# Patient Record
Sex: Female | Born: 1950
Health system: Southern US, Community
[De-identification: ages and names within clinical notes are randomized; demographics above are authoritative.]

## PROBLEM LIST (undated history)

## (undated) DIAGNOSIS — K219 Gastro-esophageal reflux disease without esophagitis: Secondary | ICD-10-CM

## (undated) DIAGNOSIS — M1990 Unspecified osteoarthritis, unspecified site: Secondary | ICD-10-CM

## (undated) DIAGNOSIS — I219 Acute myocardial infarction, unspecified: Secondary | ICD-10-CM

## (undated) DIAGNOSIS — N029 Recurrent and persistent hematuria with unspecified morphologic changes: Secondary | ICD-10-CM

## (undated) DIAGNOSIS — G43909 Migraine, unspecified, not intractable, without status migrainosus: Secondary | ICD-10-CM

## (undated) HISTORY — DX: Acute myocardial infarction, unspecified: I21.9

## (undated) HISTORY — PX: ABDOMINAL HYSTERECTOMY: SHX81

## (undated) HISTORY — DX: Gastro-esophageal reflux disease without esophagitis: K21.9

## (undated) HISTORY — DX: Unspecified osteoarthritis, unspecified site: M19.90

## (undated) HISTORY — PX: APPENDECTOMY: SHX54

---

## 1997-12-11 ENCOUNTER — Ambulatory Visit (HOSPITAL_COMMUNITY): Admission: RE | Admit: 1997-12-11 | Discharge: 1997-12-11 | Payer: Self-pay | Admitting: Urology

## 1999-01-22 ENCOUNTER — Other Ambulatory Visit: Admission: RE | Admit: 1999-01-22 | Discharge: 1999-01-22 | Payer: Self-pay | Admitting: Obstetrics and Gynecology

## 2000-03-30 ENCOUNTER — Other Ambulatory Visit: Admission: RE | Admit: 2000-03-30 | Discharge: 2000-03-30 | Payer: Self-pay | Admitting: Obstetrics and Gynecology

## 2001-04-25 ENCOUNTER — Other Ambulatory Visit: Admission: RE | Admit: 2001-04-25 | Discharge: 2001-04-25 | Payer: Self-pay | Admitting: *Deleted

## 2002-04-17 ENCOUNTER — Other Ambulatory Visit: Admission: RE | Admit: 2002-04-17 | Discharge: 2002-04-17 | Payer: Self-pay | Admitting: *Deleted

## 2002-04-30 ENCOUNTER — Encounter: Payer: Self-pay | Admitting: *Deleted

## 2002-04-30 ENCOUNTER — Encounter: Admission: RE | Admit: 2002-04-30 | Discharge: 2002-04-30 | Payer: Self-pay | Admitting: *Deleted

## 2002-06-25 ENCOUNTER — Ambulatory Visit (HOSPITAL_COMMUNITY): Admission: RE | Admit: 2002-06-25 | Discharge: 2002-06-25 | Payer: Self-pay | Admitting: Gastroenterology

## 2007-03-23 ENCOUNTER — Ambulatory Visit (HOSPITAL_COMMUNITY): Admission: RE | Admit: 2007-03-23 | Discharge: 2007-03-23 | Payer: Self-pay | Admitting: Obstetrics and Gynecology

## 2007-05-18 ENCOUNTER — Encounter: Admission: RE | Admit: 2007-05-18 | Discharge: 2007-05-18 | Payer: Self-pay | Admitting: Family Medicine

## 2007-07-20 ENCOUNTER — Ambulatory Visit: Payer: Self-pay | Admitting: Vascular Surgery

## 2007-07-20 ENCOUNTER — Ambulatory Visit: Admission: RE | Admit: 2007-07-20 | Discharge: 2007-07-20 | Payer: Self-pay | Admitting: Family Medicine

## 2007-07-25 ENCOUNTER — Encounter: Payer: Self-pay | Admitting: Family Medicine

## 2008-11-29 ENCOUNTER — Ambulatory Visit (HOSPITAL_COMMUNITY): Admission: RE | Admit: 2008-11-29 | Discharge: 2008-11-29 | Payer: Self-pay | Admitting: Family Medicine

## 2010-10-04 ENCOUNTER — Encounter: Payer: Self-pay | Admitting: Interventional Radiology
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
## 2011-01-29 NOTE — Op Note (Signed)
   NAME:  Lisa, Moore                       ACCOUNT NO.:  [id number]   MEDICAL RECORD NO.:  [id number]                   PATIENT TYPE:  AMB   LOCATION:  ENDO                                 FACILITY:  [facility]   PHYSICIAN:  Danise Edge, M.D.                DATE OF BIRTH:  1951/07/30   DATE OF PROCEDURE:  06/25/2002  DATE OF DISCHARGE:                                 OPERATIVE REPORT   PROCEDURE:  Screening colonoscopy.   INDICATIONS:  The patient is a 60 year old female born [birth year].  The patient  is scheduled to undergo her first screening colonoscopy with polypectomy to  prevent colon cancer.  I discussed with her the complications associated  with colonoscopy and polypectomy, including a 15 per thousand risk of  bleeding and four per thousand risk of colon perforation requiring surgical  repair.  The patient has signed the operative permit.   ENDOSCOPIST:  Danise Edge, M.D.   PREMEDICATION:  Versed 7 mg, Demerol 50 mg.   ENDOSCOPE:  Olympus pediatric colonoscope.   DESCRIPTION OF PROCEDURE:  After obtaining informed consent, the patient was  placed in the left lateral decubitus position.  I administered intravenous  Demerol and intravenous Versed to achieve conscious sedation for the  procedure.  The patient's blood pressure, oxygen saturation, and cardiac  rhythm were monitored throughout the procedure and documented in the medical  record.   Anal inspection was normal.  Digital rectal inspection was normal.  The  Olympus pediatric video colonoscope was introduced into the rectum and  advanced to the cecum.  Colonic preparation for the exam today was  satisfactory.   Rectum normal.   Sigmoid colon and descending colon normal.   Splenic flexure normal.   Transverse colon normal.   Hepatic flexure normal.   Ascending colon normal.   Cecum and ileocecal valve normal.   ASSESSMENT:  Normal screening proctocolonoscopy to the cecum.                               Danise Edge, M.D.    MJ/MEDQ  D:  06/25/2002  T:  06/25/2002  Job:  [id number]   cc:   Armstead Peaks, M.D.  [address] [place], [place] [address]  Fax: [phone]

## 2011-09-17 ENCOUNTER — Other Ambulatory Visit: Payer: Self-pay | Admitting: Family Medicine

## 2011-09-17 DIAGNOSIS — M549 Dorsalgia, unspecified: Secondary | ICD-10-CM

## 2011-09-20 ENCOUNTER — Ambulatory Visit
Admission: RE | Admit: 2011-09-20 | Discharge: 2011-09-20 | Disposition: A | Discharge status: Home / Self Care | Payer: 59 | Source: Ambulatory Visit | Attending: Family Medicine | Admitting: Family Medicine

## 2011-09-20 DIAGNOSIS — M549 Dorsalgia, unspecified: Secondary | ICD-10-CM

## 2011-09-20 MED ORDER — METHYLPREDNISOLONE ACETATE 40 MG/ML INJ SUSP (RADIOLOG
120.0000 mg | Freq: Once | INTRAMUSCULAR | Status: AC
  Administered 2011-09-20: 120 mg via EPIDURAL

## 2011-09-20 MED ORDER — IOHEXOL 180 MG/ML  SOLN
1.0000 mL | Freq: Once | INTRAMUSCULAR | Status: AC | PRN
  Administered 2011-09-20: 1 mL via EPIDURAL

## 2014-01-31 ENCOUNTER — Emergency Department (HOSPITAL_BASED_OUTPATIENT_CLINIC_OR_DEPARTMENT_OTHER): Payer: 59

## 2014-01-31 ENCOUNTER — Emergency Department (HOSPITAL_BASED_OUTPATIENT_CLINIC_OR_DEPARTMENT_OTHER)
Admission: EM | Admit: 2014-01-31 | Discharge: 2014-01-31 | Disposition: A | Discharge status: Home / Self Care | Payer: 59 | Attending: Emergency Medicine | Admitting: Emergency Medicine

## 2014-01-31 ENCOUNTER — Emergency Department: Payer: 59

## 2014-01-31 ENCOUNTER — Encounter (HOSPITAL_BASED_OUTPATIENT_CLINIC_OR_DEPARTMENT_OTHER): Payer: Self-pay | Admitting: Emergency Medicine

## 2014-01-31 DIAGNOSIS — R079 Chest pain, unspecified: Secondary | ICD-10-CM

## 2014-01-31 DIAGNOSIS — R0789 Other chest pain: Secondary | ICD-10-CM | POA: Insufficient documentation

## 2014-01-31 DIAGNOSIS — R319 Hematuria, unspecified: Secondary | ICD-10-CM | POA: Insufficient documentation

## 2014-01-31 DIAGNOSIS — R071 Chest pain on breathing: Secondary | ICD-10-CM | POA: Insufficient documentation

## 2014-01-31 DIAGNOSIS — R1011 Right upper quadrant pain: Secondary | ICD-10-CM | POA: Insufficient documentation

## 2014-01-31 HISTORY — DX: Recurrent and persistent hematuria with unspecified morphologic changes: N02.9

## 2014-01-31 LAB — URINALYSIS, ROUTINE W REFLEX MICROSCOPIC
BILIRUBIN URINE: NEGATIVE
Glucose, UA: NEGATIVE mg/dL
Ketones, ur: NEGATIVE mg/dL
Nitrite: NEGATIVE
PROTEIN: NEGATIVE mg/dL
Specific Gravity, Urine: 1.022 (ref 1.005–1.030)
UROBILINOGEN UA: 1 mg/dL (ref 0.0–1.0)
pH: 6 (ref 5.0–8.0)

## 2014-01-31 LAB — CBC WITH DIFFERENTIAL/PLATELET
BASOS ABS: 0 10*3/uL (ref 0.0–0.1)
Basophils Relative: 0 % (ref 0–1)
Eosinophils Absolute: 0.1 10*3/uL (ref 0.0–0.7)
Eosinophils Relative: 1 % (ref 0–5)
HEMATOCRIT: 40.5 % (ref 36.0–46.0)
HEMOGLOBIN: 13.9 g/dL (ref 12.0–15.0)
LYMPHS PCT: 36 % (ref 12–46)
Lymphs Abs: 3.2 10*3/uL (ref 0.7–4.0)
MCH: 31 pg (ref 26.0–34.0)
MCHC: 34.3 g/dL (ref 30.0–36.0)
MCV: 90.4 fL (ref 78.0–100.0)
MONO ABS: 0.7 10*3/uL (ref 0.1–1.0)
MONOS PCT: 7 % (ref 3–12)
NEUTROS ABS: 5 10*3/uL (ref 1.7–7.7)
Neutrophils Relative %: 56 % (ref 43–77)
Platelets: 275 10*3/uL (ref 150–400)
RBC: 4.48 MIL/uL (ref 3.87–5.11)
RDW: 13.1 % (ref 11.5–15.5)
WBC: 8.9 10*3/uL (ref 4.0–10.5)

## 2014-01-31 LAB — COMPREHENSIVE METABOLIC PANEL
ALK PHOS: 72 U/L (ref 39–117)
ALT: 11 U/L (ref 0–35)
AST: 16 U/L (ref 0–37)
Albumin: 3.7 g/dL (ref 3.5–5.2)
BILIRUBIN TOTAL: 0.5 mg/dL (ref 0.3–1.2)
BUN: 17 mg/dL (ref 6–23)
CHLORIDE: 106 meq/L (ref 96–112)
CO2: 24 meq/L (ref 19–32)
CREATININE: 0.8 mg/dL (ref 0.50–1.10)
Calcium: 9.3 mg/dL (ref 8.4–10.5)
GFR calc Af Amer: 88 mL/min — ABNORMAL LOW (ref >90)
GFR, EST NON AFRICAN AMERICAN: 76 mL/min — AB (ref >90)
Glucose, Bld: 113 mg/dL — ABNORMAL HIGH (ref 70–99)
POTASSIUM: 3.6 meq/L — AB (ref 3.7–5.3)
Sodium: 143 mEq/L (ref 137–147)
Total Protein: 7 g/dL (ref 6.0–8.3)

## 2014-01-31 LAB — URINE MICROSCOPIC-ADD ON

## 2014-01-31 LAB — TROPONIN I: Troponin I: <0.3 ng/mL (ref <0.30)

## 2014-01-31 NOTE — Discharge Instructions (Signed)

## 2014-01-31 NOTE — ED Provider Notes (Addendum)
CSN: [id number]     Arrival date & time 01/31/14  1239 History   First MD Initiated Contact with Patient 01/31/14 1326     Chief Complaint  Patient presents with  . Emesis     (Consider location/radiation/quality/duration/timing/severity/associated sxs/prior Treatment) Patient is a 63 y.o. female presenting with vomiting. The history is provided by the patient.  Emesis Severity:  Mild Duration:  3 hours Timing:  Rare Number of daily episodes:  1 Quality:  Stomach contents Progression:  Resolved Chronicity:  New Recent urination:  Normal Relieved by:  None tried Worsened by:  Nothing tried Ineffective treatments:  None tried Associated symptoms: no chills, no cough, no diarrhea, no fever, no myalgias and no sore throat   Associated symptoms comment:  Pain in the RUQ/flank and lower chest wall that started abruptly at 10:30 and lasted about was sharp in nature caused 1 episode of vomiting and then resolved. Risk factors: no alcohol use, no diabetes, no prior abdominal surgery and no sick contacts     Past Medical History  Diagnosis Date  . Benign hematuria    Past Surgical History  Procedure Laterality Date  . Abdominal hysterectomy    . Appendectomy     No family history on file. History  Substance Use Topics  . Smoking status: Never Smoker   . Smokeless tobacco: Never Used  . Alcohol Use: No   OB History   Grav Para Term Preterm Abortions TAB SAB Ect Mult Living                 Review of Systems  Constitutional: Negative for chills.  HENT: Negative for sore throat.   Gastrointestinal: Positive for vomiting. Negative for diarrhea.  Musculoskeletal: Negative for myalgias.  All other systems reviewed and are negative.     Allergies  Review of patient's allergies indicates no known allergies.  Home Medications   Prior to Admission medications   Not on File   BP 142/84  Pulse 79  Temp(Src) 98.9 F (37.2 C) (Oral)  Resp 20  Ht 5\' 6"  (1.676 m)   Wt 245 lb (111.131 kg)  BMI 39.56 kg/m2  SpO2 98% Physical Exam  Nursing note and vitals reviewed. Constitutional: She is oriented to person, place, and time. She appears well-developed and well-nourished. No distress.  HENT:  Head: Normocephalic and atraumatic.  Mouth/Throat: Oropharynx is clear and moist.  Eyes: Conjunctivae and EOM are normal. Pupils are equal, round, and reactive to light.  Neck: Normal range of motion. Neck supple.  Cardiovascular: Normal rate, regular rhythm and intact distal pulses.   No murmur heard. Pulmonary/Chest: Effort normal and breath sounds normal. No respiratory distress. She has no wheezes. She has no rales. She exhibits no tenderness.  Abdominal: Soft. Normal appearance. She exhibits no distension. There is no tenderness. There is no rebound, no guarding and no CVA tenderness.  Musculoskeletal: Normal range of motion. She exhibits no edema and no tenderness.  Neurological: She is alert and oriented to person, place, and time.  Skin: Skin is warm and dry. No rash noted. No erythema.  Psychiatric: She has a normal mood and affect. Her behavior is normal.    ED Course  Procedures (including critical care time) Labs Review Labs Reviewed  URINALYSIS, ROUTINE W REFLEX MICROSCOPIC - Abnormal; Notable for the following:    Hgb urine dipstick LARGE (*)    Leukocytes, UA SMALL (*)    All other components within normal limits  COMPREHENSIVE METABOLIC PANEL - Abnormal;  Notable for the following:    Potassium 3.6 (*)    Glucose, Bld 113 (*)    GFR calc non Af Amer 76 (*)    GFR calc Af Amer 88 (*)    All other components within normal limits  URINE MICROSCOPIC-ADD ON  CBC WITH DIFFERENTIAL  TROPONIN I    Imaging Review Dg Chest 2 View  01/31/2014   CLINICAL DATA:  Chest pain.  EXAM: CHEST  2 VIEW  COMPARISON:  None.  FINDINGS: The heart size and mediastinal contours are within normal limits. Both lungs are clear. The visualized skeletal structures are  unremarkable.  IMPRESSION: Normal chest.   Electronically Signed   By: Geanie CooleyJim  Maxwell M.D.   On: 01/31/2014 14:54   [place] Abdomen Complete  01/31/2014   CLINICAL DATA:  Right upper quadrant pain with nausea and vomiting.  EXAM: ULTRASOUND ABDOMEN COMPLETE  COMPARISON:  None.  FINDINGS: Gallbladder:  No gallstones or wall thickening visualized. No sonographic Murphy sign noted.  Common bile duct:  Diameter: 3.5 mm, normal.  Liver:  No focal lesion identified. Within normal limits in parenchymal echogenicity.  IVC:  No abnormality visualized.  Pancreas:  Normal.  Spleen:  9.0 cm, normal.  Right Kidney:  Length: 10.1 cm. Normal echogenicity of the renal parenchyma. There are 2 cysts in the lateral aspect of the mid right kidney, 1 measuring 2.7 cm and the other being 3.6 cm. No hydronephrosis or solid mass.  Left Kidney:  Length: 11.4 cm. 2.4 cm hyperechoic mass in the upper pole of the left kidney. There was a fat density 16 mm lesion in that area on the CT scan of 11/26/2005. This probably represents an angio myolipoma. Renal parenchyma is otherwise normal. No hydronephrosis visualized.  Abdominal aorta:  Normal.  2.5 cm diameter.  Other findings:  None.  IMPRESSION: 1. No acute abnormalities. 2. Probable angiomyolipoma of the left kidney in the upper pole, increased from 1.6 cm to 2.4 cm since 2007.   Electronically Signed   By: Geanie CooleyJim  Maxwell M.D.   On: 01/31/2014 14:41     EKG Interpretation   Date/Time:  Thursday Jan 31 2014 14:29:09 EDT Ventricular Rate:  62 PR Interval:  164 QRS Duration: 74 QT Interval:  416 QTC Calculation: 422 R Axis:   44 Text Interpretation:  Normal sinus rhythm Low voltage QRS Otherwise within  normal limits No previous tracing Confirmed by Anitra LauthPLUNKETT  MD, Alphonzo LemmingsWHITNEY  [phone]([id number]) on 01/31/2014 2:41:52 PM      MDM   Final diagnoses:  Atypical chest pain    Patient presents after an abrupt onset of pain that lasted for approximately 10 minutes that was sharp in nature caused  episode of vomiting and has now resolved. The pain is located in the right upper quadrant/flank/lower chest area. It did not radiate she's never had symptoms like this before. Low Suspicion for cardiac etiology at this time as patient has no risk factors. She has no risk factors for PE and low suspicion based on her story. Possibility for gallbladder pathology versus kidney stone. Patient does have a small amount of blood in her urine and denies any vaginal bleeding.  Right upper quadrant ultrasound to evaluate the gallbladder the kidneys, chest x-ray, CBC, CMP, troponin pending. The patient at length over 3 hours after symptoms had occurred so troponin is negative for the patient adequately ruled out given her heart score of 0  Imaging and labs wnl. Pt continues to feel her baseline.  She states  she has chronic microscopic hematuria that has been evaluated by cystoscopy in the past with inconclusive resolved.  Will d/c pt home at this time.  Gwyneth SproutWhitney Balbina Depace, MD 01/31/14 1504  Gwyneth SproutWhitney Marija Calamari, MD 01/31/14 1505

## 2014-01-31 NOTE — ED Notes (Signed)
Patient states she was at work and had a sudden onset on pain in her right upper side and under her right breast.  States she suddenly became nauseated and vomited x 1.  States she immediately felt better.  Pain and nausea have subsided.  Now has a headache.  EMS was called and an ekg was done.

## 2016-08-16 ENCOUNTER — Other Ambulatory Visit: Payer: Self-pay | Admitting: Family

## 2016-08-16 DIAGNOSIS — E2839 Other primary ovarian failure: Secondary | ICD-10-CM

## 2016-09-03 ENCOUNTER — Emergency Department (HOSPITAL_COMMUNITY): Payer: Medicare Other

## 2016-09-03 ENCOUNTER — Encounter (HOSPITAL_COMMUNITY): Payer: Self-pay | Admitting: Emergency Medicine

## 2016-09-03 ENCOUNTER — Emergency Department (HOSPITAL_COMMUNITY)
Admission: EM | Admit: 2016-09-03 | Discharge: 2016-09-03 | Disposition: A | Discharge status: Home / Self Care | Payer: Medicare Other | Attending: Emergency Medicine | Admitting: Emergency Medicine

## 2016-09-03 DIAGNOSIS — Z79899 Other long term (current) drug therapy: Secondary | ICD-10-CM | POA: Insufficient documentation

## 2016-09-03 DIAGNOSIS — R1013 Epigastric pain: Secondary | ICD-10-CM

## 2016-09-03 DIAGNOSIS — R110 Nausea: Secondary | ICD-10-CM | POA: Diagnosis not present

## 2016-09-03 DIAGNOSIS — R1011 Right upper quadrant pain: Secondary | ICD-10-CM | POA: Diagnosis present

## 2016-09-03 DIAGNOSIS — R109 Unspecified abdominal pain: Secondary | ICD-10-CM

## 2016-09-03 LAB — URINALYSIS, ROUTINE W REFLEX MICROSCOPIC
GLUCOSE, UA: NEGATIVE mg/dL
Ketones, ur: 5 mg/dL — AB
NITRITE: NEGATIVE
PH: 5 (ref 5.0–8.0)
Protein, ur: 30 mg/dL — AB
SPECIFIC GRAVITY, URINE: 1.029 (ref 1.005–1.030)

## 2016-09-03 LAB — COMPREHENSIVE METABOLIC PANEL
ALBUMIN: 4.7 g/dL (ref 3.5–5.0)
ALK PHOS: 76 U/L (ref 38–126)
ALT: 14 U/L (ref 14–54)
ANION GAP: 10 (ref 5–15)
AST: 23 U/L (ref 15–41)
BILIRUBIN TOTAL: 0.7 mg/dL (ref 0.3–1.2)
BUN: 30 mg/dL — ABNORMAL HIGH (ref 6–20)
CALCIUM: 9.5 mg/dL (ref 8.9–10.3)
CO2: 21 mmol/L — AB (ref 22–32)
CREATININE: 0.89 mg/dL (ref 0.44–1.00)
Chloride: 108 mmol/L (ref 101–111)
GFR calc non Af Amer: >60 mL/min (ref >60)
GLUCOSE: 108 mg/dL — AB (ref 65–99)
Potassium: 3.9 mmol/L (ref 3.5–5.1)
Sodium: 139 mmol/L (ref 135–145)
TOTAL PROTEIN: 7.7 g/dL (ref 6.5–8.1)

## 2016-09-03 LAB — CBC
HCT: 42.3 % (ref 36.0–46.0)
HEMOGLOBIN: 14.3 g/dL (ref 12.0–15.0)
MCH: 29.5 pg (ref 26.0–34.0)
MCHC: 33.8 g/dL (ref 30.0–36.0)
MCV: 87.4 fL (ref 78.0–100.0)
PLATELETS: 302 10*3/uL (ref 150–400)
RBC: 4.84 MIL/uL (ref 3.87–5.11)
RDW: 13.8 % (ref 11.5–15.5)
WBC: 7.2 10*3/uL (ref 4.0–10.5)

## 2016-09-03 LAB — TROPONIN I: Troponin I: <0.03 ng/mL (ref <0.03)

## 2016-09-03 LAB — LIPASE, BLOOD: Lipase: 23 U/L (ref 11–51)

## 2016-09-03 MED ORDER — RANITIDINE HCL 150 MG PO TABS: 150.0000 mg | ORAL_TABLET | Freq: Two times a day (BID) | ORAL | 0 refills | Status: DC

## 2016-09-03 MED ORDER — SUCRALFATE 1 GM/10ML PO SUSP: 1.0000 g | Freq: Three times a day (TID) | ORAL | 0 refills | Status: DC

## 2016-09-03 NOTE — Discharge Instructions (Signed)
Please read the attached information on "abdominal pain in adults", "fatty liver" and "nonalcoholic fatty liver disease diet".   Your ultrasound was negative for gallbladder stones and gallbladder inflammation or infection. As we discussed there was an area of increased echogenicity in the liver adjacent to the gallbladder found on the ultrasound.  Please discuss these results with your primary care provider or with the GI provider you follow up with.   The ultrasound report is attached to your discharge paperwork.   You have been prescribed zantac and carafate suspension, these medication may decrease reflux and alleviate discomfort.  Take these as prescribed.    Return to the emergency department as needed, if you develop vomiting, bloody stools or worsening abdominal pain.

## 2016-09-03 NOTE — ED Triage Notes (Signed)
Pt reports R flank/RUQ abd pain for the past month that has gradually gotten worse. Still has gallbladder. No v/d or dysuria.

## 2016-09-03 NOTE — ED Notes (Signed)
US at bedside

## 2016-09-03 NOTE — ED Provider Notes (Signed)
[facility]-EMERGENCY DEPT Provider Note   CSN: [id number] Arrival date & time: 09/03/16  1256     History   Chief Complaint Chief Complaint  Patient presents with  . Abdominal Pain    HPI Lisa Moore is a 65 y.o. female with h/o reflux presents with R upper abdominal pain that radiates to the R shoulder blade and R chest x 1 week.  Pt reports associated nausea and constipation.  Pt states abdominal pain is worse at night and after fatty meals.  Pt states pain has woken her up in the middle of the night.  Pt reports reflux has gotten worse recently.  Pt denies shortness of breath, cough, upper respiratory symptoms.  Pt denies vomiting, diarrhea, fevers, recent unexpected weight loss.  Pt denies heart disease or recent lower extremity swelling.  Pt denies h/o PUD, pancreatitis, kidney stones.  Pt denies heavy alcohol intake or ibuprofen overuse.   HPI  Past Medical History:  Diagnosis Date  . Benign hematuria     There are no active problems to display for this patient.   Past Surgical History:  Procedure Laterality Date  . ABDOMINAL HYSTERECTOMY    . APPENDECTOMY      OB History    No data available       Home Medications    Prior to Admission medications   Medication Sig Start Date End Date Taking? Authorizing Provider  naproxen sodium (ANAPROX) 220 MG tablet Take 660 mg by mouth 2 (two) times daily as needed (for pain).   Yes Historical Provider, MD  ranitidine (ZANTAC) 150 MG tablet Take 1 tablet (150 mg total) by mouth 2 (two) times daily. 09/03/16   Liberty Handylaudia J Shandell Jallow, PA-C  sucralfate (CARAFATE) 1 GM/10ML suspension Take 10 mLs (1 g total) by mouth 4 (four) times daily -  with meals and at bedtime. 09/03/16   Liberty Handylaudia J Melisa Donofrio, PA-C    Family History History reviewed. No pertinent family history.  Social History Social History  Substance Use Topics  . Smoking status: Never Smoker  . Smokeless tobacco: Never Used  . Alcohol use No     Allergies   Patient  has no known allergies.   Review of Systems Review of Systems  Constitutional: Negative for fever.  HENT: Negative for congestion and rhinorrhea.   Eyes: Negative for visual disturbance.  Respiratory: Negative for cough, choking, chest tightness and shortness of breath.   Cardiovascular: Positive for chest pain. Negative for palpitations and leg swelling.  Gastrointestinal: Positive for abdominal pain, constipation and nausea. Negative for anal bleeding, blood in stool, diarrhea and vomiting.  Genitourinary: Negative for difficulty urinating.  Musculoskeletal: Negative for arthralgias.  Skin: Negative for rash.  Neurological: Negative for dizziness, weakness, light-headedness and headaches.  Hematological: Negative.   Psychiatric/Behavioral: Negative.      Physical Exam Updated Vital Signs BP 141/83 (BP Location: Left Arm)   Pulse 63   Temp 97.9 F (36.6 C) (Oral)   Resp 16   Ht 5\' 5"  (1.651 m)   Wt 103.9 kg   SpO2 96%   BMI 38.11 kg/m   Physical Exam  Constitutional: She is oriented to person, place, and time. Vital signs are normal. She appears well-developed and well-nourished. No distress.  HENT:  Head: Normocephalic and atraumatic.  Nose: Nose normal.  Mouth/Throat: Oropharynx is clear and moist. No oropharyngeal exudate.  Eyes: Conjunctivae and EOM are normal. Pupils are equal, round, and reactive to light.  Neck: Normal range of motion. Neck supple.  No JVD present.  Cardiovascular: Normal rate, regular rhythm, normal heart sounds and intact distal pulses.   No murmur heard. Pulmonary/Chest: Effort normal and breath sounds normal. She has no wheezes.  Abdominal:  Abdomen is soft, non tender, non distended, without guarding or rigidity.  Mild epigastric tenderness.  Negative Murphy's.  Negative McMurphy's.  Open appendectomy surgical scar.  No pulsating abdominal masses. No suprapubic tenderness. No CVAT bilaterally.   Musculoskeletal: Normal range of motion.    Lymphadenopathy:    She has no cervical adenopathy.  Neurological: She is alert and oriented to person, place, and time.  Skin: Skin is warm and dry. Capillary refill takes less than 2 seconds.  Psychiatric: She has a normal mood and affect. Her behavior is normal.  Nursing note and vitals reviewed.    ED Treatments / Results  Labs (all labs ordered are listed, but only abnormal results are displayed) Labs Reviewed  COMPREHENSIVE METABOLIC PANEL - Abnormal; Notable for the following:       Result Value   CO2 21 (*)    Glucose, Bld 108 (*)    BUN 30 (*)    All other components within normal limits  URINALYSIS, ROUTINE W REFLEX MICROSCOPIC - Abnormal; Notable for the following:    Color, Urine AMBER (*)    APPearance HAZY (*)    Hgb urine dipstick MODERATE (*)    Bilirubin Urine SMALL (*)    Ketones, ur 5 (*)    Protein, ur 30 (*)    Leukocytes, UA MODERATE (*)    Bacteria, UA RARE (*)    Squamous Epithelial / LPF 6-30 (*)    All other components within normal limits  LIPASE, BLOOD  CBC  TROPONIN I    EKG  EKG Interpretation None       Radiology Dg Chest 2 View  Result Date: 09/03/2016 CLINICAL DATA:  Pain under RIGHT breast and upper posterior shoulder pain recurring for 1 week, vague upper abdominal pain EXAM: CHEST  2 VIEW COMPARISON:  01/31/2014 FINDINGS: Normal heart size and pulmonary vascularity. Tortuous descending thoracic aorta. Lungs clear. No pleural effusion or pneumothorax. Osseous structures demineralized without acute abnormality. IMPRESSION: No acute abnormalities. Electronically Signed   By: Ulyses SouthwardMark  Boles M.D.   On: 09/03/2016 17:15   Koreas Abdomen Limited Ruq  Result Date: 09/03/2016 CLINICAL DATA:  Right upper quadrant pain for 1 week. EXAM: US ABDOMEN LIMITED - RIGHT UPPER QUADRANT COMPARISON:  01/31/2014 FINDINGS: Gallbladder: No gallstones or wall thickening visualized. No sonographic Murphy sign noted by sonographer. Common bile duct: Diameter:  2.8 mm Liver: Diffusely increased echogenicity of the liver with focal area of hypoattenuation adjacent to the gallbladder fossa, ill-defined. Benign-appearing right renal cysts. IMPRESSION: No evidence of cholelithiasis or acute cholecystitis. Diffusely increased echogenicity of the liver with focal area of hypoattenuation adjacent to gallbladder fossa. This may represent an area of fatty sparing in the background of hepatic steatosis or potentially a hepatic mass. Please correlate clinically. Electronically Signed   By: Ted Mcalpineobrinka  Dimitrova M.D.   On: 09/03/2016 16:51    Procedures Procedures (including critical care time)  Medications Ordered in ED Medications - No data to display   Initial Impression / Assessment and Plan / ED Course  I have reviewed the triage vital signs and the nursing notes.  Pertinent labs & imaging results that were available during my care of the patient were reviewed by me and considered in my medical decision making (see chart for details).  Clinical Course  as of Sep 04 1755  Fri Sep 03, 2016  1700 Repeat abdominal exam unchanged from initial abdominal exam.   [CG]    Clinical Course User Index [CG] Liberty Handy, PA-C   64 yo female with h/o reflux, obesity and open appendectomy presents with R upper quadrant abdominal pain radiating to R shoulder blade worst at night and worst after fatty meals.  No h/o PUD, alcohol intake, bloody stools.  On exam pt abdominal exam is unremarkable other than mild epigastric tenderness, negative Murphy's sign.  EKG, troponin, CXR, U/A, CMP, CBC and lipase unremarkable.  RUQ u/a was negative for cholelithiasis or acute cholecystitis, remarkable for increased echogenicity of the liver with focal area of hypoattenuation adjacent to GB fossa may suggest hepatis steatosis or hepatic mass.  I suspect this may be fatty liver.  Mild epigastric tenderness suspicious for reflux, esophagitis or gastritis.  Pt will be discharged with  zantac, carafate and close GI follow up. Discussed ultrasound findings with patient, results printed out and handed to patient prior to discharge.  Pt instructed to avoid alcohol and taking NSAIDs.  Pt verbalized understanding and agreeable to dispo plan.   Final Clinical Impressions(s) / ED Diagnoses   Final diagnoses:  Abdominal pain  Epigastric pain  Nausea    New Prescriptions New Prescriptions   RANITIDINE (ZANTAC) 150 MG TABLET    Take 1 tablet (150 mg total) by mouth 2 (two) times daily.   SUCRALFATE (CARAFATE) 1 GM/10ML SUSPENSION    Take 10 mLs (1 g total) by mouth 4 (four) times daily -  with meals and at bedtime.     Liberty Handy, PA-C 09/03/16 1757    Nira Conn, MD 09/05/16 [phone]

## 2016-09-16 ENCOUNTER — Other Ambulatory Visit: Payer: Self-pay | Admitting: Gastroenterology

## 2016-09-16 DIAGNOSIS — K921 Melena: Secondary | ICD-10-CM | POA: Diagnosis not present

## 2016-09-16 DIAGNOSIS — R109 Unspecified abdominal pain: Secondary | ICD-10-CM | POA: Diagnosis not present

## 2016-09-16 DIAGNOSIS — R935 Abnormal findings on diagnostic imaging of other abdominal regions, including retroperitoneum: Secondary | ICD-10-CM | POA: Diagnosis not present

## 2016-09-24 ENCOUNTER — Ambulatory Visit
Admission: RE | Admit: 2016-09-24 | Discharge: 2016-09-24 | Disposition: A | Discharge status: Home / Self Care | Payer: Medicare Other | Source: Ambulatory Visit | Attending: Gastroenterology | Admitting: Gastroenterology

## 2016-09-24 DIAGNOSIS — K7689 Other specified diseases of liver: Secondary | ICD-10-CM | POA: Diagnosis not present

## 2016-09-24 DIAGNOSIS — R935 Abnormal findings on diagnostic imaging of other abdominal regions, including retroperitoneum: Secondary | ICD-10-CM

## 2016-09-24 MED ORDER — GADOBENATE DIMEGLUMINE 529 MG/ML IV SOLN
20.0000 mL | Freq: Once | INTRAVENOUS | Status: AC | PRN
  Administered 2016-09-24: 20 mL via INTRAVENOUS

## 2016-10-28 DIAGNOSIS — K921 Melena: Secondary | ICD-10-CM | POA: Diagnosis not present

## 2016-10-28 DIAGNOSIS — K219 Gastro-esophageal reflux disease without esophagitis: Secondary | ICD-10-CM | POA: Diagnosis not present

## 2016-10-28 DIAGNOSIS — R935 Abnormal findings on diagnostic imaging of other abdominal regions, including retroperitoneum: Secondary | ICD-10-CM | POA: Diagnosis not present

## 2017-02-01 DIAGNOSIS — L255 Unspecified contact dermatitis due to plants, except food: Secondary | ICD-10-CM | POA: Diagnosis not present

## 2017-03-25 DIAGNOSIS — H40013 Open angle with borderline findings, low risk, bilateral: Secondary | ICD-10-CM | POA: Diagnosis not present

## 2017-03-25 DIAGNOSIS — H2513 Age-related nuclear cataract, bilateral: Secondary | ICD-10-CM | POA: Diagnosis not present

## 2017-06-28 DIAGNOSIS — Z23 Encounter for immunization: Secondary | ICD-10-CM | POA: Diagnosis not present

## 2018-03-08 NOTE — Progress Notes (Signed)
Lisa Moore has just [facility] [employer] [facility] [employer] [facility] [facility] weeks [facility].  She has been meeting with a wellness trainer and was told her BP was running a "little high" and to have it manually checked with me.  She states she takes no medications and hasn't had a physical exam in some time and doesn't really have a PMD anymore since her's left the practice she was at..  She is recently retired from [employer] where she was getting her mammograms done and hasn't really thought about one since then.  It was recommended that she establish a PMD and consider having her yearly physicals.  I have given her information on [facility] practices close to her and she states she will be monitoring her BP and watch her diet for hidden sodium as well as make a new patient appointment.

## 2018-03-31 ENCOUNTER — Encounter: Payer: Self-pay | Admitting: Family Medicine

## 2018-03-31 ENCOUNTER — Ambulatory Visit (INDEPENDENT_AMBULATORY_CARE_PROVIDER_SITE_OTHER): Payer: Medicare Other | Admitting: Family Medicine

## 2018-03-31 ENCOUNTER — Other Ambulatory Visit: Payer: Self-pay | Admitting: Family Medicine

## 2018-03-31 VITALS — BP 134/90 | HR 66 | Temp 97.6°F | Ht 65.0 in | Wt 205.2 lb

## 2018-03-31 DIAGNOSIS — R030 Elevated blood-pressure reading, without diagnosis of hypertension: Secondary | ICD-10-CM

## 2018-03-31 DIAGNOSIS — Z1159 Encounter for screening for other viral diseases: Secondary | ICD-10-CM | POA: Diagnosis not present

## 2018-03-31 DIAGNOSIS — Z131 Encounter for screening for diabetes mellitus: Secondary | ICD-10-CM

## 2018-03-31 DIAGNOSIS — Z136 Encounter for screening for cardiovascular disorders: Secondary | ICD-10-CM | POA: Diagnosis not present

## 2018-03-31 DIAGNOSIS — R5383 Other fatigue: Secondary | ICD-10-CM | POA: Diagnosis not present

## 2018-03-31 DIAGNOSIS — R21 Rash and other nonspecific skin eruption: Secondary | ICD-10-CM | POA: Diagnosis not present

## 2018-03-31 DIAGNOSIS — Z1231 Encounter for screening mammogram for malignant neoplasm of breast: Secondary | ICD-10-CM

## 2018-03-31 DIAGNOSIS — Z0100 Encounter for examination of eyes and vision without abnormal findings: Secondary | ICD-10-CM

## 2018-03-31 DIAGNOSIS — Z23 Encounter for immunization: Secondary | ICD-10-CM

## 2018-03-31 DIAGNOSIS — Z0000 Encounter for general adult medical examination without abnormal findings: Secondary | ICD-10-CM | POA: Diagnosis not present

## 2018-03-31 DIAGNOSIS — Z1239 Encounter for other screening for malignant neoplasm of breast: Secondary | ICD-10-CM

## 2018-03-31 DIAGNOSIS — K219 Gastro-esophageal reflux disease without esophagitis: Secondary | ICD-10-CM | POA: Insufficient documentation

## 2018-03-31 LAB — COMPREHENSIVE METABOLIC PANEL
ALT: 15 U/L (ref 0–35)
AST: 15 U/L (ref 0–37)
Albumin: 4.3 g/dL (ref 3.5–5.2)
Alkaline Phosphatase: 75 U/L (ref 39–117)
BILIRUBIN TOTAL: 0.6 mg/dL (ref 0.2–1.2)
BUN: 22 mg/dL (ref 6–23)
CALCIUM: 9.5 mg/dL (ref 8.4–10.5)
CHLORIDE: 106 meq/L (ref 96–112)
CO2: 23 meq/L (ref 19–32)
Creatinine, Ser: 0.69 mg/dL (ref 0.40–1.20)
GFR: 89.86 mL/min (ref >60.00)
Glucose, Bld: 107 mg/dL — ABNORMAL HIGH (ref 70–99)
POTASSIUM: 4.2 meq/L (ref 3.5–5.1)
Sodium: 140 mEq/L (ref 135–145)
Total Protein: 7.4 g/dL (ref 6.0–8.3)

## 2018-03-31 LAB — CBC WITH DIFFERENTIAL/PLATELET
BASOS ABS: 0 10*3/uL (ref 0.0–0.1)
Basophils Relative: 0.9 % (ref 0.0–3.0)
Eosinophils Absolute: 0.1 10*3/uL (ref 0.0–0.7)
Eosinophils Relative: 1.4 % (ref 0.0–5.0)
HEMATOCRIT: 43.8 % (ref 36.0–46.0)
Hemoglobin: 14.8 g/dL (ref 12.0–15.0)
LYMPHS PCT: 43 % (ref 12.0–46.0)
Lymphs Abs: 2.4 10*3/uL (ref 0.7–4.0)
MCHC: 33.8 g/dL (ref 30.0–36.0)
MCV: 90 fl (ref 78.0–100.0)
MONOS PCT: 6.6 % (ref 3.0–12.0)
Monocytes Absolute: 0.4 10*3/uL (ref 0.1–1.0)
NEUTROS ABS: 2.6 10*3/uL (ref 1.4–7.7)
Neutrophils Relative %: 48.1 % (ref 43.0–77.0)
PLATELETS: 263 10*3/uL (ref 150.0–400.0)
RBC: 4.87 Mil/uL (ref 3.87–5.11)
RDW: 13.3 % (ref 11.5–15.5)
WBC: 5.5 10*3/uL (ref 4.0–10.5)

## 2018-03-31 LAB — TSH: TSH: 1.63 u[IU]/mL (ref 0.35–4.50)

## 2018-03-31 LAB — LIPID PANEL
CHOL/HDL RATIO: 4
Cholesterol: 222 mg/dL — ABNORMAL HIGH (ref 0–200)
HDL: 62.2 mg/dL (ref >39.00)
LDL CALC: 139 mg/dL — AB (ref 0–99)
NonHDL: 159.6
TRIGLYCERIDES: 104 mg/dL (ref 0.0–149.0)
VLDL: 20.8 mg/dL (ref 0.0–40.0)

## 2018-03-31 MED ORDER — TRIAMCINOLONE ACETONIDE 0.1 % EX CREA: TOPICAL_CREAM | CUTANEOUS | 1 refills | Status: DC

## 2018-03-31 NOTE — Patient Instructions (Signed)
Health Maintenance Due  Topic Date Due  . Hepatitis C Screening  06/11/1951  . TETANUS/TDAP  01/03/1969  . COLONOSCOPY  01/04/2000  . MAMMOGRAM  11/30/2010  . DEXA SCAN  01/04/2015  . PNA vac Low Risk Adult (1 of 2 - PCV13) 01/04/2015

## 2018-03-31 NOTE — Progress Notes (Signed)
Phone: [phone]  Subjective:  Patient presents today for their Annual Wellness Visit. Also has some concerns..  Was told her BP was high at the [facility]. Does not know number and sounds like they used a wrist cuff. No h/a, vision changes, chest pain or shortness of breath.   Fatigue: has noticed more fatigue within the past year. She can fall asleep if she sits down. She denies any blood in her stool, night sweats, easy bruising, no family history of colon or breast cancer. She sleeps fine and gets 8 hours at night.   Rash on left arm: She has an area on her left forearm that is raised and rough and itches her. She has had it for at least 2-3 years. It has not spread or changed. She has tried hydrocortisone cream, aveeno and every other anti-itch. Nothing helps. She has tried lotion and cream 2-3x/day. She tries not to scratch it. She has not changed anything in her routine.   Preventive Screening-Counseling & Management  Vision screen: 20/20 both eyes and R/L. Corrected.  No exam data present  Advanced directives: Yes  Smoking Status: Never Smoker Second Hand Smoking status: No smokers in home  Risk Factors Regular exercise: 3-5x/week Diet: normal Fall Risk: None  Fall Risk  03/31/2018  Falls in the past year? No   Opioid use history:  no long term opioids use  Cardiac risk factors:  advanced age (older than 74 for men, 36 for women)  Hyperlipidemia  No diabetes  Family History: + father for diabetes and CAD in father. Grandparents had heart stuff.   Depression Screen None. PHQ2 0  Depression screen PHQ 2/9 03/31/2018  Decreased Interest 0  Down, Depressed, Hopeless 0  PHQ - 2 Score 0    Activities of Daily Living Independent ADLs and IADLs   Hearing Difficulties: none   Cognitive Testing No reported trouble.    Normal 3 word recall MINI - COG 5/5-scanned into chart.   List the Names of Other Physician/Practitioners you currently use:  There is no  immunization history for the selected administration types on file for this patient. Required Immunizations needed today:  pcv13  Screening tests- hep C and pcv 13 today. cscope up to date, needs mmg. Declines dexa.  Health Maintenance Due  Topic Date Due  . Hepatitis C Screening  14-May-1951  .   01/03/1969  . COLONOSCOPY  01/04/2000  . MAMMOGRAM  11/30/2010  . DEXA SCAN  01/04/2015  . PNA vac Low Risk Adult (1 of 2 - PCV13) 01/04/2015    ROS-  Review of Systems  Constitutional: Positive for malaise/fatigue. Negative for chills and fever.  HENT: Negative for hearing loss and sore throat.   Eyes: Negative for blurred vision and double vision.  Respiratory: Negative for cough, shortness of breath and wheezing.   Cardiovascular: Negative for chest pain, palpitations and leg swelling.  Gastrointestinal: Negative for abdominal pain, blood in stool, nausea and vomiting.  Genitourinary: Negative for dysuria and hematuria.  Musculoskeletal: Negative for falls.  Skin: Positive for itching (left forearm ) and rash.  Neurological: Negative for dizziness and weakness.  Psychiatric/Behavioral: Negative for memory loss and suicidal ideas. The patient is not nervous/anxious and does not have insomnia.     The following were reviewed and entered/updated in epic: Past Medical History:  Diagnosis Date  . Arthritis   . Benign hematuria   . GERD (gastroesophageal reflux disease)    Patient Active Problem List   Diagnosis Date Noted  .  GERD (gastroesophageal reflux disease) 03/31/2018   Past Surgical History:  Procedure Laterality Date  . ABDOMINAL HYSTERECTOMY    . APPENDECTOMY      Family History  Problem Relation Age of Onset  . Hypertension Mother   . Cancer Father   . COPD Father   . Diabetes Father   . Cancer Sister   . Kidney disease Maternal Grandmother   . Heart attack Maternal Grandfather   . Cancer Paternal Grandmother   . Heart attack Paternal Grandfather      Medications- reviewed and updated Current Outpatient Medications  Medication Sig Dispense Refill  . pantoprazole (PROTONIX) 40 MG tablet Take 40 mg by mouth daily.    . naproxen sodium (ANAPROX) 220 MG tablet Take 660 mg by mouth 2 (two) times daily as needed (for pain).    . triamcinolone cream (KENALOG) 0.1 % Use twice a day on arm for 10 days then off for 10 days. 80 g 1   No current facility-administered medications for this visit.     Allergies-reviewed and updated No Known Allergies  Social History   Socioeconomic History  . Marital status: Widowed    Spouse name: Not on file  . Number of children: Not on file  . Years of education: Not on file  . Highest education level: Not on file  Occupational History  . Not on file  Social Needs  . Financial resource strain: Not on file  . Food insecurity:    Worry: Not on file    Inability: Not on file  . Transportation needs:    Medical: Not on file    Non-medical: Not on file  Tobacco Use  . Smoking status: Never Smoker  . Smokeless tobacco: Never Used  Substance and Sexual Activity  . Alcohol use: No  . Drug use: No  . Sexual activity: Not Currently  Lifestyle  . Physical activity:    Days per week: Not on file    Minutes per session: Not on file  . Stress: Not on file  Relationships  . Social connections:    Talks on phone: Not on file    Gets together: Not on file    Attends religious service: Not on file    Active member of club or organization: Not on file    Attends meetings of clubs or organizations: Not on file    Relationship status: Not on file  Other Topics Concern  . Not on file  Social History Narrative  . Not on file    Objective: BP 134/90 (BP Location: Left Arm)   Pulse 66   Temp 97.6 F (36.4 C) (Oral)   Ht 5\' 5"  (1.651 m)   Wt 205 lb 3.2 oz (93.1 kg)   SpO2 96%   BMI 34.15 kg/m  Physical Exam  Constitutional: She is oriented to person, place, and time. She appears well-developed  and well-nourished.  HENT:  Right Ear: External ear normal.  Left Ear: External ear normal.  Mouth/Throat: Oropharynx is clear and moist. No oropharyngeal exudate.  TM pearly with light reflex bilaterally   Eyes: Pupils are equal, round, and reactive to light. Conjunctivae and EOM are normal.  Neck: Normal range of motion. Neck supple. No thyromegaly present.  Cardiovascular: Normal rate, regular rhythm, normal heart sounds and intact distal pulses.  Pulmonary/Chest: Effort normal and breath sounds normal.  Abdominal: Soft. Bowel sounds are normal.  Lymphadenopathy:    She has no cervical adenopathy.  Neurological: She is alert and  oriented to person, place, and time. She has normal reflexes. No cranial nerve deficit. Coordination normal.  Skin: Skin is warm and dry. Rash (left forearm there is a raised and rough patch that is slightly hypopigmented. no scaling. ) noted.  Psychiatric: She has a normal mood and affect. Her behavior is normal.  Vitals reviewed.   Assessment/Plan:   AWV completed- discussed recommended screenings anddocumented any personalized health advice and referrals for preventive counseling. See AVS as well which was given to patient. MMG information given to patient. DEXA declined. Have all been normal in past. Requesting records for cscope.   Status of chronic or acute concerns  Fatigue: will check labs. I think she is doing quite well, getting good rest and no signs of depression. May start her on vitamin D.   Rash: dermatitis/eczema. Starting steroid cream BID x 7-10 days then off for 10 days. Recommended good emollient. Let me know if any changes or does not get better.   Elevated BP without diagnosis of HTN: diastolic borderline. Will give her 3 months to work on diet/exercise and weight loss and keep log. See her back in 3 months for recheck.    Return in about 3 months (around 07/01/2018) for blood pressure check .   Lab/Order associations: Need for  prophylactic vaccination and inoculation against cholera alone  Medicare annual wellness visit, subsequent  Need for pneumococcal vaccination - Plan: Pneumococcal conjugate vaccine 13-valent  Encounter for hepatitis C screening test for low risk patient - Plan: Hepatitis C antibody  Screening for ischemic heart disease - Plan: Lipid panel  Screening for diabetes mellitus - Plan: CANCELED: Glucose, random  Encounter for vision screening - Plan: Visual acuity screening, CANCELED: Visual acuity screening  Other fatigue - Plan: Comprehensive metabolic panel, CBC with Differential/Platelet, TSH  Screening for breast cancer - Plan: MS DIGITAL SCREENING TOMO BILATERAL  Meds ordered this encounter  Medications  . triamcinolone cream (KENALOG) 0.1 %    Sig: Use twice a day on arm for 10 days then off for 10 days.    Dispense:  80 g    Refill:  1    Return precautions advised.  Orland Mustard, MD [facility]  03/31/2018

## 2018-03-31 NOTE — Progress Notes (Deleted)
Patient: Lisa Moore MRN: [id number] DOB: [birth year] PCP: Boneta LucksBrown, Jennifer, NP (Inactive)     Subjective:  Chief Complaint  Patient presents with  . Establish Care    requesting cpe    HPI: The patient is a 67 y.o. female who presents today for annual exam. {He/she (caps):30048} denies any changes to past medical history. There have been no recent hospitalizations. They {Actions; are/are not:16769} following a well balanced diet and exercise plan. Weight has been {trend:16658}. No complaints today.    There is no immunization history on file for this patient. Colonoscopy: Mammogram:  Pap smear:  PSA:   Review of Systems  Constitutional: Positive for activity change. Negative for fatigue.       Pt states she is more active and working out more  Respiratory: Negative for shortness of breath.   Cardiovascular: Negative for chest pain.  Gastrointestinal: Negative for constipation, nausea and vomiting.  Musculoskeletal: Negative for back pain and neck pain.  Skin: Positive for rash.       Pt has a rash on left forearm  Neurological: Negative for dizziness and headaches.  Psychiatric/Behavioral: The patient is not nervous/anxious.     Allergies Patient has No Known Allergies.  Past Medical History Patient  has a past medical history of Benign hematuria.  Surgical History Patient  has a past surgical history that includes Abdominal hysterectomy and Appendectomy.  Family History Pateint's family history is not on file.  Social History Patient  reports that she has never smoked. She has never used smokeless tobacco. She reports that she does not drink alcohol or use drugs.    Objective: Vitals:   03/31/18 0811  BP: 130/76  Pulse: 66  Temp: 97.6 F (36.4 C)  TempSrc: Oral  SpO2: 96%  Weight: 205 lb 3.2 oz (93.1 kg)  Height: 5\' 5"  (1.651 m)    Body mass index is 34.15 kg/m.  Physical Exam     Assessment/plan:   No problem-specific Assessment & Plan notes  found for this encounter.    No follow-ups on file.     Orland MustardAllison Daniele Yankowski, MD [facility]  03/31/2018

## 2018-04-01 LAB — HEPATITIS C ANTIBODY
Hepatitis C Ab: NONREACTIVE
SIGNAL TO CUT-OFF: 0.01 (ref <1.00)

## 2018-04-03 ENCOUNTER — Other Ambulatory Visit (INDEPENDENT_AMBULATORY_CARE_PROVIDER_SITE_OTHER): Payer: Medicare Other

## 2018-04-03 ENCOUNTER — Other Ambulatory Visit: Payer: Self-pay | Admitting: Family Medicine

## 2018-04-03 ENCOUNTER — Encounter: Payer: Self-pay | Admitting: Family Medicine

## 2018-04-03 ENCOUNTER — Ambulatory Visit
Admission: RE | Admit: 2018-04-03 | Discharge: 2018-04-03 | Disposition: A | Discharge status: Home / Self Care | Payer: Medicare Other | Source: Ambulatory Visit | Attending: Family Medicine | Admitting: Family Medicine

## 2018-04-03 DIAGNOSIS — Z1231 Encounter for screening mammogram for malignant neoplasm of breast: Secondary | ICD-10-CM

## 2018-04-03 DIAGNOSIS — R7301 Impaired fasting glucose: Secondary | ICD-10-CM

## 2018-04-03 DIAGNOSIS — E785 Hyperlipidemia, unspecified: Secondary | ICD-10-CM | POA: Insufficient documentation

## 2018-04-03 LAB — HEMOGLOBIN A1C: HEMOGLOBIN A1C: 5.4 % (ref 4.6–6.5)

## 2018-04-03 NOTE — Progress Notes (Signed)
Add on a1c for abnormal blood sugar.

## 2018-07-03 ENCOUNTER — Encounter: Payer: Self-pay | Admitting: Family Medicine

## 2018-07-03 ENCOUNTER — Other Ambulatory Visit: Payer: Self-pay

## 2018-07-03 ENCOUNTER — Ambulatory Visit (INDEPENDENT_AMBULATORY_CARE_PROVIDER_SITE_OTHER): Payer: Medicare Other | Admitting: Family Medicine

## 2018-07-03 ENCOUNTER — Telehealth: Payer: Self-pay | Admitting: Family Medicine

## 2018-07-03 VITALS — BP 142/90 | HR 68 | Temp 97.6°F | Ht 65.0 in | Wt 204.0 lb

## 2018-07-03 DIAGNOSIS — Z23 Encounter for immunization: Secondary | ICD-10-CM | POA: Diagnosis not present

## 2018-07-03 DIAGNOSIS — R030 Elevated blood-pressure reading, without diagnosis of hypertension: Secondary | ICD-10-CM | POA: Diagnosis not present

## 2018-07-03 DIAGNOSIS — L309 Dermatitis, unspecified: Secondary | ICD-10-CM

## 2018-07-03 MED ORDER — TRIAMCINOLONE ACETONIDE 0.5 % EX CREA: 1.0000 "application " | TOPICAL_CREAM | Freq: Two times a day (BID) | CUTANEOUS | 1 refills | Status: DC

## 2018-07-03 MED ORDER — GABAPENTIN 300 MG PO CAPS: 300.0000 mg | ORAL_CAPSULE | Freq: Every day | ORAL | 1 refills | Status: DC

## 2018-07-03 NOTE — Progress Notes (Signed)
Patient: Lisa Moore MRN: [id number] DOB: 10/15/1950 PCP: Orland Mustard, MD     Subjective:  Chief Complaint  Patient presents with  . Hypertension    follow up   . discuss increasing dosage of Kenalog cream    HPI: The patient is a 67 y.o. female who presents today for follow up elevated blood pressure.  Would like to discuss increasing dosage of Kenalog cream.   Elevated blood pressure: I saw her 3 months ago and she had elevated blood pressure with no diagnosis of HTN. We were going to give her 3 months to work on diet/exercise. She hasn't really dieted and she thinks she has lost a few pounds. She has been watching sugar and salt intake. She has lost one pound since her last visit.  Has not taken her BP at home or kept a log.   Review of Systems  Constitutional: Negative for fatigue.  Respiratory: Negative for shortness of breath.   Cardiovascular: Negative for chest pain.  Gastrointestinal: Negative for abdominal pain and nausea.  Musculoskeletal: Positive for back pain. Negative for neck pain.  Neurological: Negative for dizziness and headaches.  Psychiatric/Behavioral: Negative for sleep disturbance. The patient is not nervous/anxious.     Allergies Patient has No Known Allergies.  Past Medical History Patient  has a past medical history of Arthritis, Benign hematuria, and GERD (gastroesophageal reflux disease).  Surgical History Patient  has a past surgical history that includes Abdominal hysterectomy and Appendectomy.  Family History Pateint's family history includes COPD in her father; Cancer in her father, paternal grandmother, and sister; Diabetes in her father; Heart attack in her maternal grandfather and paternal grandfather; Hypertension in her mother; Kidney disease in her maternal grandmother.  Social History Patient  reports that she has never smoked. She has never used smokeless tobacco. She reports that she does not drink alcohol or use drugs.     Objective: Vitals:   07/03/18 1001 07/03/18 1025  BP: 136/82 (!) 142/90  Pulse: 68   Temp: 97.6 F (36.4 C)   TempSrc: Oral   SpO2: 97%   Weight: 204 lb (92.5 kg)   Height: 5\' 5"  (1.651 m)     Body mass index is 33.95 kg/m.  Physical Exam  Constitutional: She is oriented to person, place, and time. She appears well-developed and well-nourished.  Neck: Normal range of motion. Neck supple.  Cardiovascular: Normal rate, regular rhythm and normal heart sounds.  Pulmonary/Chest: Effort normal and breath sounds normal.  Abdominal: Soft. Bowel sounds are normal.  Neurological: She is alert and oriented to person, place, and time.  Vitals reviewed.      Assessment/plan: 1. Need for prophylactic vaccination and inoculation against influenza - Flu vaccine HIGH DOSE PF (Fluzone High dose)  2. Elevated blood-pressure reading without diagnosis of hypertension Second reading elevated again. WE discussed this in length. I would like her to keep a home log to better see where she is running on a regular basis. She has a home cuff somewhere and if not, will check it at the [facility] so we can see where she is at. If consistently over 140/90 we need to start medications and she will let me know. Also discussed her ASCVD risk of 8.4%. She is declining medication at this time, but discussed if we start blood pressure medication I would really encourage her to start this as her risk will increase. See let me know by phone or email how her pressures are running.   3. Dermatitis  Increasing kenalog strength. Offered derm referral and she declined as she has had this so long. Discussed if doesn't get better with increased strength would biopsy or send to derm.       Return in about 9 months (around 04/03/2019) for annual or sooner for bp .    Orland Mustard, MD [facility]   07/03/2018

## 2018-07-03 NOTE — Telephone Encounter (Signed)
Copied from CRM [id number]. Topic: Quick Communication - See Telephone Encounter >> Jul 03, 2018  1:03 PM Waymon Amato wrote: Pt is calling to say that she say Dr. Artis Flock this morning and she was supposed to call in kenalog and protonix but only the kenalog is at the pharmacy And they need to confirm that the protonix needs to be called in   Best number [phone]

## 2018-07-03 NOTE — Telephone Encounter (Signed)
Called and spoke with patient.  Actual prescription to be called in was gabapentin for sciatica.    Rx sent to [facility] on [address].

## 2018-11-16 DIAGNOSIS — H2513 Age-related nuclear cataract, bilateral: Secondary | ICD-10-CM | POA: Diagnosis not present

## 2018-11-16 DIAGNOSIS — H04123 Dry eye syndrome of bilateral lacrimal glands: Secondary | ICD-10-CM | POA: Diagnosis not present

## 2018-12-10 ENCOUNTER — Other Ambulatory Visit: Payer: Self-pay | Admitting: Family Medicine

## 2018-12-13 NOTE — Telephone Encounter (Signed)
Per last OV -  3. Dermatitis Increasing kenalog strength. Offered derm referral and she declined as she has had this so long. Discussed if doesn't get better with increased strength would biopsy or send to derm.   Called pt, she reports that sx are relatively the same, not worse not better. With "everything going on right now" she prefers to just have the medication refilled. She doesn't want to come into the office for a biopsy or be referred to dermatology.   Forwarding to Dr. Artis Flock to advise.

## 2019-04-04 ENCOUNTER — Encounter: Admitting: Family Medicine

## 2019-06-15 DIAGNOSIS — Z23 Encounter for immunization: Secondary | ICD-10-CM | POA: Diagnosis not present

## 2020-05-20 ENCOUNTER — Emergency Department (HOSPITAL_COMMUNITY): Payer: Medicare Other

## 2020-05-20 ENCOUNTER — Inpatient Hospital Stay (HOSPITAL_COMMUNITY)
Admission: EM | Admit: 2020-05-20 | Discharge: 2020-05-22 | DRG: 310 | Disposition: A | Discharge status: Home / Self Care | Payer: Medicare Other | Attending: Internal Medicine | Admitting: Internal Medicine

## 2020-05-20 ENCOUNTER — Encounter: Payer: Self-pay | Admitting: Physician Assistant

## 2020-05-20 ENCOUNTER — Ambulatory Visit (INDEPENDENT_AMBULATORY_CARE_PROVIDER_SITE_OTHER): Payer: Medicare Other | Admitting: Physician Assistant

## 2020-05-20 ENCOUNTER — Encounter (HOSPITAL_COMMUNITY): Payer: Self-pay | Admitting: Emergency Medicine

## 2020-05-20 ENCOUNTER — Other Ambulatory Visit: Payer: Self-pay

## 2020-05-20 VITALS — BP 130/82 | HR 102 | Temp 98.3°F | Ht 65.0 in | Wt 212.2 lb

## 2020-05-20 DIAGNOSIS — K219 Gastro-esophageal reflux disease without esophagitis: Secondary | ICD-10-CM | POA: Diagnosis not present

## 2020-05-20 DIAGNOSIS — R519 Headache, unspecified: Secondary | ICD-10-CM

## 2020-05-20 DIAGNOSIS — Z90710 Acquired absence of both cervix and uterus: Secondary | ICD-10-CM | POA: Diagnosis not present

## 2020-05-20 DIAGNOSIS — D751 Secondary polycythemia: Secondary | ICD-10-CM | POA: Diagnosis not present

## 2020-05-20 DIAGNOSIS — I499 Cardiac arrhythmia, unspecified: Secondary | ICD-10-CM | POA: Diagnosis not present

## 2020-05-20 DIAGNOSIS — Z79899 Other long term (current) drug therapy: Secondary | ICD-10-CM

## 2020-05-20 DIAGNOSIS — I4819 Other persistent atrial fibrillation: Secondary | ICD-10-CM | POA: Diagnosis not present

## 2020-05-20 DIAGNOSIS — R270 Ataxia, unspecified: Secondary | ICD-10-CM

## 2020-05-20 DIAGNOSIS — R000 Tachycardia, unspecified: Secondary | ICD-10-CM | POA: Diagnosis not present

## 2020-05-20 DIAGNOSIS — I4891 Unspecified atrial fibrillation: Secondary | ICD-10-CM | POA: Diagnosis not present

## 2020-05-20 DIAGNOSIS — Z20822 Contact with and (suspected) exposure to covid-19: Secondary | ICD-10-CM | POA: Diagnosis present

## 2020-05-20 DIAGNOSIS — R2689 Other abnormalities of gait and mobility: Secondary | ICD-10-CM

## 2020-05-20 DIAGNOSIS — G4489 Other headache syndrome: Secondary | ICD-10-CM | POA: Diagnosis not present

## 2020-05-20 DIAGNOSIS — Z743 Need for continuous supervision: Secondary | ICD-10-CM | POA: Diagnosis not present

## 2020-05-20 HISTORY — DX: Migraine, unspecified, not intractable, without status migrainosus: G43.909

## 2020-05-20 LAB — COMPREHENSIVE METABOLIC PANEL
ALT: 15 U/L (ref 0–44)
AST: 22 U/L (ref 15–41)
Albumin: 4 g/dL (ref 3.5–5.0)
Alkaline Phosphatase: 70 U/L (ref 38–126)
Anion gap: 10 (ref 5–15)
BUN: 17 mg/dL (ref 8–23)
CO2: 26 mmol/L (ref 22–32)
Calcium: 9.8 mg/dL (ref 8.9–10.3)
Chloride: 107 mmol/L (ref 98–111)
Creatinine, Ser: 0.91 mg/dL (ref 0.44–1.00)
GFR calc Af Amer: >60 mL/min (ref >60)
GFR calc non Af Amer: >60 mL/min (ref >60)
Glucose, Bld: 93 mg/dL (ref 70–99)
Potassium: 5 mmol/L (ref 3.5–5.1)
Sodium: 143 mmol/L (ref 135–145)
Total Bilirubin: 0.6 mg/dL (ref 0.3–1.2)
Total Protein: 7.4 g/dL (ref 6.5–8.1)

## 2020-05-20 LAB — CBC WITH DIFFERENTIAL/PLATELET
Abs Immature Granulocytes: 0.02 10*3/uL (ref 0.00–0.07)
Basophils Absolute: 0.1 10*3/uL (ref 0.0–0.1)
Basophils Relative: 1 %
Eosinophils Absolute: 0 10*3/uL (ref 0.0–0.5)
Eosinophils Relative: 0 %
HCT: 47.9 % — ABNORMAL HIGH (ref 36.0–46.0)
Hemoglobin: 15.2 g/dL — ABNORMAL HIGH (ref 12.0–15.0)
Immature Granulocytes: 0 %
Lymphocytes Relative: 35 %
Lymphs Abs: 3.2 10*3/uL (ref 0.7–4.0)
MCH: 29 pg (ref 26.0–34.0)
MCHC: 31.7 g/dL (ref 30.0–36.0)
MCV: 91.2 fL (ref 80.0–100.0)
Monocytes Absolute: 0.5 10*3/uL (ref 0.1–1.0)
Monocytes Relative: 5 %
Neutro Abs: 5.3 10*3/uL (ref 1.7–7.7)
Neutrophils Relative %: 59 %
Platelets: 296 10*3/uL (ref 150–400)
RBC: 5.25 MIL/uL — ABNORMAL HIGH (ref 3.87–5.11)
RDW: 12.9 % (ref 11.5–15.5)
WBC: 9.1 10*3/uL (ref 4.0–10.5)
nRBC: 0 % (ref 0.0–0.2)

## 2020-05-20 MED ORDER — DILTIAZEM HCL 25 MG/5ML IV SOLN
10.0000 mg | Freq: Once | INTRAVENOUS | Status: AC
  Administered 2020-05-20: 10 mg via INTRAVENOUS
  Filled 2020-05-20: qty 5

## 2020-05-20 MED ORDER — PROCHLORPERAZINE EDISYLATE 10 MG/2ML IJ SOLN
10.0000 mg | Freq: Once | INTRAMUSCULAR | Status: AC
  Administered 2020-05-20: 10 mg via INTRAVENOUS
  Filled 2020-05-20: qty 2

## 2020-05-20 MED ORDER — IOHEXOL 350 MG/ML SOLN
75.0000 mL | Freq: Once | INTRAVENOUS | Status: AC | PRN
  Administered 2020-05-20: 75 mL via INTRAVENOUS

## 2020-05-20 MED ORDER — DIPHENHYDRAMINE HCL 50 MG/ML IJ SOLN
25.0000 mg | Freq: Once | INTRAMUSCULAR | Status: AC
  Administered 2020-05-20: 25 mg via INTRAVENOUS
  Filled 2020-05-20: qty 1

## 2020-05-20 NOTE — ED Triage Notes (Signed)
BIB [facility] after pt was seen to be in new onset afib while at [facility] Dr. Isidore Moos this PM. Pt also reports HA since Sat. As well as dizziness. PT denies C/P, NV. Pt hx of hyperlipidemia and migraines.   Vitals HR 130 B/P 132/96 97% RA Temp 97.7

## 2020-05-20 NOTE — Progress Notes (Signed)
Lisa Moore is a 69 y.o. female here for a new problem.  I acted as a Neurosurgeon for [employer], PA-C Corky Mull, LPN   History of Present Illness:   Chief Complaint  Patient presents with  . Headache    HPI   Headache Pt c/o sharp pain behind left ear and radiates to her head, started on Saturday evening. Pt said she had a migraine on Saturday, first one in about 6 yrs. Pt said it was above R eye and had flashing lights (auras) -- has had in the past. She had dental work about 3 weeks ago on her upper L jaw and called her dentist today to see if her symptoms were related to this and he said to follow-up in our office.  Since her headache started Saturday, she has had balance concerns (denies falls), but feels like her gait wants to veer to the right. When driving here she felt dizzy. She lives alone and drove herself here.  Denies: chest pain, palpitations, SOB, blurred vision, slurred speech, unilateral weakness     Past Medical History:  Diagnosis Date  . Arthritis   . Benign hematuria   . GERD (gastroesophageal reflux disease)      Social History   Tobacco Use  . Smoking status: Never Smoker  . Smokeless tobacco: Never Used  Vaping Use  . Vaping Use: Never used  Substance Use Topics  . Alcohol use: No  . Drug use: No    Past Surgical History:  Procedure Laterality Date  . ABDOMINAL HYSTERECTOMY    . APPENDECTOMY      Family History  Problem Relation Age of Onset  . Hypertension Mother   . Cancer Father   . COPD Father   . Diabetes Father   . Cancer Sister   . Kidney disease Maternal Grandmother   . Heart attack Maternal Grandfather   . Cancer Paternal Grandmother   . Heart attack Paternal Grandfather     No Known Allergies  Current Medications:   Current Outpatient Medications:  .  naproxen sodium (ALEVE) 220 MG tablet, Take 220 mg by mouth daily as needed., Disp: , Rfl:    Review of Systems:   ROS  Negative unless otherwise  specified per HPI.  Vitals:   Vitals:   05/20/20 1429  BP: 130/82  Pulse: (!) 102  Temp: 98.3 F (36.8 C)  TempSrc: Temporal  SpO2: 96%  Weight: 212 lb 4 oz (96.3 kg)  Height: 5\' 5"  (1.651 m)     Body mass index is 35.32 kg/m.  Physical Exam:   Physical Exam Vitals and nursing note reviewed.  Constitutional:      General: She is not in acute distress.    Appearance: She is well-developed. She is not ill-appearing or toxic-appearing.  Cardiovascular:     Rate and Rhythm: Normal rate. Rhythm irregularly irregular.     Pulses: Normal pulses.     Heart sounds: Normal heart sounds, S1 normal and S2 normal.     Comments: No LE edema Pulmonary:     Effort: Pulmonary effort is normal.     Breath sounds: Normal breath sounds.  Skin:    General: Skin is warm and dry.  Neurological:     General: No focal deficit present.     Mental Status: She is alert.     GCS: GCS eye subscore is 4. GCS verbal subscore is 5. GCS motor subscore is 6.     Sensory: Sensation is intact.  Motor: Motor function is intact.  Psychiatric:        Speech: Speech normal.        Behavior: Behavior normal. Behavior is cooperative.      Assessment and Plan:   Lisa Moore was seen today for headache.  Diagnoses and all orders for this visit:  Irregular heart rate; Acute nonintractable headache, unspecified headache type -     EKG 12-Lead  EKG tracing is personally reviewed.  New onset atrial fibrillation. Has had symptoms x 4 days. Given headache, neurological symptoms and new onset atrial fibrillation will send to ER via EMS. Patient is agreeable to plan.  CMA or LPN served as scribe during this visit. History, Physical, and Plan performed by medical provider. The above documentation has been reviewed and is accurate and complete.  Patient has a HIGH level of medical complexity due to number of diagnoses/treatment options and amount/complexity of data reviewed.   Jarold Motto, PA-C

## 2020-05-20 NOTE — ED Provider Notes (Signed)
[facility] EMERGENCY DEPARTMENT Provider Note   CSN: [id number] Arrival date & time: 05/20/20  1618     History Chief Complaint  Patient presents with  . Atrial Fibrillation    Lisa Moore is a 69 y.o. female.  69yo F w/ PMH including migraines, GERD, arthritis who p/w headache and irregular heart beat. 3 days ago, she began having flashes of light in R eye that she has had w/ aura before migraines in the past. She then began having typical migraine on R side of head. It eventually subsided. That afternoon, she began having sharp pain in her left head in 1 spot (behind ear to Left parietal scalp) which felt pulsatile and was different from her usual migraine. Mild dizziness and she felt like she was leaning to the right. No vision changes, N/V, focal weakness/numbness, speech problems, ear pain, hearing problems, or confusion. This headache has persisted since then which prompted her to see her PCP today. She initially thought it might be related to recent dental work she had but she denies specific dental problems currently. At PCP office, she was noted to be in A fib which is what prompted transfer to ED. She denies h/o A fib. She does note having a weird dream a few nights ago and woke up feeling her heart racing. She denies any heart racing/palpitations currently. No CP, SOB, lightheadedness. No alcohol or drug problems, no new medications.   The history is provided by the patient and a relative.  Atrial Fibrillation       Past Medical History:  Diagnosis Date  . Arthritis   . Benign hematuria   . GERD (gastroesophageal reflux disease)     Patient Active Problem List   Diagnosis Date Noted  . Hyperlipidemia 04/03/2018  . GERD (gastroesophageal reflux disease) 03/31/2018    Past Surgical History:  Procedure Laterality Date  . ABDOMINAL HYSTERECTOMY    . APPENDECTOMY       OB History   No obstetric history on file.     Family History  Problem  Relation Age of Onset  . Hypertension Mother   . Cancer Father   . COPD Father   . Diabetes Father   . Cancer Sister   . Kidney disease Maternal Grandmother   . Heart attack Maternal Grandfather   . Cancer Paternal Grandmother   . Heart attack Paternal Grandfather     Social History   Tobacco Use  . Smoking status: Never Smoker  . Smokeless tobacco: Never Used  Vaping Use  . Vaping Use: Never used  Substance Use Topics  . Alcohol use: No  . Drug use: No    Home Medications Prior to Admission medications   Medication Sig Start Date End Date Taking? Authorizing Provider  naproxen sodium (ALEVE) 220 MG tablet Take 220 mg by mouth daily as needed.    [provider]    Allergies    Patient has no known allergies.  Review of Systems   Review of Systems All other systems reviewed and are negative except that which was mentioned in HPI  Physical Exam Updated Vital Signs BP (!) 125/95   Pulse 94   Temp 97.7 F (36.5 C) (Oral)   Resp 15   Ht 5\' 5"  (1.651 m)   Wt 96.2 kg   LMP  (LMP Unknown)   SpO2 96%   BMI 35.28 kg/m   Physical Exam Vitals and nursing note reviewed.  Constitutional:  General: She is not in acute distress.    Appearance: She is well-developed.     Comments: Awake, alert  HENT:     Head: Normocephalic and atraumatic.  Eyes:     Extraocular Movements: Extraocular movements intact.     Conjunctiva/sclera: Conjunctivae normal.     Pupils: Pupils are equal, round, and reactive to light.  Cardiovascular:     Rate and Rhythm: Tachycardia present. Rhythm irregularly irregular.     Heart sounds: Normal heart sounds. No murmur heard.   Pulmonary:     Effort: Pulmonary effort is normal. No respiratory distress.     Breath sounds: Normal breath sounds.  Abdominal:     General: Bowel sounds are normal. There is no distension.     Palpations: Abdomen is soft.     Tenderness: There is no abdominal tenderness.  Musculoskeletal:      Cervical back: Neck supple.  Skin:    General: Skin is warm and dry.  Neurological:     Mental Status: She is alert and oriented to person, place, and time.     Cranial Nerves: No cranial nerve deficit.     Motor: No abnormal muscle tone.     Deep Tendon Reflexes: Reflexes are normal and symmetric.     Comments: Fluent speech, normal finger-to-nose testing, negative pronator drift, no clonus 5/5 strength and normal sensation x all 4 extremities  Psychiatric:        Thought Content: Thought content normal.        Judgment: Judgment normal.     ED Results / Procedures / Treatments   Labs (all labs ordered are listed, but only abnormal results are displayed) Labs Reviewed  CBC WITH DIFFERENTIAL/PLATELET - Abnormal; Notable for the following components:      Result Value   RBC 5.25 (*)    Hemoglobin 15.2 (*)    HCT 47.9 (*)    All other components within normal limits  COMPREHENSIVE METABOLIC PANEL    EKG EKG Interpretation  Date/Time:  Tuesday May 20 2020 16:30:31 EDT Ventricular Rate:  115 PR Interval:    QRS Duration: 84 QT Interval:  339 QTC Calculation: 469 R Axis:   49 Text Interpretation: Atrial fibrillation Low voltage, precordial leads A fib new from previous Confirmed by Frederick Peers [phone]) on 05/20/2020 4:42:34 PM   Radiology DG Chest Port 1 View  Result Date: 05/20/2020 CLINICAL DATA:  AFib with RVR EXAM: PORTABLE CHEST 1 VIEW COMPARISON:  09/03/2016 FINDINGS: The heart size and mediastinal contours are within normal limits. Both lungs are clear. The visualized skeletal structures are unremarkable. IMPRESSION: No active disease. Electronically Signed   By: Jasmine Pang M.D.   On: 05/20/2020 18:55    Procedures Procedures (including critical care time)  Medications Ordered in ED Medications  diltiazem (CARDIZEM) injection 10 mg (10 mg Intravenous Given 05/20/20 1801)  diphenhydrAMINE (BENADRYL) injection 25 mg (25 mg Intravenous Given 05/20/20 2122)    prochlorperazine (COMPAZINE) injection 10 mg (10 mg Intravenous Given 05/20/20 2122)    ED Course  I have reviewed the triage vital signs and the nursing notes.  Pertinent labs & imaging results that were available during my care of the patient were reviewed by me and considered in my medical decision making (see chart for details).  This patients CHA2DS2-VASc Score and unadjusted Ischemic Stroke Rate (% per year) is equal to 2.2 % stroke rate/year from a score of 2  Above score calculated as 1 point each if present [CHF,  HTN, DM, Vascular=MI/PAD/Aortic Plaque, Age if 74-74, or Female] Above score calculated as 2 points each if present [Age > 75, or Stroke/TIA/TE]    MDM Rules/Calculators/A&P                          Awake and alert, neurologically intact on exam.  No focal neurologic deficits.  Regarding her headache, she states that this headache is different from her usual migraines and I have recommended CTA of head and neck to evaluate for aneurysm or SAH.  Gave the patient a migraine cocktail with some improvement.  Regarding the patient's new onset of atrial fibrillation, her chads vas score is 2 and I have discussed recommendation for anticoagulation for stroke risk reduction.  Patient responded well to single dose of IV diltiazem and is currently rate controlled.  Discussed plan with cardiologist on-call Dr. Okey Dupre who agreed with plan to start Eliquis and diltiazem with follow-up in the atrial fibrillation clinic.  Patient signed out pending head imaging and reassessment. Final Clinical Impression(s) / ED Diagnoses Final diagnoses:  Atrial fibrillation, unspecified type [facility])    Rx / DC Orders ED Discharge Orders    None       Tijana Walder, Ambrose Finland, MD 05/20/20 2346

## 2020-05-20 NOTE — Patient Instructions (Signed)
It was great to see you!  It is my recommendation that you go to the ER for further evaluation    Atrial Fibrillation  Atrial fibrillation is a type of irregular or rapid heartbeat (arrhythmia). In atrial fibrillation, the top part of the heart (atria) beats in an irregular pattern. This makes the heart unable to pump blood normally and effectively. The goal of treatment is to prevent blood clots from forming, control your heart rate, or restore your heartbeat to a normal rhythm. If this condition is not treated, it can cause serious problems, such as a weakened heart muscle (cardiomyopathy) or a stroke. What are the causes? This condition is often caused by medical conditions that damage the heart's electrical system. These include:  High blood pressure (hypertension). This is the most common cause.  Certain heart problems or conditions, such as heart failure, coronary artery disease, heart valve problems, or heart surgery.  Diabetes.  Overactive thyroid (hyperthyroidism).  Obesity.  Chronic kidney disease. In some cases, the cause of this condition is not known. What increases the risk? This condition is more likely to develop in:  Older people.  People who smoke.  Athletes who do endurance exercise.  People who have a family history of atrial fibrillation.  Men.  People who use drugs.  People who drink a lot of alcohol.  People who have lung conditions, such as emphysema, pneumonia, or COPD.  People who have obstructive sleep apnea. What are the signs or symptoms? Symptoms of this condition include:  A feeling that your heart is racing or beating irregularly.  Discomfort or pain in your chest.  Shortness of breath.  Sudden light-headedness or weakness.  Tiring easily during exercise or activity.  Fatigue.  Syncope (fainting).  Sweating. In some cases, there are no symptoms. How is this diagnosed? Your health care provider may detect atrial  fibrillation when taking your pulse. If detected, this condition may be diagnosed with:  An electrocardiogram (ECG) to check electrical signals of the heart.  An ambulatory cardiac monitor to record your heart's activity for a few days.  A transthoracic echocardiogram (TTE) to create pictures of your heart.  A transesophageal echocardiogram (TEE) to create even closer pictures of your heart.  A stress test to check your blood supply while you exercise.  Imaging tests, such as a CT scan or chest X-ray.  Blood tests. How is this treated? Treatment depends on underlying conditions and how you feel when you experience atrial fibrillation. This condition may be treated with:  Medicines to prevent blood clots or to treat heart rate or heart rhythm problems.  Electrical cardioversion to reset the heart's rhythm.  A pacemaker to correct abnormal heart rhythm.  Ablation to remove the heart tissue that sends abnormal signals.  Left atrial appendage closure to seal the area where blood clots can form. In some cases, underlying conditions will be treated. Follow these instructions at home: Medicines  Take over-the counter and prescription medicines only as told by your health care provider.  Do not take any new medicines without talking to your health care provider.  If you are taking blood thinners: ? Talk with your health care provider before you take any medicines that contain aspirin or NSAIDs, such as ibuprofen. These medicines increase your risk for dangerous bleeding. ? Take your medicine exactly as told, at the same time every day. ? Avoid activities that could cause injury or bruising, and follow instructions about how to prevent falls. ? Wear a medical  alert bracelet or carry a card that lists what medicines you take. Lifestyle      Do not use any products that contain nicotine or tobacco, such as cigarettes, e-cigarettes, and chewing tobacco. If you need help quitting, ask  your health care provider.  Eat heart-healthy foods. Talk with a dietitian to make an eating plan that is right for you.  Exercise regularly as told by your health care provider.  Do not drink alcohol.  Lose weight if you are overweight.  Do not use drugs, including cannabis. General instructions  If you have obstructive sleep apnea, manage your condition as told by your health care provider.  Do not use diet pills unless your health care provider approves. Diet pills can make heart problems worse.  Keep all follow-up visits as told by your health care provider. This is important. Contact a health care provider if you:  Notice a change in the rate, rhythm, or strength of your heartbeat.  Are taking a blood thinner and you notice more bruising.  Tire more easily when you exercise or do heavy work.  Have a sudden change in weight. Get help right away if you have:   Chest pain, abdominal pain, sweating, or weakness.  Trouble breathing.  Side effects of blood thinners, such as blood in your vomit, stool, or urine, or bleeding that cannot stop.  Any symptoms of a stroke. "BE FAST" is an easy way to remember the main warning signs of a stroke: ? B - Balance. Signs are dizziness, sudden trouble walking, or loss of balance. ? E - Eyes. Signs are trouble seeing or a sudden change in vision. ? F - Face. Signs are sudden weakness or numbness of the face, or the face or eyelid drooping on one side. ? A - Arms. Signs are weakness or numbness in an arm. This happens suddenly and usually on one side of the body. ? S - Speech. Signs are sudden trouble speaking, slurred speech, or trouble understanding what people say. ? T - Time. Time to call emergency services. Write down what time symptoms started.  Other signs of a stroke, such as: ? A sudden, severe headache with no known cause. ? Nausea or vomiting. ? Seizure. These symptoms may represent a serious problem that is an emergency.  Do not wait to see if the symptoms will go away. Get medical help right away. Call your local emergency services (911 in the [place] Do not drive yourself to the hospital. Summary  Atrial fibrillation is a type of irregular or rapid heartbeat (arrhythmia).  Symptoms include a feeling that your heart is beating fast or irregularly.  You may be given medicines to prevent blood clots or to treat heart rate or heart rhythm problems.  Get help right away if you have signs or symptoms of a stroke.  Get help right away if you cannot catch your breath or have chest pain or pressure. This information is not intended to replace advice given to you by your health care provider. Make sure you discuss any questions you have with your health care provider. Document Revised: 02/21/2019 Document Reviewed: 02/21/2019 Elsevier Patient Education  2020 [employer].

## 2020-05-21 ENCOUNTER — Observation Stay (HOSPITAL_COMMUNITY): Payer: Medicare Other

## 2020-05-21 ENCOUNTER — Encounter (HOSPITAL_COMMUNITY): Payer: Self-pay | Admitting: Family Medicine

## 2020-05-21 DIAGNOSIS — J3489 Other specified disorders of nose and nasal sinuses: Secondary | ICD-10-CM | POA: Diagnosis not present

## 2020-05-21 DIAGNOSIS — I361 Nonrheumatic tricuspid (valve) insufficiency: Secondary | ICD-10-CM

## 2020-05-21 DIAGNOSIS — R42 Dizziness and giddiness: Secondary | ICD-10-CM | POA: Diagnosis not present

## 2020-05-21 DIAGNOSIS — Z90710 Acquired absence of both cervix and uterus: Secondary | ICD-10-CM | POA: Diagnosis not present

## 2020-05-21 DIAGNOSIS — G319 Degenerative disease of nervous system, unspecified: Secondary | ICD-10-CM | POA: Diagnosis not present

## 2020-05-21 DIAGNOSIS — R270 Ataxia, unspecified: Secondary | ICD-10-CM | POA: Diagnosis not present

## 2020-05-21 DIAGNOSIS — Z20822 Contact with and (suspected) exposure to covid-19: Secondary | ICD-10-CM | POA: Diagnosis present

## 2020-05-21 DIAGNOSIS — I4819 Other persistent atrial fibrillation: Secondary | ICD-10-CM

## 2020-05-21 DIAGNOSIS — I4891 Unspecified atrial fibrillation: Secondary | ICD-10-CM | POA: Diagnosis not present

## 2020-05-21 DIAGNOSIS — D751 Secondary polycythemia: Secondary | ICD-10-CM | POA: Diagnosis present

## 2020-05-21 DIAGNOSIS — R2689 Other abnormalities of gait and mobility: Secondary | ICD-10-CM

## 2020-05-21 DIAGNOSIS — I340 Nonrheumatic mitral (valve) insufficiency: Secondary | ICD-10-CM

## 2020-05-21 DIAGNOSIS — K219 Gastro-esophageal reflux disease without esophagitis: Secondary | ICD-10-CM | POA: Diagnosis present

## 2020-05-21 DIAGNOSIS — Z79899 Other long term (current) drug therapy: Secondary | ICD-10-CM | POA: Diagnosis not present

## 2020-05-21 HISTORY — DX: Other persistent atrial fibrillation: I48.19

## 2020-05-21 LAB — BASIC METABOLIC PANEL
Anion gap: 8 (ref 5–15)
BUN: 14 mg/dL (ref 8–23)
CO2: 24 mmol/L (ref 22–32)
Calcium: 9.3 mg/dL (ref 8.9–10.3)
Chloride: 105 mmol/L (ref 98–111)
Creatinine, Ser: 0.77 mg/dL (ref 0.44–1.00)
GFR calc Af Amer: >60 mL/min (ref >60)
GFR calc non Af Amer: >60 mL/min (ref >60)
Glucose, Bld: 109 mg/dL — ABNORMAL HIGH (ref 70–99)
Potassium: 3.7 mmol/L (ref 3.5–5.1)
Sodium: 137 mmol/L (ref 135–145)

## 2020-05-21 LAB — CBC
HCT: 44.7 % (ref 36.0–46.0)
Hemoglobin: 14.5 g/dL (ref 12.0–15.0)
MCH: 29.2 pg (ref 26.0–34.0)
MCHC: 32.4 g/dL (ref 30.0–36.0)
MCV: 90.1 fL (ref 80.0–100.0)
Platelets: 280 10*3/uL (ref 150–400)
RBC: 4.96 MIL/uL (ref 3.87–5.11)
RDW: 13.1 % (ref 11.5–15.5)
WBC: 8.6 10*3/uL (ref 4.0–10.5)
nRBC: 0 % (ref 0.0–0.2)

## 2020-05-21 LAB — HIV ANTIBODY (ROUTINE TESTING W REFLEX): HIV Screen 4th Generation wRfx: NONREACTIVE

## 2020-05-21 LAB — ECHOCARDIOGRAM COMPLETE
Area-P 1/2: 4.18 cm2
Height: 65 in
S' Lateral: 2.8 cm
Weight: 3360 oz

## 2020-05-21 LAB — MAGNESIUM: Magnesium: 1.8 mg/dL (ref 1.7–2.4)

## 2020-05-21 LAB — SARS CORONAVIRUS 2 BY RT PCR (HOSPITAL ORDER, PERFORMED IN ~~LOC~~ HOSPITAL LAB): SARS Coronavirus 2: NEGATIVE

## 2020-05-21 LAB — TSH: TSH: 3.286 u[IU]/mL (ref 0.350–4.500)

## 2020-05-21 MED ORDER — SODIUM CHLORIDE 0.9 % IV SOLN
250.0000 mL | INTRAVENOUS | Status: DC | PRN
  Administered 2020-05-21: 200 mL via INTRAVENOUS

## 2020-05-21 MED ORDER — DILTIAZEM HCL ER COATED BEADS 120 MG PO CP24: 120.0000 mg | ORAL_CAPSULE | Freq: Every day | ORAL | 0 refills | Status: DC

## 2020-05-21 MED ORDER — SODIUM CHLORIDE 0.9% FLUSH: 3.0000 mL | INTRAVENOUS | Status: DC | PRN

## 2020-05-21 MED ORDER — ACETAMINOPHEN 325 MG PO TABS: 650.0000 mg | ORAL_TABLET | ORAL | Status: DC | PRN

## 2020-05-21 MED ORDER — DILTIAZEM HCL-DEXTROSE 125-5 MG/125ML-% IV SOLN (PREMIX)
5.0000 mg/h | INTRAVENOUS | Status: DC
  Administered 2020-05-21: 5 mg/h via INTRAVENOUS
  Filled 2020-05-21 (×2): qty 125

## 2020-05-21 MED ORDER — OFF THE BEAT BOOK
Freq: Once | Status: AC
  Filled 2020-05-21: qty 1

## 2020-05-21 MED ORDER — ONDANSETRON HCL 4 MG/2ML IJ SOLN: 4.0000 mg | Freq: Four times a day (QID) | INTRAMUSCULAR | Status: DC | PRN

## 2020-05-21 MED ORDER — SODIUM CHLORIDE 0.9% FLUSH
3.0000 mL | Freq: Two times a day (BID) | INTRAVENOUS | Status: DC
  Administered 2020-05-21 – 2020-05-22 (×2): 3 mL via INTRAVENOUS

## 2020-05-21 MED ORDER — DILTIAZEM HCL 60 MG PO TABS
60.0000 mg | ORAL_TABLET | Freq: Three times a day (TID) | ORAL | Status: DC
  Administered 2020-05-21 – 2020-05-22 (×3): 60 mg via ORAL
  Filled 2020-05-21 (×3): qty 1

## 2020-05-21 MED ORDER — APIXABAN 5 MG PO TABS
5.0000 mg | ORAL_TABLET | Freq: Two times a day (BID) | ORAL | Status: DC
  Administered 2020-05-21 – 2020-05-22 (×4): 5 mg via ORAL
  Filled 2020-05-21 (×4): qty 1

## 2020-05-21 MED ORDER — APIXABAN 5 MG PO TABS: 5.0000 mg | ORAL_TABLET | Freq: Two times a day (BID) | ORAL | 0 refills | Status: DC

## 2020-05-21 MED ORDER — DILTIAZEM LOAD VIA INFUSION
10.0000 mg | Freq: Once | INTRAVENOUS | Status: AC
  Administered 2020-05-21: 10 mg via INTRAVENOUS
  Filled 2020-05-21: qty 10

## 2020-05-21 NOTE — Discharge Instructions (Signed)

## 2020-05-21 NOTE — Consult Note (Addendum)
Cardiology Consultation:   Patient ID: Lisa Moore MRN: [id number]; DOB: 1950-09-21  Admit date: 05/20/2020 Date of Consult: 05/21/2020  Primary Care Provider: Orland Mustard, MD [facility] Cardiologist: Olga Millers, MD (new) [facility] Electrophysiologist:  None    Patient Profile:   Lisa Moore is a very pleasant, active 69 y.o. female with a hx of rare migraines, remote benign hematuria, otherwise healthy who is being seen today for the evaluation of new onset atrial fibrillation at the request of Dr. Jonathon Bellows.  History of Present Illness:   Lisa Moore has no prior cardiac history. In general she considers herself in good health with minimal PMH. She enjoys working in the yard & garden quite a bit. 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  She was in her USOH on Saturday when she developed migraine with aura above her right eye with flashing lights stereotypical to what she'd had in the past, the last one several years ago. She took it easy but continued to have a left sided intermittent sharp headache that persisted which was unusual. She also felt a sensation of mild disequilibrium. It was not severe but felt as though she might veer to the right when walking. Due to this she presented to her PCP's office yesterday for evaluation and was incidentally found to be in Afib RVR. HR was 115-130. She was subsequently sent to the ED. She denies any awareness of the arrhythmia. She does recall about 2 weeks ago awakening from a dream with heart racing but this was short-lived and felt normal soon after. She has otherwise gone about her day the last few days without any CP, SOB, nausea, or syncope. She thinks she snores occasionally but not with regularity. Denies unusual daytime fatigue.  In the ED her case was discussed with fellow overnight who recommended initiation of  Eliquis, diltiazem and outpatient follow-up. (Rx's are listed on home med list because they were sent in at that time.) At that time she had received a dose of IV diltiazem with improvement. However, rate became elevated again so she was admitted by IM. She also received a migraine cocktail with compazine/benadryl with resolution of her headache. CTA head/neck and brain MRI were unrevealing. 2D echo today showed normal LVEF, moderate LA dilation, degenerative MV with mild MR. She currently feels well. She is on diltiazem 5mg /hr with HR in the 90s/low 100s. Labs generally unremarkable, TSH wnl, Covid test negative. She has been started on Eliquis as well.  Past Medical History:  Diagnosis Date  . Arthritis   . Benign hematuria   . GERD (gastroesophageal reflux disease)   . Migraines    rare  . New onset atrial fibrillation (HCC) 05/21/2020    Past Surgical History:  Procedure Laterality Date  . ABDOMINAL HYSTERECTOMY    . APPENDECTOMY       Home Medications:  Prior to Admission medications   Medication Sig Start Date End Date Taking? Authorizing Provider  naproxen sodium (ALEVE) 220 MG tablet Take 220 mg by mouth daily as needed.   Yes [provider]  apixaban (ELIQUIS) 5 MG TABS tablet Take 1 tablet (5 mg total) by mouth 2 (two) times daily. 05/21/20 05/21/20  Palumbo, April, MD  diltiazem (CARDIZEM CD) 120 MG 24 hr capsule Take 1 capsule (120 mg total) by mouth daily. 05/21/20 05/21/20  Palumbo, April, MD    Inpatient  Medications: Scheduled Meds: . apixaban  5 mg Oral BID  . sodium chloride flush  3 mL Intravenous Q12H   Continuous Infusions: . sodium chloride 200 mL (05/21/20 1031)  . diltiazem (CARDIZEM) infusion 5 mg/hr (05/21/20 0205)   PRN Meds: sodium chloride, acetaminophen, ondansetron (ZOFRAN) IV, sodium chloride flush  Allergies:   No Known Allergies  Social History:   Social History   Socioeconomic History  . Marital status: Widowed    Spouse name: Not on  file  . Number of children: Not on file  . Years of education: Not on file  . Highest education level: Not on file  Occupational History  . Not on file  Tobacco Use  . Smoking status: Never Smoker  . Smokeless tobacco: Never Used  Vaping Use  . Vaping Use: Never used  Substance and Sexual Activity  . Alcohol use: Yes    Comment: occasional beer few times a month, not regularly  . Drug use: No  . Sexual activity: Not Currently  Other Topics Concern  . Not on file  Social History Narrative  . Not on file   Social Determinants of Health   Financial Resource Strain:   . Difficulty of Paying Living Expenses: Not on file  Food Insecurity:   . Worried About Programme researcher, broadcasting/film/video in the Last Year: Not on file  . Ran Out of Food in the Last Year: Not on file  Transportation Needs:   . Lack of Transportation (Medical): Not on file  . Lack of Transportation (Non-Medical): Not on file  Physical Activity:   . Days of Exercise per Week: Not on file  . Minutes of Exercise per Session: Not on file  Stress:   . Feeling of Stress : Not on file  Social Connections:   . Frequency of Communication with Friends and Family: Not on file  . Frequency of Social Gatherings with Friends and Family: Not on file  . Attends Religious Services: Not on file  . Active Member of Clubs or Organizations: Not on file  . Attends Banker Meetings: Not on file  . Marital Status: Not on file  Intimate Partner Violence:   . Fear of Current or Ex-Partner: Not on file  . Emotionally Abused: Not on file  . Physically Abused: Not on file  . Sexually Abused: Not on file    Family History:    Family History  Problem Relation Age of Onset  . Hypertension Mother   . Cancer Father   . COPD Father   . Diabetes Father   . Cancer Sister   . Kidney disease Maternal Grandmother   . Cancer Paternal Grandmother      ROS:  Please see the history of present illness.   All other ROS reviewed and  negative.     Physical Exam/Data:   Vitals:   05/21/20 0530 05/21/20 0630 05/21/20 0730 05/21/20 0823  BP:    (!) 115/93  Pulse: 79 78 78 90  Resp: Temp:    97.9 F (36.6 C)  TempSrc:    Oral  SpO2: 96% 95% 95% 95%  Weight:    95.3 kg  Height:     (1.651 m)   No intake or output data in the 24 hours ending 05/21/20 1415 Last 3 Weights 05/21/2020 05/20/2020 05/20/2020  Weight (lbs) 210 lb 212 lb 212 lb 4 oz  Weight (kg) 95.255 kg 96.163 kg 96.276 kg  Body mass index is 34.95 kg/m.  General: Well developed, well nourished WF, in no acute distress. Head: Normocephalic, atraumatic, sclera non-icteric, no xanthomas, nares are without discharge. Neck: Negative for carotid bruits. JVP not elevated. Lungs: Clear bilaterally to auscultation without wheezes, rales, or rhonchi. Breathing is unlabored. Heart: Irregularly irregular rhythm, S1 S2 without murmurs, rubs, or gallops.  Abdomen: Soft, non-tender, non-distended with normoactive bowel sounds. No rebound/guarding. Extremities: No clubbing or cyanosis. No edema. Distal pedal pulses are 2+ and equal bilaterally. Neuro: Alert and oriented X 3. Moves all extremities spontaneously. Psych:  Responds to questions appropriately with a normal affect.   EKG:  The EKG was personally reviewed and demonstrates:  Atrial fibrillation 115bpm, generally low voltage, no acute change from prior  Telemetry:  Telemetry was personally reviewed and demonstrates:  Persistent atrial fibrillation  Relevant CV Studies: 2D Echo 05/21/20 1. Left ventricular ejection fraction, by estimation, is 55 to 60%. The  left ventricle has normal function. The left ventricle has no regional  wall motion abnormalities. Left ventricular diastolic parameters are  indeterminate.  2. Right ventricular systolic function is normal. The right ventricular  size is normal.  3. Left atrial size was moderately dilated.  4. The mitral valve is degenerative.  Mild mitral valve regurgitation. No  evidence of mitral stenosis. Moderate mitral annular calcification.  5. The aortic valve is tricuspid. Aortic valve regurgitation is not  visualized. No aortic stenosis is present.  6. The inferior vena cava is normal in size with greater than 50%  respiratory variability, suggesting right atrial pressure of 3 mmHg.   Laboratory Data:  High Sensitivity Troponin:  No results for input(s): TROPONINIHS in the last 720 hours.   Chemistry Recent Labs  Lab 05/20/20 1909 05/21/20 0433  NA 143 137  K 5.0 3.7  CL 107 105  CO2 26 24  GLUCOSE 93 109*  BUN 17 14  CREATININE 0.91 0.77  CALCIUM 9.8 9.3  GFRNONAA >60 >60  GFRAA >60 >60  ANIONGAP 10 8    Recent Labs  Lab 05/20/20 1909  PROT 7.4  ALBUMIN 4.0  AST 22  ALT 15  ALKPHOS 70  BILITOT 0.6   Hematology Recent Labs  Lab 05/20/20 1909 05/21/20 0433  WBC 9.1 8.6  RBC 5.25* 4.96  HGB 15.2* 14.5  HCT 47.9* 44.7  MCV 91.2 90.1  MCH 29.0 29.2  MCHC 31.7 32.4  RDW 12.9 13.1  PLT 296 280   BNPNo results for input(s): BNP, PROBNP in the last 168 hours.  DDimer No results for input(s): DDIMER in the last 168 hours.   Radiology/Studies:  CT Angio Head W or Wo Contrast  Result Date: 05/21/2020 CLINICAL DATA:  Initial evaluation for acute headache. EXAM: CT ANGIOGRAPHY HEAD AND NECK TECHNIQUE: Multidetector CT imaging of the head and neck was performed using the standard protocol during bolus administration of intravenous contrast. Multiplanar CT image reconstructions and MIPs were obtained to evaluate the vascular anatomy. Carotid stenosis measurements (when applicable) are obtained utilizing NASCET criteria, using the distal internal carotid diameter as the denominator. CONTRAST:  23mL OMNIPAQUE IOHEXOL 350 MG/ML SOLN COMPARISON:  None. FINDINGS: CT HEAD FINDINGS Brain: Cerebral volume within normal limits for patient age. No evidence for acute intracranial hemorrhage. No findings to  suggest acute large vessel territory infarct. No mass lesion, midline shift, or mass effect. Ventricles are normal in size without evidence for hydrocephalus. No extra-axial fluid collection identified. Vascular: No hyperdense vessel identified. Skull: Scalp soft tissues demonstrate no  acute abnormality. Calvarium intact. Sinuses/Orbits: Globes and orbital soft tissues within normal limits. Visualized paranasal sinuses are clear. No mastoid effusion. CTA NECK FINDINGS Aortic arch: Visualized aortic arch of normal caliber with normal branch pattern. No flow-limiting stenosis or other abnormality about the origin of the great vessels. Right carotid system: Right common and internal carotid arteries widely patent without stenosis, dissection or occlusion. Left carotid system: Left common and internal carotid arteries widely patent without stenosis, dissection or occlusion. Vertebral arteries: Both vertebral arteries arise from the subclavian arteries. No proximal subclavian artery stenosis. Vertebral arteries widely patent without stenosis, dissection or occlusion. Skeleton: No acute osseous abnormality. No discrete or worrisome osseous lesions. Other neck: No other acute soft tissue abnormality within the neck. No mass lesion or adenopathy. Upper chest: Visualized upper chest demonstrates no other acute abnormality. Review of the MIP images confirms the above findings CTA HEAD FINDINGS Anterior circulation: Both internal carotid arteries widely patent to the termini without stenosis or other abnormality. A1 segments widely patent. Normal anterior communicating artery complex. Anterior cerebral arteries widely patent to their distal aspects without stenosis. Normal in stenosis or occlusion. Normal MCA bifurcations. Distal MCA branches well perfused and symmetric. Posterior circulation: Both vertebral arteries widely patent to the vertebrobasilar junction without stenosis. Both picas patent. Short fenestration involving  the proximal basilar artery noted. Basilar widely patent to its distal aspect without stenosis. Superior cerebral arteries patent bilaterally. Both PCAs primarily supplied via the basilar well perfused or distal aspects. Venous sinuses: Not well assessed due to timing the contrast bolus. Anatomic variants: None significant. No intracranial aneurysm or other vascular abnormality. Review of the MIP images confirms the above findings IMPRESSION: 1. Normal CTA of the head and neck. No large vessel occlusion, hemodynamically significant stenosis, or other acute vascular abnormality. No aneurysm. 2. No other acute intracranial abnormality. Electronically Signed   By: Rise Mu M.D.   On: 05/21/2020 00:49   CT Angio Neck W and/or Wo Contrast  Result Date: 05/21/2020 CLINICAL DATA:  Initial evaluation for acute headache. EXAM: CT ANGIOGRAPHY HEAD AND NECK TECHNIQUE: Multidetector CT imaging of the head and neck was performed using the standard protocol during bolus administration of intravenous contrast. Multiplanar CT image reconstructions and MIPs were obtained to evaluate the vascular anatomy. Carotid stenosis measurements (when applicable) are obtained utilizing NASCET criteria, using the distal internal carotid diameter as the denominator. CONTRAST:  49mL OMNIPAQUE IOHEXOL 350 MG/ML SOLN COMPARISON:  None. FINDINGS: CT HEAD FINDINGS Brain: Cerebral volume within normal limits for patient age. No evidence for acute intracranial hemorrhage. No findings to suggest acute large vessel territory infarct. No mass lesion, midline shift, or mass effect. Ventricles are normal in size without evidence for hydrocephalus. No extra-axial fluid collection identified. Vascular: No hyperdense vessel identified. Skull: Scalp soft tissues demonstrate no acute abnormality. Calvarium intact. Sinuses/Orbits: Globes and orbital soft tissues within normal limits. Visualized paranasal sinuses are clear. No mastoid effusion. CTA  NECK FINDINGS Aortic arch: Visualized aortic arch of normal caliber with normal branch pattern. No flow-limiting stenosis or other abnormality about the origin of the great vessels. Right carotid system: Right common and internal carotid arteries widely patent without stenosis, dissection or occlusion. Left carotid system: Left common and internal carotid arteries widely patent without stenosis, dissection or occlusion. Vertebral arteries: Both vertebral arteries arise from the subclavian arteries. No proximal subclavian artery stenosis. Vertebral arteries widely patent without stenosis, dissection or occlusion. Skeleton: No acute osseous abnormality. No discrete or worrisome osseous lesions. Other  neck: No other acute soft tissue abnormality within the neck. No mass lesion or adenopathy. Upper chest: Visualized upper chest demonstrates no other acute abnormality. Review of the MIP images confirms the above findings CTA HEAD FINDINGS Anterior circulation: Both internal carotid arteries widely patent to the termini without stenosis or other abnormality. A1 segments widely patent. Normal anterior communicating artery complex. Anterior cerebral arteries widely patent to their distal aspects without stenosis. Normal in stenosis or occlusion. Normal MCA bifurcations. Distal MCA branches well perfused and symmetric. Posterior circulation: Both vertebral arteries widely patent to the vertebrobasilar junction without stenosis. Both picas patent. Short fenestration involving the proximal basilar artery noted. Basilar widely patent to its distal aspect without stenosis. Superior cerebral arteries patent bilaterally. Both PCAs primarily supplied via the basilar well perfused or distal aspects. Venous sinuses: Not well assessed due to timing the contrast bolus. Anatomic variants: None significant. No intracranial aneurysm or other vascular abnormality. Review of the MIP images confirms the above findings IMPRESSION: 1. Normal  CTA of the head and neck. No large vessel occlusion, hemodynamically significant stenosis, or other acute vascular abnormality. No aneurysm. 2. No other acute intracranial abnormality. Electronically Signed   By: Rise Mu M.D.   On: 05/21/2020 00:49   MR BRAIN WO CONTRAST  Result Date: 05/21/2020 CLINICAL DATA:  Initial evaluation for acute dizziness, ataxia. EXAM: MRI HEAD WITHOUT CONTRAST TECHNIQUE: Multiplanar, multiecho pulse sequences of the brain and surrounding structures were obtained without intravenous contrast. COMPARISON:  Prior CTA from 05/20/2020. FINDINGS: Brain: Generalized age-related cerebral atrophy. No significant cerebral white matter disease for age. No abnormal foci of restricted diffusion to suggest acute or subacute ischemia. Gray-white matter differentiation maintained. No encephalomalacia to suggest chronic cortical infarction. No foci of susceptibility artifact to suggest acute or chronic intracranial hemorrhage. No mass lesion, midline shift or mass effect. No hydrocephalus or extra-axial fluid collection. Pituitary gland suprasellar region normal. Midline structures intact. Vascular: Major intracranial vascular flow voids are well maintained. Skull and upper cervical spine: Craniocervical junction within normal limits. Bone marrow signal intensity normal. No scalp soft tissue abnormality. Sinuses/Orbits: Globes and orbital soft tissues within normal limits. Mild scattered mucosal thickening noted within the ethmoidal air cells. Paranasal sinuses are otherwise clear. No mastoid effusion. Inner ear structures grossly within normal limits. Other: None. IMPRESSION: Negative brain MRI for age. No acute intracranial abnormality identified. Electronically Signed   By: Rise Mu M.D.   On: 05/21/2020 03:51   DG Chest Port 1 View  Result Date: 05/20/2020 CLINICAL DATA:  AFib with RVR EXAM: PORTABLE CHEST 1 VIEW COMPARISON:  09/03/2016 FINDINGS: The heart size and  mediastinal contours are within normal limits. Both lungs are clear. The visualized skeletal structures are unremarkable. IMPRESSION: No active disease. Electronically Signed   By: Jasmine Pang M.D.   On: 05/20/2020 18:55   ECHOCARDIOGRAM COMPLETE  Result Date: 05/21/2020    ECHOCARDIOGRAM REPORT   Patient Name:   Lisa Moore Date of Exam: 05/21/2020 Medical Rec #:  [id number]     Height:       65.0 in Accession #:    [id number]    Weight:       210.0 lb Date of Birth:  1950-12-25     BSA:          2.020 m Patient Age:    69 years      BP:           107/83 mmHg Patient Gender: F  HR:           76 bpm. Exam Location:  Inpatient Procedure: 2D Echo, Cardiac Doppler and Color Doppler Indications:    I48.0 Paroxysmal atrial fibrillation  History:        Patient has no prior history of Echocardiogram examinations.                 Risk Factors:GERD.  Sonographer:    Elmarie Shiley Dance Referring Phys: [id number] TIMOTHY S OPYD IMPRESSIONS  1. Left ventricular ejection fraction, by estimation, is 55 to 60%. The left ventricle has normal function. The left ventricle has no regional wall motion abnormalities. Left ventricular diastolic parameters are indeterminate.  2. Right ventricular systolic function is normal. The right ventricular size is normal.  3. Left atrial size was moderately dilated.  4. The mitral valve is degenerative. Mild mitral valve regurgitation. No evidence of mitral stenosis. Moderate mitral annular calcification.  5. The aortic valve is tricuspid. Aortic valve regurgitation is not visualized. No aortic stenosis is present.  6. The inferior vena cava is normal in size with greater than 50% respiratory variability, suggesting right atrial pressure of 3 mmHg. FINDINGS  Left Ventricle: Left ventricular ejection fraction, by estimation, is 55 to 60%. The left ventricle has normal function. The left ventricle has no regional wall motion abnormalities. The left ventricular internal cavity size was normal  in size. There is  no left ventricular hypertrophy. Left ventricular diastolic parameters are indeterminate. Right Ventricle: The right ventricular size is normal. No increase in right ventricular wall thickness. Right ventricular systolic function is normal. Left Atrium: Left atrial size was moderately dilated. Right Atrium: Right atrial size was normal in size. Pericardium: There is no evidence of pericardial effusion. Mitral Valve: The mitral valve is degenerative in appearance. Moderate mitral annular calcification. Mild mitral valve regurgitation. No evidence of mitral valve stenosis. Tricuspid Valve: The tricuspid valve is normal in structure. Tricuspid valve regurgitation is mild . No evidence of tricuspid stenosis. Aortic Valve: The aortic valve is tricuspid. Aortic valve regurgitation is not visualized. No aortic stenosis is present. Pulmonic Valve: The pulmonic valve was normal in structure. Pulmonic valve regurgitation is not visualized. No evidence of pulmonic stenosis. Aorta: The aortic root is normal in size and structure. Venous: The inferior vena cava is normal in size with greater than 50% respiratory variability, suggesting right atrial pressure of 3 mmHg. IAS/Shunts: No atrial level shunt detected by color flow Doppler.  LEFT VENTRICLE PLAX 2D LVIDd:         4.10 cm LVIDs:         2.80 cm LV PW:         1.00 cm LV IVS:        1.00 cm LVOT diam:     1.90 cm LV SV:         47 LV SV Index:   23 LVOT Area:     2.84 cm  RIGHT VENTRICLE          IVC RV Basal diam:  2.20 cm  IVC diam: 1.90 cm TAPSE (M-mode): 1.7 cm LEFT ATRIUM              Index       RIGHT ATRIUM           Index LA diam:        4.50 cm  2.23 cm/m  RA Area:     12.40 cm LA Vol (A2C):   111.0 ml 54.96 ml/m RA Volume:  26.40 ml  13.07 ml/m LA Vol (A4C):   84.6 ml  41.89 ml/m LA Biplane Vol: 99.0 ml  49.02 ml/m  AORTIC VALVE LVOT Vmax:   81.90 cm/s LVOT Vmean:  52.850 cm/s LVOT VTI:    0.166 m  AORTA Ao Root diam: 3.10 cm Ao Asc  diam:  3.40 cm MITRAL VALVE MV Area (PHT): 4.18 cm    SHUNTS MV Decel Time: 182 msec    Systemic VTI:  0.17 m MV E velocity: 81.65 cm/s  Systemic Diam: 1.90 cm Charlton HawsPeter Nishan MD Electronically signed by Charlton HawsPeter Nishan MD Signature Date/Time: 05/21/2020/9:58:16 AM    Final    { Assessment and Plan:   1. New onset atrial fibrillation - incidentally detected when she presented for evaluation of persistent migraine - rate now improved with IV diltiazem to 90s-low 100s and asymptomatic - LVEF normal, mild MR, TSH wnl - CHADSVASC is 2 for age and female, warranting anticoagulation - agree with Eliquis. Discussed risk/benefits and observation for bleeding - given asymptomatic nature of arrhythmia and preserved LVF suspect she would be a good candidate for rate control/anticoagulation with plan for outpatient DCCV in 3-4 weeks if still out of rhythm - will discuss with MD  2. Recent migraine with rare history of such - symptoms resolved with migraine cocktail  For questions or updates, please contact [facility] Please consult [email or web address] for contact info under    Signed, Laurann MontanaDayna N Dunn, PA-C  05/21/2020 2:15 PM   As above, patient seen and examined.  Briefly she is a 69 year old female with past medical history of migraines for evaluation of new onset atrial fibrillation.  Patient recently had migraine and was seen in urgent care.  Patient noted to have an irregular heartbeat beat and electrocardiogram revealed atrial fibrillation and she was sent to the hospital for further evaluation.  Cardiology now asked to evaluate.  She denies dyspnea, chest pain, syncope or bleeding.  Occasional palpitations in the evening.  Electrocardiogram shows atrial fibrillation.  Creatinine 0.77.  Hemoglobin 14.5.  TSH 3.286.  Echocardiogram shows normal LV function, moderate left atrial enlargement, mild mitral regurgitation.  1 persistent atrial fibrillation-patient is essentially asymptomatic.  Echocardiogram shows  normal LV function and TSH normal.  We will discontinue IV Cardizem and begin 60 mg every 8 hours.  Transition to CD tomorrow morning if heart rate stable. CHADSvasc 2.  Begin apixaban 5 mg twice daily.  We had lengthy discussions today concerning rate control versus rhythm control.  She is essentially asymptomatic.  I would favor anticoagulation for 4 to 6 weeks followed by attempt at cardioversion.  If she does not hold sinus rhythm then rate control and anticoagulation would likely be best option long-term.  Patient can likely be discharged tomorrow morning if heart rate stable.  2 migraines-Per primary care.  Olga MillersBrian Tamecka Milham, MD

## 2020-05-21 NOTE — Progress Notes (Signed)
ANTICOAGULATION CONSULT NOTE - Initial Consult  Pharmacy Consult for Apixaban Indication: atrial fibrillation  No Known Allergies  Patient Measurements: Height: 5\' 5"  (165.1 cm) Weight: 96.2 kg (212 lb) IBW/kg (Calculated) : 57  Vital Signs: Temp: 97.7 F (36.5 C) (09/07 1624) Temp Source: Oral (09/07 1624) BP: 105/92 (09/08 0000) Pulse Rate: 97 (09/08 0000)  Labs: Recent Labs    05/20/20 1909  HGB 15.2*  HCT 47.9*  PLT 296  CREATININE 0.91    Estimated Creatinine Clearance: 66 mL/min (by C-G formula based on SCr of 0.91 mg/dL).   Medical History: Past Medical History:  Diagnosis Date  . Arthritis   . Benign hematuria   . GERD (gastroesophageal reflux disease)     Medications:  See electronic med rec  Assessment: 69 y.o. F presents with new onset afib. To begin apixaban. Noted plan to send patient home with apixaban prescription.  Goal of Therapy:  Prevention of stroke Monitor platelets by anticoagulation protocol: Yes   Plan:  Apixaban 5mg  po BID - first dose now Education completed  65, PharmD, BCPS Please see amion for complete clinical pharmacist phone list 05/21/2020,1:37 AM   Addendum: Pt now starting on diltiazem gtt so likely not d/c.  Christoper Fabian, PharmD, BCPS Please see amion for complete clinical pharmacist phone list 05/21/2020 1:37 AM

## 2020-05-21 NOTE — Progress Notes (Signed)
ANTICOAGULATION CONSULT NOTE - Initial Consult  Pharmacy Consult for Apixaban Indication: atrial fibrillation  No Known Allergies  Patient Measurements: Height: 5\' 5"  (165.1 cm) Weight: 96.2 kg (212 lb) IBW/kg (Calculated) : 57  Vital Signs: Temp: 97.7 F (36.5 C) (09/07 1624) Temp Source: Oral (09/07 1624) BP: 105/92 (09/08 0000) Pulse Rate: 97 (09/08 0000)  Labs: Recent Labs    05/20/20 1909  HGB 15.2*  HCT 47.9*  PLT 296  CREATININE 0.91    Estimated Creatinine Clearance: 66 mL/min (by C-G formula based on SCr of 0.91 mg/dL).   Medical History: Past Medical History:  Diagnosis Date  . Arthritis   . Benign hematuria   . GERD (gastroesophageal reflux disease)     Medications:  See electronic med rec  Assessment: 69 y.o. F presents with new onset afib. To begin apixaban. Noted plan to send patient home with apixaban prescription.  Goal of Therapy:  Prevention of stroke Monitor platelets by anticoagulation protocol: Yes   Plan:  Apixaban 5mg  po BID - first dose now Education completed  65, PharmD, BCPS Please see amion for complete clinical pharmacist phone list 05/21/2020,1:29 AM

## 2020-05-21 NOTE — ED Provider Notes (Addendum)
Assumed care of patient.  Eliquis consult ordered.   Informed by nurse patient was back in AFIB with RVR and diltiazem drip initiated and patient to be admitted.      Xan Sparkman, MD 05/21/20 [phone]

## 2020-05-21 NOTE — TOC Benefit Eligibility Note (Signed)
Transition of Care [facility]) Benefit Eligibility Note    Patient Details  Name: Lisa Moore MRN: [id number] Date of Birth: 05/16/1951   Medication/Dose: Eliquis 84m bid  Covered?: Yes     Prescription Coverage [facility]erred [facility]: [facility]  Spoke with Person/Company/Phone Number:: [employer]  Co-Pay: $362.25 due to deducitble after this is met co-pay is $47 at nonpreferred ([facility]) or $35 at preferred ([facility]) for 30 day retail  Prior Approval: No  Deductible:  ($315.25 remaining)       [place]: 05/21/2020, 2:28 PM

## 2020-05-21 NOTE — Progress Notes (Signed)
  Echocardiogram 2D Echocardiogram has been performed.  Dorena Dew Daruis Swaim 05/21/2020, 9:53 AM

## 2020-05-21 NOTE — H&P (Signed)
History and Physical    Lisa Moore KVQ:[id number] DOB: [birth year] DOA: 05/20/2020  PCP: Orland Mustard, MD   Patient coming from: Home   Chief Complaint: Difficulty with balance, headache, new a fib at PCP office   HPI: Lisa Moore is a 69 y.o. female with history of migraines, now presenting to the emergency department at the direction of her PCP for evaluation of new atrial fibrillation and gait difficulty.  Patient reports that she developed a right-sided headache on 05/17/2020 that was similar to migraines that she has had in the past, but was also feeling off balance when standing or trying to ambulate, feeling as though she was falling to her right side, and sought evaluation at her PCP clinic for this.  In the clinic, she was found to be in atrial fibrillation with no history of such and was sent to the ED for further evaluation.  Patient denies any recent palpitations, denies any chest pain, denies fevers or cough, denies any alcohol use, and denies any leg swelling or tenderness.    ED Course: Upon arrival to the ED, patient is found to be afebrile, saturating mid 90s on room air, tachycardic to 130, and with stable blood pressure.  EKG features atrial fibrillation with rate 115.  Chest x-ray is negative for acute cardiopulmonary disease.  CTA head and neck is a normal study.  Chemistry panel is unremarkable.  CBC with mild polycythemia.  Patient was initially rate controlled after an injection of 10 mg IV diltiazem and cardiology recommended starting her on Eliquis and having her follow-up in the atrial fibrillation clinic but rate became rapid again, remained rapid with stable blood pressure after another injection of diltiazem and she was started on diltiazem infusion.  COVID-19 screening test has not yet resulted.  Review of Systems:  All other systems reviewed and apart from HPI, are negative.  Past Medical History:  Diagnosis Date  . Arthritis   . Benign hematuria   . GERD  (gastroesophageal reflux disease)     Past Surgical History:  Procedure Laterality Date  . ABDOMINAL HYSTERECTOMY    . APPENDECTOMY      Social History:   reports that she has never smoked. She has never used smokeless tobacco. She reports that she does not drink alcohol and does not use drugs.  No Known Allergies  Family History  Problem Relation Age of Onset  . Hypertension Mother   . Cancer Father   . COPD Father   . Diabetes Father   . Cancer Sister   . Kidney disease Maternal Grandmother   . Heart attack Maternal Grandfather   . Cancer Paternal Grandmother   . Heart attack Paternal Grandfather      Prior to Admission medications   Medication Sig Start Date End Date Taking? Authorizing Provider  naproxen sodium (ALEVE) 220 MG tablet Take 220 mg by mouth daily as needed.    [provider]  apixaban (ELIQUIS) 5 MG TABS tablet Take 1 tablet (5 mg total) by mouth 2 (two) times daily. 05/21/20 05/21/20  Palumbo, April, MD  diltiazem (CARDIZEM CD) 120 MG 24 hr capsule Take 1 capsule (120 mg total) by mouth daily. 05/21/20 05/21/20  Palumbo, April, MD    Physical Exam: Vitals:   05/20/20 2130 05/21/20 0000 05/21/20 0130 05/21/20 0204  BP: (!) 125/95 (!) 105/92 (!) 128/116 126/90  Pulse: 94 97 63 94  Resp: 15 19 (!) 29 18  Temp:      TempSrc:  SpO2: 96% 95% 97% 95%  Weight:      Height:         Constitutional: NAD, calm  Eyes: PERTLA, lids and conjunctivae normal ENMT: Mucous membranes are moist. Posterior pharynx clear of any exudate or lesions.   Neck: normal, supple, no masses, no thyromegaly Respiratory: no wheezing, no crackles. No accessory muscle use.  Cardiovascular: Rate ~120 and irregularly irregular. No extremity edema.   Abdomen: No distension, no tenderness, soft. Bowel sounds active.  Musculoskeletal: no clubbing / cyanosis. No joint deformity upper and lower extremities.   Skin: no significant rashes, lesions, ulcers. Warm, dry,  well-perfused. Neurologic: CN 2-12 grossly intact. Sensation intact. Strength 5/5 in all 4 limbs.  Psychiatric: Alert and oriented to person, place, and situation. Pleasant and cooperative.   Labs and Imaging on Admission: I have personally reviewed following labs and imaging studies  CBC: Recent Labs  Lab 05/20/20 1909  WBC 9.1  NEUTROABS 5.3  HGB 15.2*  HCT 47.9*  MCV 91.2  PLT 296   Basic Metabolic Panel: Recent Labs  Lab 05/20/20 1909  NA 143  K 5.0  CL 107  CO2 26  GLUCOSE 93  BUN 17  CREATININE 0.91  CALCIUM 9.8   GFR: Estimated Creatinine Clearance: 66 mL/min (by C-G formula based on SCr of 0.91 mg/dL). Liver Function Tests: Recent Labs  Lab 05/20/20 1909  AST 22  ALT 15  ALKPHOS 70  BILITOT 0.6  PROT 7.4  ALBUMIN 4.0   No results for input(s): LIPASE, AMYLASE in the last 168 hours. No results for input(s): AMMONIA in the last 168 hours. Coagulation Profile: No results for input(s): INR, PROTIME in the last 168 hours. Cardiac Enzymes: No results for input(s): CKTOTAL, CKMB, CKMBINDEX, TROPONINI in the last 168 hours. BNP (last 3 results) No results for input(s): PROBNP in the last 8760 hours. HbA1C: No results for input(s): HGBA1C in the last 72 hours. CBG: No results for input(s): GLUCAP in the last 168 hours. Lipid Profile: No results for input(s): CHOL, HDL, LDLCALC, TRIG, CHOLHDL, LDLDIRECT in the last 72 hours. Thyroid Function Tests: No results for input(s): TSH, T4TOTAL, FREET4, T3FREE, THYROIDAB in the last 72 hours. Anemia Panel: No results for input(s): VITAMINB12, FOLATE, FERRITIN, TIBC, IRON, RETICCTPCT in the last 72 hours. Urine analysis:    Component Value Date/Time   COLORURINE AMBER (A) 09/03/2016 1331   APPEARANCEUR HAZY (A) 09/03/2016 1331   LABSPEC 1.029 09/03/2016 1331   PHURINE 5.0 09/03/2016 1331   GLUCOSEU NEGATIVE 09/03/2016 1331   HGBUR MODERATE (A) 09/03/2016 1331   BILIRUBINUR SMALL (A) 09/03/2016 1331    KETONESUR 5 (A) 09/03/2016 1331   PROTEINUR 30 (A) 09/03/2016 1331   UROBILINOGEN 1.0 01/31/2014 1255   NITRITE NEGATIVE 09/03/2016 1331   LEUKOCYTESUR MODERATE (A) 09/03/2016 1331   Sepsis Labs: @LABRCNTIP (procalcitonin:4,lacticidven:4) )No results found for this or any previous visit (from the past 240 hour(s)).   Radiological Exams on Admission: CT Angio Head W or Wo Contrast  Result Date: 05/21/2020 CLINICAL DATA:  Initial evaluation for acute headache. EXAM: CT ANGIOGRAPHY HEAD AND NECK TECHNIQUE: Multidetector CT imaging of the head and neck was performed using the standard protocol during bolus administration of intravenous contrast. Multiplanar CT image reconstructions and MIPs were obtained to evaluate the vascular anatomy. Carotid stenosis measurements (when applicable) are obtained utilizing NASCET criteria, using the distal internal carotid diameter as the denominator. CONTRAST:  75mL OMNIPAQUE IOHEXOL 350 MG/ML SOLN COMPARISON:  None. FINDINGS: CT HEAD FINDINGS Brain:  Cerebral volume within normal limits for patient age. No evidence for acute intracranial hemorrhage. No findings to suggest acute large vessel territory infarct. No mass lesion, midline shift, or mass effect. Ventricles are normal in size without evidence for hydrocephalus. No extra-axial fluid collection identified. Vascular: No hyperdense vessel identified. Skull: Scalp soft tissues demonstrate no acute abnormality. Calvarium intact. Sinuses/Orbits: Globes and orbital soft tissues within normal limits. Visualized paranasal sinuses are clear. No mastoid effusion. CTA NECK FINDINGS Aortic arch: Visualized aortic arch of normal caliber with normal branch pattern. No flow-limiting stenosis or other abnormality about the origin of the great vessels. Right carotid system: Right common and internal carotid arteries widely patent without stenosis, dissection or occlusion. Left carotid system: Left common and internal carotid arteries  widely patent without stenosis, dissection or occlusion. Vertebral arteries: Both vertebral arteries arise from the subclavian arteries. No proximal subclavian artery stenosis. Vertebral arteries widely patent without stenosis, dissection or occlusion. Skeleton: No acute osseous abnormality. No discrete or worrisome osseous lesions. Other neck: No other acute soft tissue abnormality within the neck. No mass lesion or adenopathy. Upper chest: Visualized upper chest demonstrates no other acute abnormality. Review of the MIP images confirms the above findings CTA HEAD FINDINGS Anterior circulation: Both internal carotid arteries widely patent to the termini without stenosis or other abnormality. A1 segments widely patent. Normal anterior communicating artery complex. Anterior cerebral arteries widely patent to their distal aspects without stenosis. Normal in stenosis or occlusion. Normal MCA bifurcations. Distal MCA branches well perfused and symmetric. Posterior circulation: Both vertebral arteries widely patent to the vertebrobasilar junction without stenosis. Both picas patent. Short fenestration involving the proximal basilar artery noted. Basilar widely patent to its distal aspect without stenosis. Superior cerebral arteries patent bilaterally. Both PCAs primarily supplied via the basilar well perfused or distal aspects. Venous sinuses: Not well assessed due to timing the contrast bolus. Anatomic variants: None significant. No intracranial aneurysm or other vascular abnormality. Review of the MIP images confirms the above findings IMPRESSION: 1. Normal CTA of the head and neck. No large vessel occlusion, hemodynamically significant stenosis, or other acute vascular abnormality. No aneurysm. 2. No other acute intracranial abnormality. Electronically Signed   By: Rise Mu M.D.   On: 05/21/2020 00:49   CT Angio Neck W and/or Wo Contrast  Result Date: 05/21/2020 CLINICAL DATA:  Initial evaluation for  acute headache. EXAM: CT ANGIOGRAPHY HEAD AND NECK TECHNIQUE: Multidetector CT imaging of the head and neck was performed using the standard protocol during bolus administration of intravenous contrast. Multiplanar CT image reconstructions and MIPs were obtained to evaluate the vascular anatomy. Carotid stenosis measurements (when applicable) are obtained utilizing NASCET criteria, using the distal internal carotid diameter as the denominator. CONTRAST:  64mL OMNIPAQUE IOHEXOL 350 MG/ML SOLN COMPARISON:  None. FINDINGS: CT HEAD FINDINGS Brain: Cerebral volume within normal limits for patient age. No evidence for acute intracranial hemorrhage. No findings to suggest acute large vessel territory infarct. No mass lesion, midline shift, or mass effect. Ventricles are normal in size without evidence for hydrocephalus. No extra-axial fluid collection identified. Vascular: No hyperdense vessel identified. Skull: Scalp soft tissues demonstrate no acute abnormality. Calvarium intact. Sinuses/Orbits: Globes and orbital soft tissues within normal limits. Visualized paranasal sinuses are clear. No mastoid effusion. CTA NECK FINDINGS Aortic arch: Visualized aortic arch of normal caliber with normal branch pattern. No flow-limiting stenosis or other abnormality about the origin of the great vessels. Right carotid system: Right common and internal carotid arteries widely patent without  stenosis, dissection or occlusion. Left carotid system: Left common and internal carotid arteries widely patent without stenosis, dissection or occlusion. Vertebral arteries: Both vertebral arteries arise from the subclavian arteries. No proximal subclavian artery stenosis. Vertebral arteries widely patent without stenosis, dissection or occlusion. Skeleton: No acute osseous abnormality. No discrete or worrisome osseous lesions. Other neck: No other acute soft tissue abnormality within the neck. No mass lesion or adenopathy. Upper chest: Visualized  upper chest demonstrates no other acute abnormality. Review of the MIP images confirms the above findings CTA HEAD FINDINGS Anterior circulation: Both internal carotid arteries widely patent to the termini without stenosis or other abnormality. A1 segments widely patent. Normal anterior communicating artery complex. Anterior cerebral arteries widely patent to their distal aspects without stenosis. Normal in stenosis or occlusion. Normal MCA bifurcations. Distal MCA branches well perfused and symmetric. Posterior circulation: Both vertebral arteries widely patent to the vertebrobasilar junction without stenosis. Both picas patent. Short fenestration involving the proximal basilar artery noted. Basilar widely patent to its distal aspect without stenosis. Superior cerebral arteries patent bilaterally. Both PCAs primarily supplied via the basilar well perfused or distal aspects. Venous sinuses: Not well assessed due to timing the contrast bolus. Anatomic variants: None significant. No intracranial aneurysm or other vascular abnormality. Review of the MIP images confirms the above findings IMPRESSION: 1. Normal CTA of the head and neck. No large vessel occlusion, hemodynamically significant stenosis, or other acute vascular abnormality. No aneurysm. 2. No other acute intracranial abnormality. Electronically Signed   By: Rise Mu M.D.   On: 05/21/2020 00:49   DG Chest Port 1 View  Result Date: 05/20/2020 CLINICAL DATA:  AFib with RVR EXAM: PORTABLE CHEST 1 VIEW COMPARISON:  09/03/2016 FINDINGS: The heart size and mediastinal contours are within normal limits. Both lungs are clear. The visualized skeletal structures are unremarkable. IMPRESSION: No active disease. Electronically Signed   By: Jasmine Pang M.D.   On: 05/20/2020 18:55    EKG: Independently reviewed. Atrial fibrillation, rate 115.   Assessment/Plan  1. New atrial fibrillation with RVR - Patient saw her PCP on 9/7 for headache and  difficulty with balance, was found to be in new atrial fibrillation, and sent to ED where she was treated with 10 mg IV diltiazem and Eliquis in consultation with cardiology and planned for outpatient follow-up but rate became rapid again, remained in 110s after repeat IV dilt bolus, and was started on diltiazem infusion  - Time of onset unclear as she denies any palpitations  - CHADS-VASc at least 2 for gender and age  - Continue cardiac monitoring, continue diltiazem infusion, continue Eliquis, check TSH, check magnesium level, and check echocardiogram   2. Ataxia  - Patient reports sensation of disequilibrium and falling to her right while standing  - CTA head and neck are normal  - Check MRI brain, consult PT    DVT prophylaxis: Eliquis  Code Status: Full  Family Communication: Discussed with patient  Disposition Plan:  Patient is from: Home  Anticipated d/c is to: Home  Anticipated d/c date is: 05/22/20 Patient currently: in new atrial fibrillation with RVR  Consults called: None  Admission status: Observation     Briscoe Deutscher, MD [facility]  05/21/2020, 2:15 AM

## 2020-05-21 NOTE — ED Notes (Signed)
Pt ambulated to restroom with steady gait. Pt returned to bed and is on monitors.  

## 2020-05-21 NOTE — Evaluation (Signed)
Physical Therapy Evaluation and Discharge Patient Details Name: Lisa Moore MRN: [id number] DOB: November 16, 1950 Today's Date: 05/21/2020   History of Present Illness  Pt is a 69 y/o female admitted secondary to gait instability and a fib with RVR. Pt reports instability has resolved. MRI negative for acute abnormality. PMH includes arthritis.  Clinical Impression  Patient evaluated by Physical Therapy with no further acute PT needs identified. All education has been completed and the patient has no further questions. Pt overall at a Mod I level with all mobility tasks. Able to perform dynamic gait tasks without LOB and pt reports she feels she is back at baseline. HR elevated to 123 bpm during gait. See below for any follow-up Physical Therapy or equipment needs. PT is signing off. Thank you for this referral. If needs change, please re-consult.     Follow Up Recommendations No PT follow up    Equipment Recommendations  None recommended by PT    Recommendations for Other Services       Precautions / Restrictions Precautions Precautions: None Restrictions Weight Bearing Restrictions: No      Mobility  Bed Mobility Overal bed mobility: Independent                Transfers Overall transfer level: Modified independent               General transfer comment: increased time but no physical assist required.   Ambulation/Gait Ambulation/Gait assistance: Modified independent (Device/Increase time) Gait Distance (Feet): 120 Feet Assistive device: None Gait Pattern/deviations: WFL(Within Functional Limits) Gait velocity: mildly slower   General Gait Details: Slower speed, however overall steady. Able to perform horizontal/vertical head turns, step over object, and weave in between objects without LOB. HR elevated to 123 during gait.   Stairs            Wheelchair Mobility    Modified Rankin (Stroke Patients Only)       Balance Overall balance assessment:  Modified Independent                                           Pertinent Vitals/Pain Pain Assessment: No/denies pain    Home Living Family/patient expects to be discharged to:: Private residence Living Arrangements: Alone Available Help at Discharge: Family;Available PRN/intermittently;Neighbor Type of Home: House Home Access: Level entry     Home Layout: One level Home Equipment: Cane - single point;Walker - 2 wheels      Prior Function Level of Independence: Independent               Hand Dominance        Extremity/Trunk Assessment   Upper Extremity Assessment Upper Extremity Assessment: Overall WFL for tasks assessed    Lower Extremity Assessment Lower Extremity Assessment: Overall WFL for tasks assessed    Cervical / Trunk Assessment Cervical / Trunk Assessment: Normal  Communication   Communication: No difficulties  Cognition Arousal/Alertness: Awake/alert Behavior During Therapy: WFL for tasks assessed/performed Overall Cognitive Status: Within Functional Limits for tasks assessed                                        General Comments      Exercises     Assessment/Plan    PT Assessment Patent does not need any further  PT services  PT Problem List         PT Treatment Interventions      PT Goals (Current goals can be found in the Care Plan section)  Acute Rehab PT Goals Patient Stated Goal: to go home PT Goal Formulation: With patient Time For Goal Achievement: 06/04/20 Potential to Achieve Goals: Good    Frequency     Barriers to discharge        Co-evaluation               AM-PAC PT "6 Clicks" Mobility  Outcome Measure Help needed turning from your back to your side while in a flat bed without using bedrails?: None Help needed moving from lying on your back to sitting on the side of a flat bed without using bedrails?: None Help needed moving to and from a bed to a chair (including a  wheelchair)?: None Help needed standing up from a chair using your arms (e.g., wheelchair or bedside chair)?: None Help needed to walk in hospital room?: None Help needed climbing 3-5 steps with a railing? : None 6 Click Score: 24    End of Session Equipment Utilized During Treatment: Gait belt Activity Tolerance: Patient tolerated treatment well Patient left: in bed;with call bell/phone within reach Nurse Communication: Mobility status PT Visit Diagnosis: Difficulty in walking, not elsewhere classified (R26.2)    Time: 1040-1053 PT Time Calculation (min) (ACUTE ONLY): 13 min   Charges:   PT Evaluation $PT Eval Low Complexity: 1 Low          Cindee Salt, DPT  Acute Rehabilitation Services  Pager: [phone] Office: [phone]   Lehman Prom 05/21/2020, 2:30 PM

## 2020-05-21 NOTE — Progress Notes (Signed)
No charge note  Patient seen and examined personally, I reviewed the chart, history and physical and admission note, done by admitting physician this morning and agree with the same with following addendum.  Please refer to the morning admission note for more detailed plan of care.  Briefly, 69 year old female with history of migraine seen by PCP found to have new A. fib and patient was also leaning towards right.  She was seen in the ED and underwent extensive evaluation EKG with A. fib uncontrolled heart rate, CTA head and neck no acute finding, MRI brain no acute finding.  Patient was admitted for further management.  Denies chest pain palpitation Never had chest pain No more leaning to rt or headache which are resolved.  Unsteady gait:?  Etiology but currently resolved.MRI brain was negative.  Persistent atrial fibrillation:TSH normal.On cardize gtt at 5.  Echo pending and also already on anticoagulant Eliquis.  Currently has been consulted.  Monitor the patient overnight to make sure the hospital remains controlled on current medication and anticipating discharge tomorrow if HR remains stable.

## 2020-05-21 NOTE — ED Notes (Signed)
Attempted report. Could not take

## 2020-05-21 NOTE — Care Management (Signed)
Case manager submitted request for benefits check for copay amount for Eliquis. Vance Peper, RN BSN Case Manager [phone]

## 2020-05-22 LAB — CBC
HCT: 44.8 % (ref 36.0–46.0)
Hemoglobin: 14.7 g/dL (ref 12.0–15.0)
MCH: 29.6 pg (ref 26.0–34.0)
MCHC: 32.8 g/dL (ref 30.0–36.0)
MCV: 90.3 fL (ref 80.0–100.0)
Platelets: 299 10*3/uL (ref 150–400)
RBC: 4.96 MIL/uL (ref 3.87–5.11)
RDW: 13.2 % (ref 11.5–15.5)
WBC: 7.1 10*3/uL (ref 4.0–10.5)
nRBC: 0 % (ref 0.0–0.2)

## 2020-05-22 LAB — BASIC METABOLIC PANEL
Anion gap: 10 (ref 5–15)
BUN: 17 mg/dL (ref 8–23)
CO2: 23 mmol/L (ref 22–32)
Calcium: 9.3 mg/dL (ref 8.9–10.3)
Chloride: 107 mmol/L (ref 98–111)
Creatinine, Ser: 0.78 mg/dL (ref 0.44–1.00)
GFR calc Af Amer: >60 mL/min (ref >60)
GFR calc non Af Amer: >60 mL/min (ref >60)
Glucose, Bld: 160 mg/dL — ABNORMAL HIGH (ref 70–99)
Potassium: 3.5 mmol/L (ref 3.5–5.1)
Sodium: 140 mmol/L (ref 135–145)

## 2020-05-22 MED ORDER — DILTIAZEM HCL ER COATED BEADS 180 MG PO CP24: 180.0000 mg | ORAL_CAPSULE | Freq: Every day | ORAL | Status: DC

## 2020-05-22 MED ORDER — APIXABAN 5 MG PO TABS: 5.0000 mg | ORAL_TABLET | Freq: Two times a day (BID) | ORAL | 6 refills | Status: DC

## 2020-05-22 MED ORDER — DILTIAZEM HCL ER COATED BEADS 180 MG PO CP24: 180.0000 mg | ORAL_CAPSULE | Freq: Every day | ORAL | 6 refills | Status: DC

## 2020-05-22 NOTE — Discharge Summary (Signed)
Physician Discharge Summary  Lisa Moore NKN:[id number] DOB: Apr 11, 1951 DOA: 05/20/2020  PCP: Lisa Mustard, MD  Admit date: 05/20/2020 Discharge date: 05/22/2020  Admitted From: home Disposition:  home  Recommendations for Outpatient Follow-up:  Follow up with PCP/cardiology as instructed  Home Health:no  Equipment/Devices: none  Discharge Condition: Stable Code Status:   Code Status: Prior Diet recommendation:  Diet Order             Diet - low sodium heart healthy                    Brief/Interim Summary: 69 year old female with history of migraine seen by PCP found to have new A. fib and patient was also leaning towards right.  She was seen in the ED and underwent extensive evaluation EKG with A. fib uncontrolled heart rate, CTA head and neck no acute finding, MRI brain no acute finding.  Patient was admitted for further management. Patient was seen by cardiologist he was anticoagulated. Underwent incisional cardiogram unremarkable. Heart rate is controlled and will be discharged on oral Eliquis and Cardizem CD  Discharge Diagnoses:    Persistent atrial fibrillation:TSH normal.echocardiogram normal, discussed with cardiology and stable for discharge home on Eliquis and Cardizem CD and follow-up with the A. fib clinic   Unsteady gait:?  Etiology but currently resolved. Wonder if it is from A. fib with RVR.  MRI brain was negative for acute stroke.  Nonfocal on exam.   Currently no issues  Patient needed to be hospitalized for 2 midnight due to her new onset atrial fibrillation and for further evaluation by specialist and work-up with TSH echocardiogram and stabilization of the heart rate with Cardizem infusion and monitoring of tolerance of anticoagulation  Consults: cardio  Subjective: Resting. Hr stable in a fib Feels ready for home  Discharge Exam: Vitals:   05/21/20 2307 05/22/20 0640  BP: 117/84 117/88  Pulse: 86 89  Resp:  16  Temp:  97.8 F (36.6 C)   SpO2:  96%   General:Pt is alert, awake, not in acute distress Cardiovascular:RRR, S1/S2 +, no rubs, no gallops Respiratory:CTA bilaterally, no wheezing, no rhonchi Abdominal:Soft, NT, ND, bowel sounds + Extremities:No edema, no cyanosis  Discharge Instructions  Discharge Instructions     Diet - low sodium heart healthy   Complete by: As directed    Discharge instructions   Complete by: As directed    Please call call MD or return to ER for similar or worsening recurring problem that brought you to hospital or if any fever,nausea/vomiting,abdominal pain, uncontrolled pain, chest pain,  shortness of breath or any other alarming symptoms.  Please follow up at atrial fib clinic/cardiology\  Please follow-up your doctor as instructed in a week time and call the office for appointment.   Please avoid alcohol, smoking, or any other illicit substance and maintain healthy habits including taking your regular medications as prescribed.  You were cared for by a hospitalist during your hospital stay. If you have any questions about your discharge medications or the care you received while you were in the hospital after you are discharged, you can call the unit and ask to speak with the hospitalist on call if the hospitalist that took care of you is not available.  Once you are discharged, your primary care physician will handle any further medical issues. Please note that NO REFILLS for any discharge medications will be authorized once you are discharged, as it is imperative that you return to your  primary care physician (or establish a relationship with a primary care physician if you do not have one) for your aftercare needs so that they can reassess your need for medications and monitor your lab values   Increase activity slowly   Complete by: As directed       Allergies as of 05/22/2020   No Known Allergies      Medication List     STOP taking these medications    Aleve 220 MG  tablet Generic drug: naproxen sodium       TAKE these medications    apixaban 5 MG Tabs tablet Commonly known as: ELIQUIS Take 1 tablet (5 mg total) by mouth 2 (two) times daily.   diltiazem 180 MG 24 hr capsule Commonly known as: CARDIZEM CD Take 1 capsule (180 mg total) by mouth daily.        Follow-up Information     [facility] Follow up.   Specialty: Cardiology Why: A follow-up visit has been scheduled for you in our atrial fib clinic (here at [facility]) on Thursday Jun 19, 2020 at 10:00 AM. Please call the clinic closer to your appointment date for garage code. Contact information: [address] [id number] mc [place] [address] [phone]               No Known Allergies  The results of significant diagnostics from this hospitalization (including imaging, microbiology, ancillary and laboratory) are listed below for reference.    Microbiology: Recent Results (from the past 240 hour(s))  SARS Coronavirus 2 by RT PCR (hospital order, performed in [facility] hospital lab) Nasopharyngeal Nasopharyngeal Swab     Status: None   Collection Time: 05/21/20  2:08 AM   Specimen: Nasopharyngeal Swab  Result Value Ref Range Status   SARS Coronavirus 2 NEGATIVE NEGATIVE Final    Comment: (NOTE) SARS-CoV-2 target nucleic acids are NOT DETECTED.  The SARS-CoV-2 RNA is generally detectable in upper and lower respiratory specimens during the acute phase of infection. The lowest concentration of SARS-CoV-2 viral copies this assay can detect is 250 copies / mL. A negative result does not preclude SARS-CoV-2 infection and should not be used as the sole basis for treatment or other patient management decisions.  A negative result may occur with improper specimen collection / handling, submission of specimen other than nasopharyngeal swab, presence of viral mutation(s) within the areas  targeted by this assay, and inadequate number of viral copies (<250 copies / mL). A negative result must be combined with clinical observations, patient history, and epidemiological information.  Fact Sheet for Patients:   BoilerBrush.com.cy  Fact Sheet for Healthcare Providers: https://pope.com/  This test is not yet approved or  cleared by the [place] FDA and has been authorized for detection and/or diagnosis of SARS-CoV-2 by FDA under an Emergency Use Authorization (EUA).  This EUA will remain in effect (meaning this test can be used) for the duration of the COVID-19 declaration under Section 564(b)(1) of the Act, 21 U.S.C. section 360bbb-3(b)(1), unless the authorization is terminated or revoked sooner.  Performed at [facility], [address], [place], [place] [address]     Procedures/Studies: CT Angio Head W or Wo Contrast  Result Date: 05/21/2020 CLINICAL DATA:  Initial evaluation for acute headache. EXAM: CT ANGIOGRAPHY HEAD AND NECK TECHNIQUE: Multidetector CT imaging of the head and neck was performed using the standard protocol during bolus administration of intravenous  contrast. Multiplanar CT image reconstructions and MIPs were obtained to evaluate the vascular anatomy. Carotid stenosis measurements (when applicable) are obtained utilizing NASCET criteria, using the distal internal carotid diameter as the denominator. CONTRAST:  75mL OMNIPAQUE IOHEXOL 350 MG/ML SOLN COMPARISON:  None. FINDINGS: CT HEAD FINDINGS Brain: Cerebral volume within normal limits for patient age. No evidence for acute intracranial hemorrhage. No findings to suggest acute large vessel territory infarct. No mass lesion, midline shift, or mass effect. Ventricles are normal in size without evidence for hydrocephalus. No extra-axial fluid collection identified. Vascular: No hyperdense vessel identified. Skull: Scalp soft tissues demonstrate no acute  abnormality. Calvarium intact. Sinuses/Orbits: Globes and orbital soft tissues within normal limits. Visualized paranasal sinuses are clear. No mastoid effusion. CTA NECK FINDINGS Aortic arch: Visualized aortic arch of normal caliber with normal branch pattern. No flow-limiting stenosis or other abnormality about the origin of the great vessels. Right carotid system: Right common and internal carotid arteries widely patent without stenosis, dissection or occlusion. Left carotid system: Left common and internal carotid arteries widely patent without stenosis, dissection or occlusion. Vertebral arteries: Both vertebral arteries arise from the subclavian arteries. No proximal subclavian artery stenosis. Vertebral arteries widely patent without stenosis, dissection or occlusion. Skeleton: No acute osseous abnormality. No discrete or worrisome osseous lesions. Other neck: No other acute soft tissue abnormality within the neck. No mass lesion or adenopathy. Upper chest: Visualized upper chest demonstrates no other acute abnormality. Review of the MIP images confirms the above findings CTA HEAD FINDINGS Anterior circulation: Both internal carotid arteries widely patent to the termini without stenosis or other abnormality. A1 segments widely patent. Normal anterior communicating artery complex. Anterior cerebral arteries widely patent to their distal aspects without stenosis. Normal in stenosis or occlusion. Normal MCA bifurcations. Distal MCA branches well perfused and symmetric. Posterior circulation: Both vertebral arteries widely patent to the vertebrobasilar junction without stenosis. Both picas patent. Short fenestration involving the proximal basilar artery noted. Basilar widely patent to its distal aspect without stenosis. Superior cerebral arteries patent bilaterally. Both PCAs primarily supplied via the basilar well perfused or distal aspects. Venous sinuses: Not well assessed due to timing the contrast bolus.  Anatomic variants: None significant. No intracranial aneurysm or other vascular abnormality. Review of the MIP images confirms the above findings IMPRESSION: 1. Normal CTA of the head and neck. No large vessel occlusion, hemodynamically significant stenosis, or other acute vascular abnormality. No aneurysm. 2. No other acute intracranial abnormality. Electronically Signed   By: Rise Mu M.D.   On: 05/21/2020 00:49   CT Angio Neck W and/or Wo Contrast  Result Date: 05/21/2020 CLINICAL DATA:  Initial evaluation for acute headache. EXAM: CT ANGIOGRAPHY HEAD AND NECK TECHNIQUE: Multidetector CT imaging of the head and neck was performed using the standard protocol during bolus administration of intravenous contrast. Multiplanar CT image reconstructions and MIPs were obtained to evaluate the vascular anatomy. Carotid stenosis measurements (when applicable) are obtained utilizing NASCET criteria, using the distal internal carotid diameter as the denominator. CONTRAST:  75mL OMNIPAQUE IOHEXOL 350 MG/ML SOLN COMPARISON:  None. FINDINGS: CT HEAD FINDINGS Brain: Cerebral volume within normal limits for patient age. No evidence for acute intracranial hemorrhage. No findings to suggest acute large vessel territory infarct. No mass lesion, midline shift, or mass effect. Ventricles are normal in size without evidence for hydrocephalus. No extra-axial fluid collection identified. Vascular: No hyperdense vessel identified. Skull: Scalp soft tissues demonstrate no acute abnormality. Calvarium intact. Sinuses/Orbits: Globes and orbital soft tissues within normal  limits. Visualized paranasal sinuses are clear. No mastoid effusion. CTA NECK FINDINGS Aortic arch: Visualized aortic arch of normal caliber with normal branch pattern. No flow-limiting stenosis or other abnormality about the origin of the great vessels. Right carotid system: Right common and internal carotid arteries widely patent without stenosis, dissection  or occlusion. Left carotid system: Left common and internal carotid arteries widely patent without stenosis, dissection or occlusion. Vertebral arteries: Both vertebral arteries arise from the subclavian arteries. No proximal subclavian artery stenosis. Vertebral arteries widely patent without stenosis, dissection or occlusion. Skeleton: No acute osseous abnormality. No discrete or worrisome osseous lesions. Other neck: No other acute soft tissue abnormality within the neck. No mass lesion or adenopathy. Upper chest: Visualized upper chest demonstrates no other acute abnormality. Review of the MIP images confirms the above findings CTA HEAD FINDINGS Anterior circulation: Both internal carotid arteries widely patent to the termini without stenosis or other abnormality. A1 segments widely patent. Normal anterior communicating artery complex. Anterior cerebral arteries widely patent to their distal aspects without stenosis. Normal in stenosis or occlusion. Normal MCA bifurcations. Distal MCA branches well perfused and symmetric. Posterior circulation: Both vertebral arteries widely patent to the vertebrobasilar junction without stenosis. Both picas patent. Short fenestration involving the proximal basilar artery noted. Basilar widely patent to its distal aspect without stenosis. Superior cerebral arteries patent bilaterally. Both PCAs primarily supplied via the basilar well perfused or distal aspects. Venous sinuses: Not well assessed due to timing the contrast bolus. Anatomic variants: None significant. No intracranial aneurysm or other vascular abnormality. Review of the MIP images confirms the above findings IMPRESSION: 1. Normal CTA of the head and neck. No large vessel occlusion, hemodynamically significant stenosis, or other acute vascular abnormality. No aneurysm. 2. No other acute intracranial abnormality. Electronically Signed   By: Rise Mu M.D.   On: 05/21/2020 00:49   MR BRAIN WO  CONTRAST  Result Date: 05/21/2020 CLINICAL DATA:  Initial evaluation for acute dizziness, ataxia. EXAM: MRI HEAD WITHOUT CONTRAST TECHNIQUE: Multiplanar, multiecho pulse sequences of the brain and surrounding structures were obtained without intravenous contrast. COMPARISON:  Prior CTA from 05/20/2020. FINDINGS: Brain: Generalized age-related cerebral atrophy. No significant cerebral white matter disease for age. No abnormal foci of restricted diffusion to suggest acute or subacute ischemia. Gray-white matter differentiation maintained. No encephalomalacia to suggest chronic cortical infarction. No foci of susceptibility artifact to suggest acute or chronic intracranial hemorrhage. No mass lesion, midline shift or mass effect. No hydrocephalus or extra-axial fluid collection. Pituitary gland suprasellar region normal. Midline structures intact. Vascular: Major intracranial vascular flow voids are well maintained. Skull and upper cervical spine: Craniocervical junction within normal limits. Bone marrow signal intensity normal. No scalp soft tissue abnormality. Sinuses/Orbits: Globes and orbital soft tissues within normal limits. Mild scattered mucosal thickening noted within the ethmoidal air cells. Paranasal sinuses are otherwise clear. No mastoid effusion. Inner ear structures grossly within normal limits. Other: None. IMPRESSION: Negative brain MRI for age. No acute intracranial abnormality identified. Electronically Signed   By: Rise Mu M.D.   On: 05/21/2020 03:51   DG Chest Port 1 View  Result Date: 05/20/2020 CLINICAL DATA:  AFib with RVR EXAM: PORTABLE CHEST 1 VIEW COMPARISON:  09/03/2016 FINDINGS: The heart size and mediastinal contours are within normal limits. Both lungs are clear. The visualized skeletal structures are unremarkable. IMPRESSION: No active disease. Electronically Signed   By: Jasmine Pang M.D.   On: 05/20/2020 18:55   ECHOCARDIOGRAM COMPLETE  Result Date: 05/21/2020  ECHOCARDIOGRAM REPORT   Patient Name:   XIMENA TODARO Drennen Date of Exam: 05/21/2020 Medical Rec #:  [id number]     Height:       65.0 in Accession #:    [id number]    Weight:       210.0 lb Date of Birth:  09-23-1950     BSA:          2.020 m Patient Age:    69 years      BP:           107/83 mmHg Patient Gender: F             HR:           76 bpm. Exam Location:  Inpatient Procedure: 2D Echo, Cardiac Doppler and Color Doppler Indications:    I48.0 Paroxysmal atrial fibrillation  History:        Patient has no prior history of Echocardiogram examinations.                 Risk Factors:GERD.  Sonographer:    Elmarie Shiley Dance Referring Phys: [id number] TIMOTHY S OPYD IMPRESSIONS  1. Left ventricular ejection fraction, by estimation, is 55 to 60%. The left ventricle has normal function. The left ventricle has no regional wall motion abnormalities. Left ventricular diastolic parameters are indeterminate.  2. Right ventricular systolic function is normal. The right ventricular size is normal.  3. Left atrial size was moderately dilated.  4. The mitral valve is degenerative. Mild mitral valve regurgitation. No evidence of mitral stenosis. Moderate mitral annular calcification.  5. The aortic valve is tricuspid. Aortic valve regurgitation is not visualized. No aortic stenosis is present.  6. The inferior vena cava is normal in size with greater than 50% respiratory variability, suggesting right atrial pressure of 3 mmHg. FINDINGS  Left Ventricle: Left ventricular ejection fraction, by estimation, is 55 to 60%. The left ventricle has normal function. The left ventricle has no regional wall motion abnormalities. The left ventricular internal cavity size was normal in size. There is  no left ventricular hypertrophy. Left ventricular diastolic parameters are indeterminate. Right Ventricle: The right ventricular size is normal. No increase in right ventricular wall thickness. Right ventricular systolic function is normal. Left Atrium: Left  atrial size was moderately dilated. Right Atrium: Right atrial size was normal in size. Pericardium: There is no evidence of pericardial effusion. Mitral Valve: The mitral valve is degenerative in appearance. Moderate mitral annular calcification. Mild mitral valve regurgitation. No evidence of mitral valve stenosis. Tricuspid Valve: The tricuspid valve is normal in structure. Tricuspid valve regurgitation is mild . No evidence of tricuspid stenosis. Aortic Valve: The aortic valve is tricuspid. Aortic valve regurgitation is not visualized. No aortic stenosis is present. Pulmonic Valve: The pulmonic valve was normal in structure. Pulmonic valve regurgitation is not visualized. No evidence of pulmonic stenosis. Aorta: The aortic root is normal in size and structure. Venous: The inferior vena cava is normal in size with greater than 50% respiratory variability, suggesting right atrial pressure of 3 mmHg. IAS/Shunts: No atrial level shunt detected by color flow Doppler.  LEFT VENTRICLE PLAX 2D LVIDd:         4.10 cm LVIDs:         2.80 cm LV PW:         1.00 cm LV IVS:        1.00 cm LVOT diam:     1.90 cm LV SV:  47 LV SV Index:   23 LVOT Area:     2.84 cm  RIGHT VENTRICLE          IVC RV Basal diam:  2.20 cm  IVC diam: 1.90 cm TAPSE (M-mode): 1.7 cm LEFT ATRIUM              Index       RIGHT ATRIUM           Index LA diam:        4.50 cm  2.23 cm/m  RA Area:     12.40 cm LA Vol (A2C):   111.0 ml 54.96 ml/m RA Volume:   26.40 ml  13.07 ml/m LA Vol (A4C):   84.6 ml  41.89 ml/m LA Biplane Vol: 99.0 ml  49.02 ml/m  AORTIC VALVE LVOT Vmax:   81.90 cm/s LVOT Vmean:  52.850 cm/s LVOT VTI:    0.166 m  AORTA Ao Root diam: 3.10 cm Ao Asc diam:  3.40 cm MITRAL VALVE MV Area (PHT): 4.18 cm    SHUNTS MV Decel Time: 182 msec    Systemic VTI:  0.17 m MV E velocity: 81.65 cm/s  Systemic Diam: 1.90 cm Charlton HawsPeter Nishan MD Electronically signed by Charlton HawsPeter Nishan MD Signature Date/Time: 05/21/2020/9:58:16 AM    Final       Labs: BNP (last 3 results) No results for input(s): BNP in the last 8760 hours. Basic Metabolic Panel: Recent Labs  Lab 05/20/20 1909 05/21/20 0433 05/22/20 0740  NA 143 137 140  K 5.0 3.7 3.5  CL 107 105 107  CO2 26 24 23   GLUCOSE 93 109* 160*  BUN 17 14 17   CREATININE 0.91 0.77 0.78  CALCIUM 9.8 9.3 9.3  MG  --  1.8  --    Liver Function Tests: Recent Labs  Lab 05/20/20 1909  AST 22  ALT 15  ALKPHOS 70  BILITOT 0.6  PROT 7.4  ALBUMIN 4.0   No results for input(s): LIPASE, AMYLASE in the last 168 hours. No results for input(s): AMMONIA in the last 168 hours. CBC: Recent Labs  Lab 05/20/20 1909 05/21/20 0433 05/22/20 0740  WBC 9.1 8.6 7.1  NEUTROABS 5.3  --   --   HGB 15.2* 14.5 14.7  HCT 47.9* 44.7 44.8  MCV 91.2 90.1 90.3  PLT 296 280 299   Cardiac Enzymes: No results for input(s): CKTOTAL, CKMB, CKMBINDEX, TROPONINI in the last 168 hours. BNP: Invalid input(s): POCBNP CBG: No results for input(s): GLUCAP in the last 168 hours. D-Dimer No results for input(s): DDIMER in the last 72 hours. Hgb A1c No results for input(s): HGBA1C in the last 72 hours. Lipid Profile No results for input(s): CHOL, HDL, LDLCALC, TRIG, CHOLHDL, LDLDIRECT in the last 72 hours. Thyroid function studies Recent Labs    05/21/20 0433  TSH 3.286   Anemia work up No results for input(s): VITAMINB12, FOLATE, FERRITIN, TIBC, IRON, RETICCTPCT in the last 72 hours. Urinalysis    Component Value Date/Time   COLORURINE AMBER (A) 09/03/2016 1331   APPEARANCEUR HAZY (A) 09/03/2016 1331   LABSPEC 1.029 09/03/2016 1331   PHURINE 5.0 09/03/2016 1331   GLUCOSEU NEGATIVE 09/03/2016 1331   HGBUR MODERATE (A) 09/03/2016 1331   BILIRUBINUR SMALL (A) 09/03/2016 1331   KETONESUR 5 (A) 09/03/2016 1331   PROTEINUR 30 (A) 09/03/2016 1331   UROBILINOGEN 1.0 01/31/2014 1255   NITRITE NEGATIVE 09/03/2016 1331   LEUKOCYTESUR MODERATE (A) 09/03/2016 1331   Sepsis Labs Invalid  input(s):  PROCALCITONIN,  WBC,  LACTICIDVEN Microbiology Recent Results (from the past 240 hour(s))  SARS Coronavirus 2 by RT PCR (hospital order, performed in [facility] hospital lab) Nasopharyngeal Nasopharyngeal Swab     Status: None   Collection Time: 05/21/20  2:08 AM   Specimen: Nasopharyngeal Swab  Result Value Ref Range Status   SARS Coronavirus 2 NEGATIVE NEGATIVE Final    Comment: (NOTE) SARS-CoV-2 target nucleic acids are NOT DETECTED.  The SARS-CoV-2 RNA is generally detectable in upper and lower respiratory specimens during the acute phase of infection. The lowest concentration of SARS-CoV-2 viral copies this assay can detect is 250 copies / mL. A negative result does not preclude SARS-CoV-2 infection and should not be used as the sole basis for treatment or other patient management decisions.  A negative result may occur with improper specimen collection / handling, submission of specimen other than nasopharyngeal swab, presence of viral mutation(s) within the areas targeted by this assay, and inadequate number of viral copies (<250 copies / mL). A negative result must be combined with clinical observations, patient history, and epidemiological information.  Fact Sheet for Patients:   BoilerBrush.com.cy  Fact Sheet for Healthcare Providers: https://pope.com/  This test is not yet approved or  cleared by the [place] FDA and has been authorized for detection and/or diagnosis of SARS-CoV-2 by FDA under an Emergency Use Authorization (EUA).  This EUA will remain in effect (meaning this test can be used) for the duration of the COVID-19 declaration under Section 564(b)(1) of the Act, 21 U.S.C. section 360bbb-3(b)(1), unless the authorization is terminated or revoked sooner.  Performed at [facility], [address], [place], [place] [address]      Time coordinating discharge: 25 minutes  SIGNED: Lanae Boast, MD  [facility] 05/22/2020, 5:09 PM  If 7PM-7AM, please contact night-coverage [email or web address]

## 2020-05-22 NOTE — Progress Notes (Signed)
Dr. Jens Som has requested f/u in 4-6 weeks. I have made afib clinic appt for 10/7. It was also noted that earlier this admission the EDP sent in rx for Eliquis and diltiazem when it was originally thought she would go home from ER - the diltiazem was at a different dose than she will be going home on. I called [facility] to disregard those rx's and went and and re-sent in the prescriptions with refills. (Just didn't want her to end up without refills or on wrong dose of diltiazem.) Also notified patient to avoid Aleve/NSAIDs given anticoagulation, that Tylenol would instead be safe to use PRN. She verbalized understanding. Lisa Trimmer PA-C

## 2020-05-22 NOTE — Progress Notes (Signed)
Progress Note  Patient Name: Lisa Moore Date of Encounter: 05/22/2020  [facility] Cardiologist: Olga Millers, MD   Subjective   No CP or dyspnea  Inpatient Medications    Scheduled Meds:  apixaban  5 mg Oral BID   diltiazem  180 mg Oral Daily   sodium chloride flush  3 mL Intravenous Q12H   Continuous Infusions:  sodium chloride 200 mL (05/21/20 1031)   PRN Meds: sodium chloride, acetaminophen, ondansetron (ZOFRAN) IV, sodium chloride flush   Vital Signs    Vitals:   05/21/20 2037 05/21/20 2307 05/22/20 0638 05/22/20 0640  BP: 102/74 117/84  117/88  Pulse: 80 86  89  Resp: 16   16  Temp: 99 F (37.2 C)   97.8 F (36.6 C)  TempSrc: Oral   Oral  SpO2: 98%   96%  Weight:   94.8 kg   Height:        Intake/Output Summary (Last 24 hours) at 05/22/2020 0756 Last data filed at 05/21/2020 2306 Gross per 24 hour  Intake 243 ml  Output --  Net 243 ml   Last 3 Weights 05/22/2020 05/21/2020 05/20/2020  Weight (lbs) 209 lb 210 lb 212 lb  Weight (kg) 94.802 kg 95.255 kg 96.163 kg      Telemetry    Atrial fibrillation rate controlled - Personally Reviewed  Physical Exam   GEN: No acute distress.   Neck: No JVD Cardiac: irregular Respiratory: Clear to auscultation bilaterally. GI: Soft, nontender, non-distended  MS: No edema Neuro:  Nonfocal  Psych: Normal affect   Labs    Chemistry Recent Labs  Lab 05/20/20 1909 05/21/20 0433  NA 143 137  K 5.0 3.7  CL 107 105  CO2 26 24  GLUCOSE 93 109*  BUN 17 14  CREATININE 0.91 0.77  CALCIUM 9.8 9.3  PROT 7.4  --   ALBUMIN 4.0  --   AST 22  --   ALT 15  --   ALKPHOS 70  --   BILITOT 0.6  --   GFRNONAA >60 >60  GFRAA >60 >60  ANIONGAP 10 8     Hematology Recent Labs  Lab 05/20/20 1909 05/21/20 0433  WBC 9.1 8.6  RBC 5.25* 4.96  HGB 15.2* 14.5  HCT 47.9* 44.7  MCV 91.2 90.1  MCH 29.0 29.2  MCHC 31.7 32.4  RDW 12.9 13.1  PLT 296 280    Radiology    CT Angio Head W or Wo  Contrast  Result Date: 05/21/2020 CLINICAL DATA:  Initial evaluation for acute headache. EXAM: CT ANGIOGRAPHY HEAD AND NECK TECHNIQUE: Multidetector CT imaging of the head and neck was performed using the standard protocol during bolus administration of intravenous contrast. Multiplanar CT image reconstructions and MIPs were obtained to evaluate the vascular anatomy. Carotid stenosis measurements (when applicable) are obtained utilizing NASCET criteria, using the distal internal carotid diameter as the denominator. CONTRAST:  50mL OMNIPAQUE IOHEXOL 350 MG/ML SOLN COMPARISON:  None. FINDINGS: CT HEAD FINDINGS Brain: Cerebral volume within normal limits for patient age. No evidence for acute intracranial hemorrhage. No findings to suggest acute large vessel territory infarct. No mass lesion, midline shift, or mass effect. Ventricles are normal in size without evidence for hydrocephalus. No extra-axial fluid collection identified. Vascular: No hyperdense vessel identified. Skull: Scalp soft tissues demonstrate no acute abnormality. Calvarium intact. Sinuses/Orbits: Globes and orbital soft tissues within normal limits. Visualized paranasal sinuses are clear. No mastoid effusion. CTA NECK FINDINGS Aortic arch: Visualized aortic arch  of normal caliber with normal branch pattern. No flow-limiting stenosis or other abnormality about the origin of the great vessels. Right carotid system: Right common and internal carotid arteries widely patent without stenosis, dissection or occlusion. Left carotid system: Left common and internal carotid arteries widely patent without stenosis, dissection or occlusion. Vertebral arteries: Both vertebral arteries arise from the subclavian arteries. No proximal subclavian artery stenosis. Vertebral arteries widely patent without stenosis, dissection or occlusion. Skeleton: No acute osseous abnormality. No discrete or worrisome osseous lesions. Other neck: No other acute soft tissue  abnormality within the neck. No mass lesion or adenopathy. Upper chest: Visualized upper chest demonstrates no other acute abnormality. Review of the MIP images confirms the above findings CTA HEAD FINDINGS Anterior circulation: Both internal carotid arteries widely patent to the termini without stenosis or other abnormality. A1 segments widely patent. Normal anterior communicating artery complex. Anterior cerebral arteries widely patent to their distal aspects without stenosis. Normal in stenosis or occlusion. Normal MCA bifurcations. Distal MCA branches well perfused and symmetric. Posterior circulation: Both vertebral arteries widely patent to the vertebrobasilar junction without stenosis. Both picas patent. Short fenestration involving the proximal basilar artery noted. Basilar widely patent to its distal aspect without stenosis. Superior cerebral arteries patent bilaterally. Both PCAs primarily supplied via the basilar well perfused or distal aspects. Venous sinuses: Not well assessed due to timing the contrast bolus. Anatomic variants: None significant. No intracranial aneurysm or other vascular abnormality. Review of the MIP images confirms the above findings IMPRESSION: 1. Normal CTA of the head and neck. No large vessel occlusion, hemodynamically significant stenosis, or other acute vascular abnormality. No aneurysm. 2. No other acute intracranial abnormality. Electronically Signed   By: Rise Mu M.D.   On: 05/21/2020 00:49   CT Angio Neck W and/or Wo Contrast  Result Date: 05/21/2020 CLINICAL DATA:  Initial evaluation for acute headache. EXAM: CT ANGIOGRAPHY HEAD AND NECK TECHNIQUE: Multidetector CT imaging of the head and neck was performed using the standard protocol during bolus administration of intravenous contrast. Multiplanar CT image reconstructions and MIPs were obtained to evaluate the vascular anatomy. Carotid stenosis measurements (when applicable) are obtained utilizing NASCET  criteria, using the distal internal carotid diameter as the denominator. CONTRAST:  75mL OMNIPAQUE IOHEXOL 350 MG/ML SOLN COMPARISON:  None. FINDINGS: CT HEAD FINDINGS Brain: Cerebral volume within normal limits for patient age. No evidence for acute intracranial hemorrhage. No findings to suggest acute large vessel territory infarct. No mass lesion, midline shift, or mass effect. Ventricles are normal in size without evidence for hydrocephalus. No extra-axial fluid collection identified. Vascular: No hyperdense vessel identified. Skull: Scalp soft tissues demonstrate no acute abnormality. Calvarium intact. Sinuses/Orbits: Globes and orbital soft tissues within normal limits. Visualized paranasal sinuses are clear. No mastoid effusion. CTA NECK FINDINGS Aortic arch: Visualized aortic arch of normal caliber with normal branch pattern. No flow-limiting stenosis or other abnormality about the origin of the great vessels. Right carotid system: Right common and internal carotid arteries widely patent without stenosis, dissection or occlusion. Left carotid system: Left common and internal carotid arteries widely patent without stenosis, dissection or occlusion. Vertebral arteries: Both vertebral arteries arise from the subclavian arteries. No proximal subclavian artery stenosis. Vertebral arteries widely patent without stenosis, dissection or occlusion. Skeleton: No acute osseous abnormality. No discrete or worrisome osseous lesions. Other neck: No other acute soft tissue abnormality within the neck. No mass lesion or adenopathy. Upper chest: Visualized upper chest demonstrates no other acute abnormality. Review of the MIP  images confirms the above findings CTA HEAD FINDINGS Anterior circulation: Both internal carotid arteries widely patent to the termini without stenosis or other abnormality. A1 segments widely patent. Normal anterior communicating artery complex. Anterior cerebral arteries widely patent to their distal  aspects without stenosis. Normal in stenosis or occlusion. Normal MCA bifurcations. Distal MCA branches well perfused and symmetric. Posterior circulation: Both vertebral arteries widely patent to the vertebrobasilar junction without stenosis. Both picas patent. Short fenestration involving the proximal basilar artery noted. Basilar widely patent to its distal aspect without stenosis. Superior cerebral arteries patent bilaterally. Both PCAs primarily supplied via the basilar well perfused or distal aspects. Venous sinuses: Not well assessed due to timing the contrast bolus. Anatomic variants: None significant. No intracranial aneurysm or other vascular abnormality. Review of the MIP images confirms the above findings IMPRESSION: 1. Normal CTA of the head and neck. No large vessel occlusion, hemodynamically significant stenosis, or other acute vascular abnormality. No aneurysm. 2. No other acute intracranial abnormality. Electronically Signed   By: Rise MuBenjamin  McClintock M.D.   On: 05/21/2020 00:49   MR BRAIN WO CONTRAST  Result Date: 05/21/2020 CLINICAL DATA:  Initial evaluation for acute dizziness, ataxia. EXAM: MRI HEAD WITHOUT CONTRAST TECHNIQUE: Multiplanar, multiecho pulse sequences of the brain and surrounding structures were obtained without intravenous contrast. COMPARISON:  Prior CTA from 05/20/2020. FINDINGS: Brain: Generalized age-related cerebral atrophy. No significant cerebral white matter disease for age. No abnormal foci of restricted diffusion to suggest acute or subacute ischemia. Gray-white matter differentiation maintained. No encephalomalacia to suggest chronic cortical infarction. No foci of susceptibility artifact to suggest acute or chronic intracranial hemorrhage. No mass lesion, midline shift or mass effect. No hydrocephalus or extra-axial fluid collection. Pituitary gland suprasellar region normal. Midline structures intact. Vascular: Major intracranial vascular flow voids are well  maintained. Skull and upper cervical spine: Craniocervical junction within normal limits. Bone marrow signal intensity normal. No scalp soft tissue abnormality. Sinuses/Orbits: Globes and orbital soft tissues within normal limits. Mild scattered mucosal thickening noted within the ethmoidal air cells. Paranasal sinuses are otherwise clear. No mastoid effusion. Inner ear structures grossly within normal limits. Other: None. IMPRESSION: Negative brain MRI for age. No acute intracranial abnormality identified. Electronically Signed   By: Rise MuBenjamin  McClintock M.D.   On: 05/21/2020 03:51   DG Chest Port 1 View  Result Date: 05/20/2020 CLINICAL DATA:  AFib with RVR EXAM: PORTABLE CHEST 1 VIEW COMPARISON:  09/03/2016 FINDINGS: The heart size and mediastinal contours are within normal limits. Both lungs are clear. The visualized skeletal structures are unremarkable. IMPRESSION: No active disease. Electronically Signed   By: Jasmine PangKim  Fujinaga M.D.   On: 05/20/2020 18:55   ECHOCARDIOGRAM COMPLETE  Result Date: 05/21/2020    ECHOCARDIOGRAM REPORT   Patient Name:   Lisa Moore Date of Exam: 05/21/2020 Medical Rec #:  [id number]     Height:       65.0 in Accession #:    [id number]    Weight:       210.0 lb Date of Birth:  [birth year]     BSA:          2.020 m Patient Age:    69 years      BP:           107/83 mmHg Patient Gender: F             HR:           76 bpm. Exam Location:  Inpatient Procedure: 2D Echo, Cardiac  Doppler and Color Doppler Indications:    I48.0 Paroxysmal atrial fibrillation  History:        Patient has no prior history of Echocardiogram examinations.                 Risk Factors:GERD.  Sonographer:    Elmarie Shiley Dance Referring Phys: [id number] TIMOTHY S OPYD IMPRESSIONS  1. Left ventricular ejection fraction, by estimation, is 55 to 60%. The left ventricle has normal function. The left ventricle has no regional wall motion abnormalities. Left ventricular diastolic parameters are indeterminate.  2. Right  ventricular systolic function is normal. The right ventricular size is normal.  3. Left atrial size was moderately dilated.  4. The mitral valve is degenerative. Mild mitral valve regurgitation. No evidence of mitral stenosis. Moderate mitral annular calcification.  5. The aortic valve is tricuspid. Aortic valve regurgitation is not visualized. No aortic stenosis is present.  6. The inferior vena cava is normal in size with greater than 50% respiratory variability, suggesting right atrial pressure of 3 mmHg. FINDINGS  Left Ventricle: Left ventricular ejection fraction, by estimation, is 55 to 60%. The left ventricle has normal function. The left ventricle has no regional wall motion abnormalities. The left ventricular internal cavity size was normal in size. There is  no left ventricular hypertrophy. Left ventricular diastolic parameters are indeterminate. Right Ventricle: The right ventricular size is normal. No increase in right ventricular wall thickness. Right ventricular systolic function is normal. Left Atrium: Left atrial size was moderately dilated. Right Atrium: Right atrial size was normal in size. Pericardium: There is no evidence of pericardial effusion. Mitral Valve: The mitral valve is degenerative in appearance. Moderate mitral annular calcification. Mild mitral valve regurgitation. No evidence of mitral valve stenosis. Tricuspid Valve: The tricuspid valve is normal in structure. Tricuspid valve regurgitation is mild . No evidence of tricuspid stenosis. Aortic Valve: The aortic valve is tricuspid. Aortic valve regurgitation is not visualized. No aortic stenosis is present. Pulmonic Valve: The pulmonic valve was normal in structure. Pulmonic valve regurgitation is not visualized. No evidence of pulmonic stenosis. Aorta: The aortic root is normal in size and structure. Venous: The inferior vena cava is normal in size with greater than 50% respiratory variability, suggesting right atrial pressure of 3  mmHg. IAS/Shunts: No atrial level shunt detected by color flow Doppler.  LEFT VENTRICLE PLAX 2D LVIDd:         4.10 cm LVIDs:         2.80 cm LV PW:         1.00 cm LV IVS:        1.00 cm LVOT diam:     1.90 cm LV SV:         47 LV SV Index:   23 LVOT Area:     2.84 cm  RIGHT VENTRICLE          IVC RV Basal diam:  2.20 cm  IVC diam: 1.90 cm TAPSE (M-mode): 1.7 cm LEFT ATRIUM              Index       RIGHT ATRIUM           Index LA diam:        4.50 cm  2.23 cm/m  RA Area:     12.40 cm LA Vol (A2C):   111.0 ml 54.96 ml/m RA Volume:   26.40 ml  13.07 ml/m LA Vol (A4C):   84.6 ml  41.89 ml/m LA Biplane Vol:  99.0 ml  49.02 ml/m  AORTIC VALVE LVOT Vmax:   81.90 cm/s LVOT Vmean:  52.850 cm/s LVOT VTI:    0.166 m  AORTA Ao Root diam: 3.10 cm Ao Asc diam:  3.40 cm MITRAL VALVE MV Area (PHT): 4.18 cm    SHUNTS MV Decel Time: 182 msec    Systemic VTI:  0.17 m MV E velocity: 81.65 cm/s  Systemic Diam: 1.90 cm Charlton Haws MD Electronically signed by Charlton Haws MD Signature Date/Time: 05/21/2020/9:58:16 AM    Final     Patient Profile     69 y.o. female admitted with newly diagnosed atrial fibrillation.  Echocardiogram shows normal LV function, moderate left atrial enlargement and mild mitral regurgitation.  Assessment & Plan    1 atrial fibrillation-patient's heart rate is controlled today and she is asymptomatic.  Continue Cardizem CD 180 mg daily at discharge.  Continue apixaban 5 mg twice daily.  We will arrange follow-up with me in 4 to 6 weeks.  At that time we will discuss whether to proceed with one cardioversion to see if she will hold sinus rhythm.  However rate control and anticoagulation may be best option as duration of atrial fibrillation is unknown and she is asymptomatic.  Patient can be discharged on present medications.  Cardiology will sign off.  We will arrange follow-up in 4 to 6 weeks.  Please call with questions.  For questions or updates, please contact [facility] Please consult  [email or web address] for contact info under        Signed, Olga Millers, MD  05/22/2020, 7:56 AM

## 2020-05-22 NOTE — Care Management (Signed)
1153 05-22-20 Case Manager spoke with patient regarding her deductible for Eliquis. 30 day free card provided to patient to take to her pharmacy. Patient will transition home today. Graves-Bigelow, Lamar Laundry, RN, BSN Case Manager

## 2020-06-18 NOTE — H&P (View-Only) (Signed)
Primary Care Physician: Orland Mustard, MD Primary Cardiologist: Dr Jens Som  Primary Electrophysiologist: none Referring Physician: Kriste Basque Moore/Dr Dillard Cannon Lisa is a 69 y.o. female with a history of persistent atrial fibrillation who presents for consultation in the [facility].  The patient was initially diagnosed with atrial fibrillation 05/20/20 after presenting to her PCP office with migraine symptoms. She was found incidentally to be in afib with RVR and was sent to the ED. She was admitted and started on Eliquis for a CHADS2VASC score of 2 and diltiazem for rate control. She was asymptomatic with her arrhythmia. Echo done during her admission showed normal LVEF, moderate LA dilation, degenerative MV with mild MR. She reports she may have more fatigue since her afib was diagnosed but she is not sure about this. She denies signficant alcohol use but she does admit to snoring and daytime somnolence.   Today, she denies symptoms of palpitations, chest pain, shortness of breath, orthopnea, PND, lower extremity edema, dizziness, presyncope, syncope, bleeding, or neurologic sequela. The patient is tolerating medications without difficulties and is otherwise without complaint today.    Atrial Fibrillation Risk Factors:  she does have symptoms or diagnosis of sleep apnea. she does not have a history of rheumatic fever. she does not have a history of alcohol use. The patient does not have a history of early familial atrial fibrillation or other arrhythmias.  she has a BMI of Body mass index is 35.91 kg/m.Marland Kitchen Filed Weights   06/19/20 1002  Weight: 97.9 kg    Family History  Problem Relation Age of Onset  . Hypertension Mother   . Cancer Father   . COPD Father   . Diabetes Father   . Cancer Sister   . Kidney disease Maternal Grandmother   . Cancer Paternal Grandmother      Atrial Fibrillation Management history:  Previous antiarrhythmic drugs:  none Previous cardioversions: none Previous ablations: none CHADS2VASC score: 2 Anticoagulation history: Eliquis   Past Medical History:  Diagnosis Date  . Arthritis   . Benign hematuria   . GERD (gastroesophageal reflux disease)   . Migraines    rare  . New onset atrial fibrillation (HCC) 05/21/2020   Past Surgical History:  Procedure Laterality Date  . ABDOMINAL HYSTERECTOMY    . APPENDECTOMY      Current Outpatient Medications  Medication Sig Dispense Refill  . acetaminophen (TYLENOL) 325 MG tablet Take 650 mg by mouth every 6 (six) hours as needed for mild pain or moderate pain.    Marland Kitchen apixaban (ELIQUIS) 5 MG TABS tablet Take 1 tablet (5 mg total) by mouth 2 (two) times daily. 60 tablet 6  . diltiazem (CARDIZEM CD) 180 MG 24 hr capsule Take 1 capsule (180 mg total) by mouth daily. 30 capsule 6   No current facility-administered medications for this encounter.    No Known Allergies  Social History   Socioeconomic History  . Marital status: Widowed    Spouse name: Not on file  . Number of children: Not on file  . Years of education: Not on file  . Highest education level: Not on file  Occupational History  . Not on file  Tobacco Use  . Smoking status: Never Smoker  . Smokeless tobacco: Never Used  Vaping Use  . Vaping Use: Never used  Substance and Sexual Activity  . Alcohol use: Yes    Alcohol/week: 1.0 standard drink    Types: 1 Shots of liquor per  week    Comment: occasional beer few times a month, not regularly  . Drug use: No  . Sexual activity: Not Currently  Other Topics Concern  . Not on file  Social History Narrative  . Not on file   Social Determinants of Health   Financial Resource Strain:   . Difficulty of Paying Living Expenses: Not on file  Food Insecurity:   . Worried About Programme researcher, broadcasting/film/video in the Last Year: Not on file  . Ran Out of Food in the Last Year: Not on file  Transportation Needs:   . Lack of Transportation (Medical): Not  on file  . Lack of Transportation (Non-Medical): Not on file  Physical Activity:   . Days of Exercise per Week: Not on file  . Minutes of Exercise per Session: Not on file  Stress:   . Feeling of Stress : Not on file  Social Connections:   . Frequency of Communication with Friends and Family: Not on file  . Frequency of Social Gatherings with Friends and Family: Not on file  . Attends Religious Services: Not on file  . Active Member of Clubs or Organizations: Not on file  . Attends Banker Meetings: Not on file  . Marital Status: Not on file  Intimate Partner Violence:   . Fear of Current or Ex-Partner: Not on file  . Emotionally Abused: Not on file  . Physically Abused: Not on file  . Sexually Abused: Not on file     ROS- All systems are reviewed and negative except as per the HPI above.  Physical Exam: Vitals:   06/19/20 1002  BP: 122/88  Pulse: 82  Weight: 97.9 kg  Height: 5\' 5"  (1.651 m)    GEN- The patient is well appearing obese female, alert and oriented x 3 today.   Head- normocephalic, atraumatic Eyes-  Sclera clear, conjunctiva pink Ears- hearing intact Oropharynx- clear Neck- supple  Lungs- Clear to ausculation bilaterally, normal work of breathing Heart- irregular rate and rhythm, no murmurs, rubs or gallops  GI- soft, NT, ND, + BS Extremities- no clubbing, cyanosis, or edema MS- no significant deformity or atrophy Skin- no rash or lesion Psych- euthymic mood, full affect Neuro- strength and sensation are intact  Wt Readings from Last 3 Encounters:  06/19/20 97.9 kg  05/22/20 94.8 kg  05/20/20 96.3 kg    EKG today demonstrates afib HR 82, QRS 76, QTc 432  Echo 05/21/20 demonstrated  1. Left ventricular ejection fraction, by estimation, is 55 to 60%. The  left ventricle has normal function. The left ventricle has no regional  wall motion abnormalities. Left ventricular diastolic parameters are  indeterminate.  2. Right ventricular  systolic function is normal. The right ventricular  size is normal.  3. Left atrial size was moderately dilated.  4. The mitral valve is degenerative. Mild mitral valve regurgitation. No  evidence of mitral stenosis. Moderate mitral annular calcification.  5. The aortic valve is tricuspid. Aortic valve regurgitation is not  visualized. No aortic stenosis is present.  6. The inferior vena cava is normal in size with greater than 50%  respiratory variability, suggesting right atrial pressure of 3 mmHg.   Epic records are reviewed at length today  CHA2DS2-VASc Score = 2  The patient's score is based upon: CHF History: 0 HTN History: 0 Age : 1 Diabetes History: 0 Stroke History: 0 Vascular Disease History: 0 Gender: 1      ASSESSMENT AND PLAN: 1. Persistent Atrial  Fibrillation (ICD10:  I48.19) The patient's CHA2DS2-VASc score is 2, indicating a 2.2% annual risk of stroke.   General education about afib provided and questions answered. We also discussed her stroke risk and the risks and benefits of anticoagulation. We discussed therapeutic options today including DCCV vs rate control. She would like to take time to consider DCCV as she is unsure if she is symptomatic. We did discuss that the longer someone is in afib, the more difficult it may be to restore SR. Patient voices understanding. Check bmet/CBC today. Continue Eliquis 5 mg BID Continue diltiazem 180 mg daily  2. Obesity Body mass index is 35.91 kg/m. Lifestyle modification was discussed at length including regular exercise and weight reduction.  3. Snoring/daytime somnolence The importance of adequate treatment of sleep apnea was discussed today in order to improve our ability to maintain sinus rhythm long term. Will refer for sleep study.   Follow up in the AF clinic in one month.    Jorja Loa PA-C [facility] [address] [place], [place] [address] [phone] 06/19/2020 11:04  AM

## 2020-06-18 NOTE — Progress Notes (Signed)
Primary Care Physician: Orland Mustard, MD Primary Cardiologist: Dr Jens Som  Primary Electrophysiologist: none Referring Physician: Kriste Basque Dunn/Dr Dillard Cannon Lisa Moore is a 69 y.o. female with a history of persistent atrial fibrillation who presents for consultation in the [facility].  The patient was initially diagnosed with atrial fibrillation 05/20/20 after presenting to her PCP office with migraine symptoms. She was found incidentally to be in afib with RVR and was sent to the ED. She was admitted and started on Eliquis for a CHADS2VASC score of 2 and diltiazem for rate control. She was asymptomatic with her arrhythmia. Echo done during her admission showed normal LVEF, moderate LA dilation, degenerative MV with mild MR. She reports she may have more fatigue since her afib was diagnosed but she is not sure about this. She denies signficant alcohol use but she does admit to snoring and daytime somnolence.   Today, she denies symptoms of palpitations, chest pain, shortness of breath, orthopnea, PND, lower extremity edema, dizziness, presyncope, syncope, bleeding, or neurologic sequela. The patient is tolerating medications without difficulties and is otherwise without complaint today.    Atrial Fibrillation Risk Factors:  she does have symptoms or diagnosis of sleep apnea. she does not have a history of rheumatic fever. she does not have a history of alcohol use. The patient does not have a history of early familial atrial fibrillation or other arrhythmias.  she has a BMI of Body mass index is 35.91 kg/m.Marland Kitchen Filed Weights   06/19/20 1002  Weight: 97.9 kg    Family History  Problem Relation Age of Onset  . Hypertension Mother   . Cancer Father   . COPD Father   . Diabetes Father   . Cancer Sister   . Kidney disease Maternal Grandmother   . Cancer Paternal Grandmother      Atrial Fibrillation Management history:  Previous antiarrhythmic drugs:  none Previous cardioversions: none Previous ablations: none CHADS2VASC score: 2 Anticoagulation history: Eliquis   Past Medical History:  Diagnosis Date  . Arthritis   . Benign hematuria   . GERD (gastroesophageal reflux disease)   . Migraines    rare  . New onset atrial fibrillation (HCC) 05/21/2020   Past Surgical History:  Procedure Laterality Date  . ABDOMINAL HYSTERECTOMY    . APPENDECTOMY      Current Outpatient Medications  Medication Sig Dispense Refill  . acetaminophen (TYLENOL) 325 MG tablet Take 650 mg by mouth every 6 (six) hours as needed for mild pain or moderate pain.    Marland Kitchen apixaban (ELIQUIS) 5 MG TABS tablet Take 1 tablet (5 mg total) by mouth 2 (two) times daily. 60 tablet 6  . diltiazem (CARDIZEM CD) 180 MG 24 hr capsule Take 1 capsule (180 mg total) by mouth daily. 30 capsule 6   No current facility-administered medications for this encounter.    No Known Allergies  Social History   Socioeconomic History  . Marital status: Widowed    Spouse name: Not on file  . Number of children: Not on file  . Years of education: Not on file  . Highest education level: Not on file  Occupational History  . Not on file  Tobacco Use  . Smoking status: Never Smoker  . Smokeless tobacco: Never Used  Vaping Use  . Vaping Use: Never used  Substance and Sexual Activity  . Alcohol use: Yes    Alcohol/week: 1.0 standard drink    Types: 1 Shots of liquor per  week    Comment: occasional beer few times a month, not regularly  . Drug use: No  . Sexual activity: Not Currently  Other Topics Concern  . Not on file  Social History Narrative  . Not on file   Social Determinants of Health   Financial Resource Strain:   . Difficulty of Paying Living Expenses: Not on file  Food Insecurity:   . Worried About Programme researcher, broadcasting/film/video in the Last Year: Not on file  . Ran Out of Food in the Last Year: Not on file  Transportation Needs:   . Lack of Transportation (Medical): Not  on file  . Lack of Transportation (Non-Medical): Not on file  Physical Activity:   . Days of Exercise per Week: Not on file  . Minutes of Exercise per Session: Not on file  Stress:   . Feeling of Stress : Not on file  Social Connections:   . Frequency of Communication with Friends and Family: Not on file  . Frequency of Social Gatherings with Friends and Family: Not on file  . Attends Religious Services: Not on file  . Active Member of Clubs or Organizations: Not on file  . Attends Banker Meetings: Not on file  . Marital Status: Not on file  Intimate Partner Violence:   . Fear of Current or Ex-Partner: Not on file  . Emotionally Abused: Not on file  . Physically Abused: Not on file  . Sexually Abused: Not on file     ROS- All systems are reviewed and negative except as per the HPI above.  Physical Exam: Vitals:   06/19/20 1002  BP: 122/88  Pulse: 82  Weight: 97.9 kg  Height: 5\' 5"  (1.651 m)    GEN- The patient is well appearing obese female, alert and oriented x 3 today.   Head- normocephalic, atraumatic Eyes-  Sclera clear, conjunctiva pink Ears- hearing intact Oropharynx- clear Neck- supple  Lungs- Clear to ausculation bilaterally, normal work of breathing Heart- irregular rate and rhythm, no murmurs, rubs or gallops  GI- soft, NT, ND, + BS Extremities- no clubbing, cyanosis, or edema MS- no significant deformity or atrophy Skin- no rash or lesion Psych- euthymic mood, full affect Neuro- strength and sensation are intact  Wt Readings from Last 3 Encounters:  06/19/20 97.9 kg  05/22/20 94.8 kg  05/20/20 96.3 kg    EKG today demonstrates afib HR 82, QRS 76, QTc 432  Echo 05/21/20 demonstrated  1. Left ventricular ejection fraction, by estimation, is 55 to 60%. The  left ventricle has normal function. The left ventricle has no regional  wall motion abnormalities. Left ventricular diastolic parameters are  indeterminate.  2. Right ventricular  systolic function is normal. The right ventricular  size is normal.  3. Left atrial size was moderately dilated.  4. The mitral valve is degenerative. Mild mitral valve regurgitation. No  evidence of mitral stenosis. Moderate mitral annular calcification.  5. The aortic valve is tricuspid. Aortic valve regurgitation is not  visualized. No aortic stenosis is present.  6. The inferior vena cava is normal in size with greater than 50%  respiratory variability, suggesting right atrial pressure of 3 mmHg.   Epic records are reviewed at length today  CHA2DS2-VASc Score = 2  The patient's score is based upon: CHF History: 0 HTN History: 0 Age : 1 Diabetes History: 0 Stroke History: 0 Vascular Disease History: 0 Gender: 1      ASSESSMENT AND PLAN: 1. Persistent Atrial  Fibrillation (ICD10:  I48.19) The patient's CHA2DS2-VASc score is 2, indicating a 2.2% annual risk of stroke.   General education about afib provided and questions answered. We also discussed her stroke risk and the risks and benefits of anticoagulation. We discussed therapeutic options today including DCCV vs rate control. She would like to take time to consider DCCV as she is unsure if she is symptomatic. We did discuss that the longer someone is in afib, the more difficult it may be to restore SR. Patient voices understanding. Check bmet/CBC today. Continue Eliquis 5 mg BID Continue diltiazem 180 mg daily  2. Obesity Body mass index is 35.91 kg/m. Lifestyle modification was discussed at length including regular exercise and weight reduction.  3. Snoring/daytime somnolence The importance of adequate treatment of sleep apnea was discussed today in order to improve our ability to maintain sinus rhythm long term. Will refer for sleep study.   Follow up in the AF clinic in one month.    Jorja Loa PA-C [facility] [address] [place], [place] [address] [phone] 06/19/2020 11:04  AM

## 2020-06-19 ENCOUNTER — Encounter (HOSPITAL_COMMUNITY): Payer: Self-pay | Admitting: Physician Assistant

## 2020-06-19 ENCOUNTER — Other Ambulatory Visit: Payer: Self-pay

## 2020-06-19 ENCOUNTER — Ambulatory Visit (HOSPITAL_COMMUNITY)
Admit: 2020-06-19 | Discharge: 2020-06-19 | Disposition: A | Discharge status: Home / Self Care | Payer: Medicare Other | Source: Ambulatory Visit | Attending: Physician Assistant | Admitting: Physician Assistant

## 2020-06-19 ENCOUNTER — Telehealth: Payer: Self-pay | Admitting: *Deleted

## 2020-06-19 VITALS — BP 122/88 | HR 82 | Ht 65.0 in | Wt 215.8 lb

## 2020-06-19 DIAGNOSIS — Z23 Encounter for immunization: Secondary | ICD-10-CM | POA: Diagnosis not present

## 2020-06-19 DIAGNOSIS — Z79899 Other long term (current) drug therapy: Secondary | ICD-10-CM | POA: Diagnosis not present

## 2020-06-19 DIAGNOSIS — E669 Obesity, unspecified: Secondary | ICD-10-CM | POA: Insufficient documentation

## 2020-06-19 DIAGNOSIS — Z6835 Body mass index (BMI) 35.0-35.9, adult: Secondary | ICD-10-CM | POA: Diagnosis not present

## 2020-06-19 DIAGNOSIS — Z8249 Family history of ischemic heart disease and other diseases of the circulatory system: Secondary | ICD-10-CM | POA: Insufficient documentation

## 2020-06-19 DIAGNOSIS — I4819 Other persistent atrial fibrillation: Secondary | ICD-10-CM | POA: Diagnosis not present

## 2020-06-19 DIAGNOSIS — Z7901 Long term (current) use of anticoagulants: Secondary | ICD-10-CM | POA: Insufficient documentation

## 2020-06-19 DIAGNOSIS — R400 Somnolence: Secondary | ICD-10-CM | POA: Insufficient documentation

## 2020-06-19 DIAGNOSIS — R0683 Snoring: Secondary | ICD-10-CM | POA: Diagnosis not present

## 2020-06-19 LAB — BASIC METABOLIC PANEL
Anion gap: 10 (ref 5–15)
BUN: 18 mg/dL (ref 8–23)
CO2: 20 mmol/L — ABNORMAL LOW (ref 22–32)
Calcium: 9.1 mg/dL (ref 8.9–10.3)
Chloride: 109 mmol/L (ref 98–111)
Creatinine, Ser: 0.84 mg/dL (ref 0.44–1.00)
GFR calc non Af Amer: >60 mL/min (ref >60)
Glucose, Bld: 104 mg/dL — ABNORMAL HIGH (ref 70–99)
Potassium: 3.9 mmol/L (ref 3.5–5.1)
Sodium: 139 mmol/L (ref 135–145)

## 2020-06-19 LAB — CBC
HCT: 45.1 % (ref 36.0–46.0)
Hemoglobin: 14.3 g/dL (ref 12.0–15.0)
MCH: 29.1 pg (ref 26.0–34.0)
MCHC: 31.7 g/dL (ref 30.0–36.0)
MCV: 91.9 fL (ref 80.0–100.0)
Platelets: 286 10*3/uL (ref 150–400)
RBC: 4.91 MIL/uL (ref 3.87–5.11)
RDW: 13.3 % (ref 11.5–15.5)
WBC: 6.3 10*3/uL (ref 4.0–10.5)
nRBC: 0 % (ref 0.0–0.2)

## 2020-06-19 NOTE — Telephone Encounter (Signed)
Staff message sent to Nina ok to schedule sleep study. Patient has Medicare. No PA is required. 

## 2020-06-23 ENCOUNTER — Encounter: Payer: Self-pay | Admitting: Family Medicine

## 2020-06-25 ENCOUNTER — Telehealth: Payer: Self-pay | Admitting: *Deleted

## 2020-06-25 NOTE — Telephone Encounter (Signed)
Patient is scheduled for lab study on 07/31/20. Patient understands her sleep study will be done at [facility] sleep lab. Patient understands she will receive a sleep packet in a week or so. Patient understands to call if she does not receive the sleep packet in a timely manner. Patient agrees with treatment and thanked me for call.  

## 2020-06-25 NOTE — Telephone Encounter (Signed)
-----   Message from Gaynelle Cage, CMA sent at 06/19/2020  3:05 PM EDT ----- Regarding: RE: sleep study Ok to schedule sleep study. Patient has Medicare. No PA is required. ----- Message ----- From: Shona Simpson, RN Sent: 06/19/2020  10:44 AM EDT To: [facility] Sleep Studies Subject: sleep study                                    Pt needs sleep study for snoring, daytime somnolence and afib per clint fenton Thanks Radio producer [facility]

## 2020-06-27 ENCOUNTER — Other Ambulatory Visit (HOSPITAL_COMMUNITY): Payer: Self-pay | Admitting: *Deleted

## 2020-07-04 ENCOUNTER — Other Ambulatory Visit (HOSPITAL_COMMUNITY)
Admission: RE | Admit: 2020-07-04 | Discharge: 2020-07-04 | Disposition: A | Discharge status: Home / Self Care | Payer: Medicare Other | Source: Ambulatory Visit | Attending: Cardiovascular Disease | Admitting: Cardiovascular Disease

## 2020-07-04 DIAGNOSIS — Z20822 Contact with and (suspected) exposure to covid-19: Secondary | ICD-10-CM | POA: Insufficient documentation

## 2020-07-04 DIAGNOSIS — Z01812 Encounter for preprocedural laboratory examination: Secondary | ICD-10-CM | POA: Diagnosis not present

## 2020-07-04 LAB — SARS CORONAVIRUS 2 (TAT 6-24 HRS): SARS Coronavirus 2: NEGATIVE

## 2020-07-07 ENCOUNTER — Ambulatory Visit (HOSPITAL_COMMUNITY): Payer: Medicare Other | Admitting: Anesthesiology

## 2020-07-07 ENCOUNTER — Encounter (HOSPITAL_COMMUNITY)
Admission: RE | Disposition: A | Discharge status: Home / Self Care | Payer: Self-pay | Source: Ambulatory Visit | Attending: Cardiovascular Disease

## 2020-07-07 ENCOUNTER — Ambulatory Visit (HOSPITAL_COMMUNITY)
Admission: RE | Admit: 2020-07-07 | Discharge: 2020-07-07 | Disposition: A | Discharge status: Home / Self Care | Payer: Medicare Other | Source: Ambulatory Visit | Attending: Cardiovascular Disease | Admitting: Cardiovascular Disease

## 2020-07-07 ENCOUNTER — Encounter (HOSPITAL_COMMUNITY): Payer: Self-pay | Admitting: Cardiovascular Disease

## 2020-07-07 DIAGNOSIS — Z79899 Other long term (current) drug therapy: Secondary | ICD-10-CM | POA: Diagnosis not present

## 2020-07-07 DIAGNOSIS — G4730 Sleep apnea, unspecified: Secondary | ICD-10-CM | POA: Diagnosis not present

## 2020-07-07 DIAGNOSIS — Z7901 Long term (current) use of anticoagulants: Secondary | ICD-10-CM | POA: Diagnosis not present

## 2020-07-07 DIAGNOSIS — K219 Gastro-esophageal reflux disease without esophagitis: Secondary | ICD-10-CM | POA: Diagnosis not present

## 2020-07-07 DIAGNOSIS — Z6835 Body mass index (BMI) 35.0-35.9, adult: Secondary | ICD-10-CM | POA: Insufficient documentation

## 2020-07-07 DIAGNOSIS — E669 Obesity, unspecified: Secondary | ICD-10-CM | POA: Insufficient documentation

## 2020-07-07 DIAGNOSIS — I4819 Other persistent atrial fibrillation: Secondary | ICD-10-CM | POA: Diagnosis not present

## 2020-07-07 DIAGNOSIS — M1990 Unspecified osteoarthritis, unspecified site: Secondary | ICD-10-CM | POA: Diagnosis not present

## 2020-07-07 DIAGNOSIS — E785 Hyperlipidemia, unspecified: Secondary | ICD-10-CM | POA: Diagnosis not present

## 2020-07-07 DIAGNOSIS — I4891 Unspecified atrial fibrillation: Secondary | ICD-10-CM | POA: Diagnosis not present

## 2020-07-07 HISTORY — PX: CARDIOVERSION: SHX1299

## 2020-07-07 SURGERY — CARDIOVERSION
Anesthesia: General

## 2020-07-07 MED ORDER — SODIUM CHLORIDE 0.9 % IV SOLN: INTRAVENOUS | Status: DC

## 2020-07-07 MED ORDER — PROPOFOL 10 MG/ML IV BOLUS
INTRAVENOUS | Status: DC | PRN
  Administered 2020-07-07: 30 mg via INTRAVENOUS
  Administered 2020-07-07: 50 mg via INTRAVENOUS

## 2020-07-07 NOTE — Anesthesia Procedure Notes (Signed)
Procedure Name: General with mask airway Date/Time: 07/07/2020 9:12 AM Performed by: Colon Flattery, CRNA Pre-anesthesia Checklist: Patient identified, Emergency Drugs available, Suction available, Patient being monitored and Timeout performed Patient Re-evaluated:Patient Re-evaluated prior to induction Oxygen Delivery Method: Ambu bag Preoxygenation: Pre-oxygenation with 100% oxygen Induction Type: IV induction Ventilation: Mask ventilation without difficulty Placement Confirmation: positive ETCO2 Dental Injury: Teeth and Oropharynx as per pre-operative assessment

## 2020-07-07 NOTE — Interval H&P Note (Signed)
History and Physical Interval Note:  07/07/2020 8:07 AM  Lisa Moore  has presented today for surgery, with the diagnosis of AFIB.  The various methods of treatment have been discussed with the patient and family. After consideration of risks, benefits and other options for treatment, the patient has consented to  Procedure(s): CARDIOVERSION (N/A) as a surgical intervention.  The patient's history has been reviewed, patient examined, no change in status, stable for surgery.  I have reviewed the patient's chart and labs.  Questions were answered to the patient's satisfaction.     Charlton Haws

## 2020-07-07 NOTE — CV Procedure (Signed)
DCC: Anesthesia:  Propofol Dr Maple [place]  [facility] x 2 150 J then 200J biphasic  Converted to NSR with PACls  On Rx xarelto with no missed doses  No immediate neurologic sequelae  Charlton Haws MD [facility]

## 2020-07-07 NOTE — Anesthesia Preprocedure Evaluation (Signed)
Anesthesia Evaluation  Patient identified by MRN, date of birth, ID band Patient awake    Reviewed: Allergy & Precautions, NPO status , Patient's Chart, lab work & pertinent test results  History of Anesthesia Complications Negative for: history of anesthetic complications  Airway Mallampati: II  TM Distance: >3 FB Neck ROM: Full    Dental  (+) Dental Advisory Given, Teeth Intact   Pulmonary neg shortness of breath, neg sleep apnea, neg COPD, neg recent URI,  Covid-19 Nucleic Acid Test Results Lab Results      Component                Value               Date                      SARSCOV2NAA              NEGATIVE            07/04/2020                SARSCOV2NAA              NEGATIVE            05/21/2020              breath sounds clear to auscultation       Cardiovascular + dysrhythmias Atrial Fibrillation  Rhythm:Irregular     Neuro/Psych  Headaches, negative psych ROS   GI/Hepatic Neg liver ROS, GERD  ,  Endo/Other  negative endocrine ROS  Renal/GU negative Renal ROS     Musculoskeletal  (+) Arthritis ,   Abdominal   Peds  Hematology eliquis for afib   Anesthesia Other Findings   Reproductive/Obstetrics                             Anesthesia Physical Anesthesia Plan  ASA: II  Anesthesia Plan: General   Post-op Pain Management:    Induction: Intravenous  PONV Risk Score and Plan: 3 and Treatment may vary due to age or medical condition  Airway Management Planned: Nasal Cannula  Additional Equipment: None  Intra-op Plan:   Post-operative Plan:   Informed Consent: I have reviewed the patients History and Physical, chart, labs and discussed the procedure including the risks, benefits and alternatives for the proposed anesthesia with the patient or authorized representative who has indicated his/her understanding and acceptance.     Dental advisory given  Plan Discussed  with: CRNA and Surgeon  Anesthesia Plan Comments:         Anesthesia Quick Evaluation

## 2020-07-07 NOTE — Transfer of Care (Signed)
Immediate Anesthesia Transfer of Care Note  Patient: Lisa Moore  Procedure(s) Performed: CARDIOVERSION (N/A )  Patient Location: Endoscopy Unit  Anesthesia Type:General  Level of Consciousness: awake, alert  and oriented  Airway & Oxygen Therapy: Patient Spontanous Breathing  Post-op Assessment: Report given to RN and Post -op Vital signs reviewed and stable  Post vital signs: Reviewed and stable  Last Vitals:  Vitals Value Taken Time  BP    Temp    Pulse    Resp    SpO2      Last Pain:  Vitals:   07/07/20 0822  TempSrc: Oral  PainSc: 4       Patients Stated Pain Goal: 3 (07/07/20 9244)  Complications: No complications documented.

## 2020-07-07 NOTE — Discharge Instructions (Signed)
Electrical Cardioversion Electrical cardioversion is the delivery of a jolt of electricity to restore a normal rhythm to the heart. A rhythm that is too fast or is not regular keeps the heart from pumping well. In this procedure, sticky patches or metal paddles are placed on the chest to deliver electricity to the heart from a device. This procedure may be done in an emergency if:  There is low or no blood pressure as a result of the heart rhythm.  Normal rhythm must be restored as fast as possible to protect the brain and heart from further damage.  It may save a life. This may also be a scheduled procedure for irregular or fast heart rhythms that are not immediately life-threatening. Tell a health care provider about:  Any allergies you have.  All medicines you are taking, including vitamins, herbs, eye drops, creams, and over-the-counter medicines.  Any problems you or family members have had with anesthetic medicines.  Any blood disorders you have.  Any surgeries you have had.  Any medical conditions you have.  Whether you are pregnant or may be pregnant. What are the risks? Generally, this is a safe procedure. However, problems may occur, including:  Allergic reactions to medicines.  A blood clot that breaks free and travels to other parts of your body.  The possible return of an abnormal heart rhythm within hours or days after the procedure.  Your heart stopping (cardiac arrest). This is rare. What happens before the procedure? Medicines  Your health care provider may have you start taking: ? Blood-thinning medicines (anticoagulants) so your blood does not clot as easily. ? Medicines to help stabilize your heart rate and rhythm.  Ask your health care provider about: ? Changing or stopping your regular medicines. This is especially important if you are taking diabetes medicines or blood thinners. ? Taking medicines such as aspirin and ibuprofen. These medicines can  thin your blood. Do not take these medicines unless your health care provider tells you to take them. ? Taking over-the-counter medicines, vitamins, herbs, and supplements. General instructions  Follow instructions from your health care provider about eating or drinking restrictions.  Plan to have someone take you home from the hospital or clinic.  If you will be going home right after the procedure, plan to have someone with you for 24 hours.  Ask your health care provider what steps will be taken to help prevent infection. These may include washing your skin with a germ-killing soap. What happens during the procedure?   An IV will be inserted into one of your veins.  Sticky patches (electrodes) or metal paddles may be placed on your chest.  You will be given a medicine to help you relax (sedative).  An electrical shock will be delivered. The procedure may vary among health care providers and hospitals. What can I expect after the procedure?  Your blood pressure, heart rate, breathing rate, and blood oxygen level will be monitored until you leave the hospital or clinic.  Your heart rhythm will be watched to make sure it does not change.  You may have some redness on the skin where the shocks were given. Follow these instructions at home:  Do not drive for 24 hours if you were given a sedative during your procedure.  Take over-the-counter and prescription medicines only as told by your health care provider.  Ask your health care provider how to check your pulse. Check it often.  Rest for 48 hours after the procedure or   as told by your health care provider.  Avoid or limit your caffeine use as told by your health care provider.  Keep all follow-up visits as told by your health care provider. This is important. Contact a health care provider if:  You feel like your heart is beating too quickly or your pulse is not regular.  You have a serious muscle cramp that does not go  away. Get help right away if:  You have discomfort in your chest.  You are dizzy or you feel faint.  You have trouble breathing or you are short of breath.  Your speech is slurred.  You have trouble moving an arm or leg on one side of your body.  Your fingers or toes turn cold or blue. Summary  Electrical cardioversion is the delivery of a jolt of electricity to restore a normal rhythm to the heart.  This procedure may be done right away in an emergency or may be a scheduled procedure if the condition is not an emergency.  Generally, this is a safe procedure.  After the procedure, check your pulse often as told by your health care provider. This information is not intended to replace advice given to you by your health care provider. Make sure you discuss any questions you have with your health care provider. Document Revised: 04/02/2019 Document Reviewed: 04/02/2019 Elsevier Patient Education  2020 Elsevier Inc.  

## 2020-07-08 ENCOUNTER — Encounter (HOSPITAL_COMMUNITY): Payer: Self-pay | Admitting: Cardiovascular Disease

## 2020-07-08 ENCOUNTER — Ambulatory Visit (HOSPITAL_COMMUNITY): Payer: Medicare Other | Admitting: Physician Assistant

## 2020-07-08 NOTE — Anesthesia Postprocedure Evaluation (Signed)
Anesthesia Post Note  Patient: Lisa Moore  Procedure(s) Performed: CARDIOVERSION (N/A )     Patient location during evaluation: Endoscopy Anesthesia Type: General Level of consciousness: awake and alert Pain management: pain level controlled Vital Signs Assessment: post-procedure vital signs reviewed and stable Respiratory status: spontaneous breathing, nonlabored ventilation, respiratory function stable and patient connected to nasal cannula oxygen Cardiovascular status: stable Postop Assessment: no apparent nausea or vomiting Anesthetic complications: no   No complications documented.  Last Vitals:  Vitals:   07/07/20 0928 07/07/20 0938  BP: 100/78 109/72  Pulse: (!) 56 (!) 55  Resp: (!) 25 20  Temp:    SpO2: 95% 97%    Last Pain:  Vitals:   07/07/20 0938  TempSrc:   PainSc: 0-No pain                 Kiera Hussey

## 2020-07-15 ENCOUNTER — Telehealth: Payer: Self-pay | Admitting: Family Medicine

## 2020-07-15 NOTE — Telephone Encounter (Signed)
Left message for patient to call back and schedule Medicare Annual Wellness Visit (AWV) either virtually/audio only OR in office. Whatever the patients preference is.  Last AWV 03/31/18; please schedule at anytime with [facility]Nurse Health Advisor at [facility].  This should be a 45 minute visit.

## 2020-07-21 ENCOUNTER — Other Ambulatory Visit: Payer: Self-pay

## 2020-07-21 ENCOUNTER — Ambulatory Visit (HOSPITAL_COMMUNITY)
Admission: RE | Admit: 2020-07-21 | Discharge: 2020-07-21 | Disposition: A | Discharge status: Home / Self Care | Payer: Medicare Other | Source: Ambulatory Visit | Attending: Physician Assistant | Admitting: Physician Assistant

## 2020-07-21 VITALS — BP 150/82 | HR 65 | Ht 65.0 in | Wt 213.2 lb

## 2020-07-21 DIAGNOSIS — Z79899 Other long term (current) drug therapy: Secondary | ICD-10-CM | POA: Diagnosis not present

## 2020-07-21 DIAGNOSIS — R030 Elevated blood-pressure reading, without diagnosis of hypertension: Secondary | ICD-10-CM | POA: Insufficient documentation

## 2020-07-21 DIAGNOSIS — Z6835 Body mass index (BMI) 35.0-35.9, adult: Secondary | ICD-10-CM | POA: Diagnosis not present

## 2020-07-21 DIAGNOSIS — R400 Somnolence: Secondary | ICD-10-CM | POA: Insufficient documentation

## 2020-07-21 DIAGNOSIS — Z7901 Long term (current) use of anticoagulants: Secondary | ICD-10-CM | POA: Diagnosis not present

## 2020-07-21 DIAGNOSIS — I340 Nonrheumatic mitral (valve) insufficiency: Secondary | ICD-10-CM | POA: Insufficient documentation

## 2020-07-21 DIAGNOSIS — I4819 Other persistent atrial fibrillation: Secondary | ICD-10-CM | POA: Insufficient documentation

## 2020-07-21 DIAGNOSIS — E669 Obesity, unspecified: Secondary | ICD-10-CM | POA: Insufficient documentation

## 2020-07-21 DIAGNOSIS — R0683 Snoring: Secondary | ICD-10-CM | POA: Diagnosis not present

## 2020-07-21 DIAGNOSIS — Z8249 Family history of ischemic heart disease and other diseases of the circulatory system: Secondary | ICD-10-CM | POA: Diagnosis not present

## 2020-07-21 NOTE — Progress Notes (Addendum)
Primary Care Physician: Orland Mustard, MD Primary Cardiologist: Dr Jens Som  Primary Electrophysiologist: none Referring Physician: Kriste Basque Dunn/Dr Dillard Cannon Lisa Moore is a 69 y.o. female with a history of persistent atrial fibrillation who presents for follow up in the [facility]ibr[facility]. The patient was initially diagnosed with atrial fibrillation 05/20/20 after presenting to her PCP office with migraine symptoms. She was found incidentally to be in afib with RVR and was sent to the ED. She was admitted and started on Eliquis for a CHADS2VASC score of 2 and diltiazem for rate control. She was asymptomatic with her arrhythmia. Echo done during her admission showed normal LVEF, moderate LA dilation, degenerative MV with mild MR. She reports she may have more fatigue since her afib was diagnosed but she is not sure about this. She denies signficant alcohol use but she does admit to snoring and daytime somnolence.   On follow up today, patient is s/p DCCV on 07/07/20. She reports that she feels no different in SR. She denies any bleeding issues on anticoagulation.   Today, she denies symptoms of palpitations, chest pain, shortness of breath, orthopnea, PND, lower extremity edema, dizziness, presyncope, syncope, bleeding, or neurologic sequela. The patient is tolerating medications without difficulties and is otherwise without complaint today.    Atrial Fibrillation Risk Factors:  she does have symptoms or diagnosis of sleep apnea. she does not have a history of rheumatic fever. she does not have a history of alcohol use. The patient does not have a history of early familial atrial fibrillation or other arrhythmias.  she has a BMI of Body mass index is 35.48 kg/m.Marland Kitchen Filed Weights   07/21/20 1325  Weight: 96.7 kg    Family History  Problem Relation Age of Onset  . Hypertension Mother   . Cancer Father   . COPD Father   . Diabetes Father   . Cancer Sister   .  Kidney disease Maternal Grandmother   . Cancer Paternal Grandmother      Atrial Fibrillation Management history:  Previous antiarrhythmic drugs: none Previous cardioversions: 07/07/20 Previous ablations: none CHADS2VASC score: 2 Anticoagulation history: Eliquis   Past Medical History:  Diagnosis Date  . Arthritis   . Benign hematuria   . GERD (gastroesophageal reflux disease)   . Migraines    rare  . New onset atrial fibrillation (HCC) 05/21/2020   Past Surgical History:  Procedure Laterality Date  . ABDOMINAL HYSTERECTOMY    . APPENDECTOMY    . CARDIOVERSION N/A 07/07/2020   Procedure: CARDIOVERSION;  Surgeon: Lisa Stade, MD;  Location: [facility] ENDOSCOPY;  Service: Cardiovascular;  Laterality: N/A;    Current Outpatient Medications  Medication Sig Dispense Refill  . acetaminophen (TYLENOL) 500 MG tablet Take 1,000 mg by mouth every 6 (six) hours as needed for mild pain or moderate pain.     Marland Kitchen apixaban (ELIQUIS) 5 MG TABS tablet Take 1 tablet (5 mg total) by mouth 2 (two) times daily. 60 tablet 6  . diltiazem (CARDIZEM CD) 180 MG 24 hr capsule Take 1 capsule (180 mg total) by mouth daily. 30 capsule 6   No current facility-administered medications for this encounter.    No Known Allergies  Social History   Socioeconomic History  . Marital status: Widowed    Spouse name: Not on file  . Number of children: Not on file  . Years of education: Not on file  . Highest education level: Not on file  Occupational History  .  Not on file  Tobacco Use  . Smoking status: Never Smoker  . Smokeless tobacco: Never Used  Vaping Use  . Vaping Use: Never used  Substance and Sexual Activity  . Alcohol use: Yes    Alcohol/week: 1.0 standard drink    Types: 1 Shots of liquor per week    Comment: occasional beer few times a month, not regularly  . Drug use: No  . Sexual activity: Not Currently  Other Topics Concern  . Not on file  Social History Narrative  . Not on file    Social Determinants of Health   Financial Resource Strain:   . Difficulty of Paying Living Expenses: Not on file  Food Insecurity:   . Worried About Programme researcher, broadcasting/film/video in the Last Year: Not on file  . Ran Out of Food in the Last Year: Not on file  Transportation Needs:   . Lack of Transportation (Medical): Not on file  . Lack of Transportation (Non-Medical): Not on file  Physical Activity:   . Days of Exercise per Week: Not on file  . Minutes of Exercise per Session: Not on file  Stress:   . Feeling of Stress : Not on file  Social Connections:   . Frequency of Communication with Friends and Family: Not on file  . Frequency of Social Gatherings with Friends and Family: Not on file  . Attends Religious Services: Not on file  . Active Member of Clubs or Organizations: Not on file  . Attends Banker Meetings: Not on file  . Marital Status: Not on file  Intimate Partner Violence:   . Fear of Current or Ex-Partner: Not on file  . Emotionally Abused: Not on file  . Physically Abused: Not on file  . Sexually Abused: Not on file     ROS- All systems are reviewed and negative except as per the HPI above.  Physical Exam: Vitals:   07/21/20 1325  BP: (!) 150/82  Pulse: 65  Weight: 96.7 kg  Height: 5\' 5"  (1.651 m)    GEN- The patient is well appearing obese female, alert and oriented x 3 today.   HEENT-head normocephalic, atraumatic, sclera clear, conjunctiva pink, hearing intact, trachea midline. Lungs- Clear to ausculation bilaterally, normal work of breathing Heart- Regular rate and rhythm, no murmurs, rubs or gallops  GI- soft, NT, ND, + BS Extremities- no clubbing, cyanosis, or edema MS- no significant deformity or atrophy Skin- no rash or lesion Psych- euthymic mood, full affect Neuro- strength and sensation are intact   Wt Readings from Last 3 Encounters:  07/21/20 96.7 kg  07/07/20 97.5 kg  06/19/20 97.9 kg    EKG today demonstrates SR HR 65, V6  malfunction, PR 180, QRS 72, QTc 428  Echo 05/21/20 demonstrated  1. Left ventricular ejection fraction, by estimation, is 55 to 60%. The  left ventricle has normal function. The left ventricle has no regional  wall motion abnormalities. Left ventricular diastolic parameters are  indeterminate.  2. Right ventricular systolic function is normal. The right ventricular  size is normal.  3. Left atrial size was moderately dilated.  4. The mitral valve is degenerative. Mild mitral valve regurgitation. No  evidence of mitral stenosis. Moderate mitral annular calcification.  5. The aortic valve is tricuspid. Aortic valve regurgitation is not  visualized. No aortic stenosis is present.  6. The inferior vena cava is normal in size with greater than 50%  respiratory variability, suggesting right atrial pressure of 3 mmHg.  Epic records are reviewed at length today  CHA2DS2-VASc Score = 2  The patient's score is based upon: CHF History: 0 HTN History: 0 Diabetes History: 0 Stroke History: 0 Vascular Disease History: 0      ASSESSMENT AND PLAN: 1. Persistent Atrial Fibrillation (ICD10:  I48.19) The patient's CHA2DS2-VASc score is 2, indicating a 2.2% annual risk of stroke.   S/p DCCV on 07/07/20 We discussed her stroke risk today and the guidelines for anticoagulation. With a CHADS2VASC score of < 3 she does not need long term anticoagulation per guidelines. Continue Eliquis 5 mg BID for 4 weeks post DCCV and then discontinue at that point.  Continue diltiazem 180 mg daily Patient interested in [place] mobile to screen for afib recurrence. May be a good rate control candidate given her paucity of symptoms.   2. Obesity Body mass index is 35.48 kg/m. Lifestyle modification was discussed and encouraged including regular physical activity and weight reduction.  3. Snoring/daytime somnolence Sleep study scheduled.   4. Elevated BP Normally well controlled/borderline low at recent  visits. No h/o HTN No changes today.   Follow up in the AF clinic in 6 months.    Lisa Loa PA-C Afib Clinic [facility] [address] [place], [place] [address] [phone] 07/21/2020 2:09 PM

## 2020-07-21 NOTE — Patient Instructions (Signed)
Stop Eliquis after November 25th

## 2020-07-31 ENCOUNTER — Encounter (HOSPITAL_BASED_OUTPATIENT_CLINIC_OR_DEPARTMENT_OTHER): Payer: Medicare Other | Admitting: Cardiology

## 2020-10-06 ENCOUNTER — Encounter (HOSPITAL_COMMUNITY): Payer: Self-pay | Admitting: Physician Assistant

## 2020-10-06 ENCOUNTER — Other Ambulatory Visit: Payer: Self-pay

## 2020-10-06 ENCOUNTER — Ambulatory Visit (HOSPITAL_COMMUNITY)
Admission: RE | Admit: 2020-10-06 | Discharge: 2020-10-06 | Disposition: A | Discharge status: Home / Self Care | Payer: Medicare Other | Source: Ambulatory Visit | Attending: Physician Assistant | Admitting: Physician Assistant

## 2020-10-06 VITALS — BP 110/88 | HR 87 | Ht 65.0 in | Wt 214.6 lb

## 2020-10-06 DIAGNOSIS — R0683 Snoring: Secondary | ICD-10-CM | POA: Insufficient documentation

## 2020-10-06 DIAGNOSIS — Z8249 Family history of ischemic heart disease and other diseases of the circulatory system: Secondary | ICD-10-CM | POA: Insufficient documentation

## 2020-10-06 DIAGNOSIS — R400 Somnolence: Secondary | ICD-10-CM | POA: Diagnosis not present

## 2020-10-06 DIAGNOSIS — Z79899 Other long term (current) drug therapy: Secondary | ICD-10-CM | POA: Insufficient documentation

## 2020-10-06 DIAGNOSIS — E669 Obesity, unspecified: Secondary | ICD-10-CM | POA: Insufficient documentation

## 2020-10-06 DIAGNOSIS — I4819 Other persistent atrial fibrillation: Secondary | ICD-10-CM | POA: Diagnosis not present

## 2020-10-06 DIAGNOSIS — Z6835 Body mass index (BMI) 35.0-35.9, adult: Secondary | ICD-10-CM | POA: Insufficient documentation

## 2020-10-06 MED ORDER — APIXABAN 5 MG PO TABS
5.0000 mg | ORAL_TABLET | Freq: Two times a day (BID) | ORAL | 6 refills | Status: DC
Start: 2020-10-06 — End: 2021-04-13

## 2020-10-06 NOTE — Patient Instructions (Addendum)
Restart Eliquis 5mg  twice a day    Multaq and Flecainide

## 2020-10-06 NOTE — Progress Notes (Signed)
Primary Care Physician: Orland Mustard, MD Primary Cardiologist: Dr Jens Som  Primary Electrophysiologist: none Referring Physician: Kriste Basque Dunn/Dr Dillard Cannon Lisa Moore is a 70 y.o. female with a history of persistent atrial fibrillation who presents for follow up in the [facility]ibrillation [facility]. The patient was initially diagnosed with atrial fibrillation 05/20/20 after presenting to her PCP office with migraine symptoms. She was found incidentally to be in afib with RVR and was sent to the ED. She was admitted and started on Eliquis for a CHADS2VASC score of 2 and diltiazem for rate control. She was asymptomatic with her arrhythmia. Echo done during her admission showed normal LVEF, moderate LA dilation, degenerative MV with mild MR. She reports she may have more fatigue since her afib was diagnosed but she is not sure about this. She denies signficant alcohol use but she does admit to snoring and daytime somnolence. Patient is s/p DCCV on 07/07/20.  On follow up today, patient reports she noted afib on her [employer] mobile on 09/26/20. She has had palpitations and felt more fatigued since that time. There were no specific triggers that she could identify. She has not been on anticoagulation with a low CV score.   Today, she denies symptoms of chest pain, shortness of breath, orthopnea, PND, lower extremity edema, dizziness, presyncope, syncope, bleeding, or neurologic sequela. The patient is tolerating medications without difficulties and is otherwise without complaint today.    Atrial Fibrillation Risk Factors:  she does have symptoms or diagnosis of sleep apnea. she does not have a history of rheumatic fever. she does not have a history of alcohol use. The patient does not have a history of early familial atrial fibrillation or other arrhythmias.  she has a BMI of Body mass index is 35.71 kg/m.Marland Kitchen Filed Weights   10/06/20 1501  Weight: 97.3 kg    Family History   Problem Relation Age of Onset  . Hypertension Mother   . Cancer Father   . COPD Father   . Diabetes Father   . Cancer Sister   . Kidney disease Maternal Grandmother   . Cancer Paternal Grandmother      Atrial Fibrillation Management history:  Previous antiarrhythmic drugs: none Previous cardioversions: 07/07/20 Previous ablations: none CHADS2VASC score: 2 Anticoagulation history: Eliquis   Past Medical History:  Diagnosis Date  . Arthritis   . Benign hematuria   . GERD (gastroesophageal reflux disease)   . Migraines    rare  . New onset atrial fibrillation (HCC) 05/21/2020   Past Surgical History:  Procedure Laterality Date  . ABDOMINAL HYSTERECTOMY    . APPENDECTOMY    . CARDIOVERSION N/A 07/07/2020   Procedure: CARDIOVERSION;  Surgeon: Wendall Stade, MD;  Location: [facility] ENDOSCOPY;  Service: Cardiovascular;  Laterality: N/A;    Current Outpatient Medications  Medication Sig Dispense Refill  . acetaminophen (TYLENOL) 500 MG tablet Take 1,000 mg by mouth every 6 (six) hours as needed for mild pain or moderate pain.     Marland Kitchen diltiazem (CARDIZEM CD) 180 MG 24 hr capsule Take 1 capsule (180 mg total) by mouth daily. 30 capsule 6   No current facility-administered medications for this encounter.    No Known Allergies  Social History   Socioeconomic History  . Marital status: Widowed    Spouse name: Not on file  . Number of children: Not on file  . Years of education: Not on file  . Highest education level: Not on file  Occupational History  .  Not on file  Tobacco Use  . Smoking status: Never Smoker  . Smokeless tobacco: Never Used  Vaping Use  . Vaping Use: Never used  Substance and Sexual Activity  . Alcohol use: Yes    Alcohol/week: 1.0 standard drink    Types: 1 Shots of liquor per week    Comment: occasional beer few times a month, not regularly  . Drug use: No  . Sexual activity: Not Currently  Other Topics Concern  . Not on file  Social History  Narrative  . Not on file   Social Determinants of Health   Financial Resource Strain: Not on file  Food Insecurity: Not on file  Transportation Needs: Not on file  Physical Activity: Not on file  Stress: Not on file  Social Connections: Not on file  Intimate Partner Violence: Not on file     ROS- All systems are reviewed and negative except as per the HPI above.  Physical Exam: Vitals:   10/06/20 1501  BP: 110/88  Pulse: 87  Weight: 97.3 kg  Height: 5\' 5"  (1.651 m)    GEN- The patient is well appearing obese female, alert and oriented x 3 today.   HEENT-head normocephalic, atraumatic, sclera clear, conjunctiva pink, hearing intact, trachea midline. Lungs- Clear to ausculation bilaterally, normal work of breathing Heart- irregular rate and rhythm, no murmurs, rubs or gallops  GI- soft, NT, ND, + BS Extremities- no clubbing, cyanosis, or edema MS- no significant deformity or atrophy Skin- no rash or lesion Psych- euthymic mood, full affect Neuro- strength and sensation are intact   Wt Readings from Last 3 Encounters:  10/06/20 97.3 kg  07/21/20 96.7 kg  07/07/20 97.5 kg    EKG today demonstrates Afib  Vent. rate 87 BPM QRS duration 74 ms QT/QTc 368/442 ms  Echo 05/21/20 demonstrated  1. Left ventricular ejection fraction, by estimation, is 55 to 60%. The  left ventricle has normal function. The left ventricle has no regional  wall motion abnormalities. Left ventricular diastolic parameters are  indeterminate.  2. Right ventricular systolic function is normal. The right ventricular  size is normal.  3. Left atrial size was moderately dilated.  4. The mitral valve is degenerative. Mild mitral valve regurgitation. No  evidence of mitral stenosis. Moderate mitral annular calcification.  5. The aortic valve is tricuspid. Aortic valve regurgitation is not  visualized. No aortic stenosis is present.  6. The inferior vena cava is normal in size with greater than  50%  respiratory variability, suggesting right atrial pressure of 3 mmHg.   Epic records are reviewed at length today  CHA2DS2-VASc Score = 2  The patient's score is based upon: CHF History: No HTN History: No Diabetes History: No Stroke History: No Vascular Disease History: No     ASSESSMENT AND PLAN: 1. Persistent Atrial Fibrillation (ICD10:  I48.19) The patient's CHA2DS2-VASc score is 2, indicating a 2.2% annual risk of stroke.   Patient is back in persistent rate controlled afib. We discussed therapeutic options today including AAD (flecainide, Multaq) and repeat DCCV.  Resume Eliquis 5 mg BID in anticipation of DCCV. Will likely start AAD just prior to DCCV after 3 weeks of uninterrupted anticoagulation.  Continue diltiazem 180 mg daily  2. Obesity Body mass index is 35.71 kg/m. Lifestyle modification was discussed and encouraged including regular physical activity and weight reduction.  3. Snoring/daytime somnolence Sleep study cancelled by patient. The importance of adequate treatment of sleep apnea was discussed today in order to improve  our ability to maintain sinus rhythm long term. Phone # for sleep medicine given, encouraged her to reschedule.    Follow up in the AF clinic in 2 weeks.    Jorja Loa PA-C Afib Clinic [facility] [address] [place], [place] [address] [phone] 10/06/2020 3:10 PM

## 2020-10-09 ENCOUNTER — Telehealth: Payer: Self-pay | Admitting: *Deleted

## 2020-10-09 NOTE — Telephone Encounter (Signed)
Staff message sent to Nina ok to schedule sleep study. No PA is required. Patient has Medicare. 

## 2020-10-16 ENCOUNTER — Telehealth: Payer: Self-pay | Admitting: *Deleted

## 2020-10-16 NOTE — Telephone Encounter (Signed)
Patient is scheduled for lab study on 11-30-20. Patient understands her sleep study will be done at [facility] sleep lab. Patient understands she will receive a sleep packet in a week or so. Patient understands to call if she does not receive the sleep packet in a timely manner. Patient agrees with treatment and thanked me for call.  

## 2020-10-22 ENCOUNTER — Other Ambulatory Visit: Payer: Self-pay

## 2020-10-22 ENCOUNTER — Ambulatory Visit (HOSPITAL_COMMUNITY)
Admission: RE | Admit: 2020-10-22 | Discharge: 2020-10-22 | Disposition: A | Discharge status: Home / Self Care | Payer: Medicare Other | Source: Ambulatory Visit | Attending: Physician Assistant | Admitting: Physician Assistant

## 2020-10-22 ENCOUNTER — Encounter (HOSPITAL_COMMUNITY): Payer: Self-pay | Admitting: Physician Assistant

## 2020-10-22 VITALS — BP 118/88 | HR 93 | Ht 65.0 in | Wt 215.2 lb

## 2020-10-22 DIAGNOSIS — E669 Obesity, unspecified: Secondary | ICD-10-CM | POA: Insufficient documentation

## 2020-10-22 DIAGNOSIS — Z6835 Body mass index (BMI) 35.0-35.9, adult: Secondary | ICD-10-CM | POA: Insufficient documentation

## 2020-10-22 DIAGNOSIS — Z79899 Other long term (current) drug therapy: Secondary | ICD-10-CM | POA: Insufficient documentation

## 2020-10-22 DIAGNOSIS — I4819 Other persistent atrial fibrillation: Secondary | ICD-10-CM | POA: Insufficient documentation

## 2020-10-22 DIAGNOSIS — Z7901 Long term (current) use of anticoagulants: Secondary | ICD-10-CM | POA: Diagnosis not present

## 2020-10-22 DIAGNOSIS — Z7182 Exercise counseling: Secondary | ICD-10-CM | POA: Diagnosis not present

## 2020-10-22 DIAGNOSIS — R400 Somnolence: Secondary | ICD-10-CM | POA: Insufficient documentation

## 2020-10-22 DIAGNOSIS — R0683 Snoring: Secondary | ICD-10-CM | POA: Insufficient documentation

## 2020-10-22 MED ORDER — FLECAINIDE ACETATE 50 MG PO TABS: 50.0000 mg | ORAL_TABLET | Freq: Two times a day (BID) | ORAL | 3 refills | Status: DC

## 2020-10-22 NOTE — Progress Notes (Signed)
Primary Care Physician: Orland Mustard, MD Primary Cardiologist: Dr Jens Som  Primary Electrophysiologist: none Referring Physician: Kriste Basque Moore/Dr Dillard Cannon Lisa Moore is a 70 y.o. female with a history of persistent atrial fibrillation who presents for follow up in the [facility]. The patient was initially diagnosed with atrial fibrillation 05/20/20 after presenting to her PCP office with migraine symptoms. She was found incidentally to be in afib with RVR and was sent to the ED. She was admitted and started on Eliquis for a CHADS2VASC score of 2 and diltiazem for rate control. She was asymptomatic with her arrhythmia. Echo done during her admission showed normal LVEF, moderate LA dilation, degenerative MV with mild MR. She reports she may have more fatigue since her afib was diagnosed but she is not sure about this. She denies signficant alcohol use but she does admit to snoring and daytime somnolence. Patient is s/p DCCV on 07/07/20.  Patient reports she noted afib on her Kardia mobile on 09/26/20. She has had palpitations and felt more fatigued since that time.   On follow up today, patient remains in rate controlled afib. She has been on anticoagulation with no missed doses or bleeding issues.    Today, she denies symptoms of palpitations, chest pain, shortness of breath, orthopnea, PND, lower extremity edema, dizziness, presyncope, syncope, bleeding, or neurologic sequela. The patient is tolerating medications without difficulties and is otherwise without complaint today.    Atrial Fibrillation Risk Factors:  she does have symptoms or diagnosis of sleep apnea. she does not have a history of rheumatic fever. she does not have a history of alcohol use. The patient does not have a history of early familial atrial fibrillation or other arrhythmias.  she has a BMI of Body mass index is 35.81 kg/m.Marland Kitchen Filed Weights   10/22/20 1455  Weight: 97.6 kg     Family History  Problem Relation Age of Onset  . Hypertension Mother   . Cancer Father   . COPD Father   . Diabetes Father   . Cancer Sister   . Kidney disease Maternal Grandmother   . Cancer Paternal Grandmother      Atrial Fibrillation Management history:  Previous antiarrhythmic drugs: none Previous cardioversions: 07/07/20 Previous ablations: none CHADS2VASC score: 2 Anticoagulation history: Eliquis   Past Medical History:  Diagnosis Date  . Arthritis   . Benign hematuria   . GERD (gastroesophageal reflux disease)   . Migraines    rare  . New onset atrial fibrillation (HCC) 05/21/2020   Past Surgical History:  Procedure Laterality Date  . ABDOMINAL HYSTERECTOMY    . APPENDECTOMY    . CARDIOVERSION N/A 07/07/2020   Procedure: CARDIOVERSION;  Surgeon: Wendall Stade, MD;  Location: [facility] ENDOSCOPY;  Service: Cardiovascular;  Laterality: N/A;    Current Outpatient Medications  Medication Sig Dispense Refill  . acetaminophen (TYLENOL) 500 MG tablet Take 1,000 mg by mouth every 6 (six) hours as needed for mild pain or moderate pain.     Marland Kitchen apixaban (ELIQUIS) 5 MG TABS tablet Take 1 tablet (5 mg total) by mouth 2 (two) times daily. 60 tablet 6  . diltiazem (CARDIZEM CD) 180 MG 24 hr capsule Take 1 capsule (180 mg total) by mouth daily. 30 capsule 6   No current facility-administered medications for this encounter.    No Known Allergies  Social History   Socioeconomic History  . Marital status: Widowed    Spouse name: Not on file  .  Number of children: Not on file  . Years of education: Not on file  . Highest education level: Not on file  Occupational History  . Not on file  Tobacco Use  . Smoking status: Never Smoker  . Smokeless tobacco: Never Used  Vaping Use  . Vaping Use: Never used  Substance and Sexual Activity  . Alcohol use: Yes    Alcohol/week: 1.0 standard drink    Types: 1 Shots of liquor per week    Comment: occasional beer few times a  month, not regularly  . Drug use: No  . Sexual activity: Not Currently  Other Topics Concern  . Not on file  Social History Narrative  . Not on file   Social Determinants of Health   Financial Resource Strain: Not on file  Food Insecurity: Not on file  Transportation Needs: Not on file  Physical Activity: Not on file  Stress: Not on file  Social Connections: Not on file  Intimate Partner Violence: Not on file     ROS- All systems are reviewed and negative except as per the HPI above.  Physical Exam: Vitals:   10/22/20 1455  BP: 118/88  Pulse: 93  Weight: 97.6 kg  Height: 5\' 5"  (1.651 m)    GEN- The patient is well appearing obese female, alert and oriented x 3 today.   HEENT-head normocephalic, atraumatic, sclera clear, conjunctiva pink, hearing intact, trachea midline. Lungs- Clear to ausculation bilaterally, normal work of breathing Heart- irregular rate and rhythm, no murmurs, rubs or gallops  GI- soft, NT, ND, + BS Extremities- no clubbing, cyanosis, or edema MS- no significant deformity or atrophy Skin- no rash or lesion Psych- euthymic mood, full affect Neuro- strength and sensation are intact   Wt Readings from Last 3 Encounters:  10/22/20 97.6 kg  10/06/20 97.3 kg  07/21/20 96.7 kg    EKG today demonstrates Afib  Vent. rate 93 BPM QRS duration 76 ms QT/QTc 368/457 ms  Echo 05/21/20 demonstrated  1. Left ventricular ejection fraction, by estimation, is 55 to 60%. The  left ventricle has normal function. The left ventricle has no regional  wall motion abnormalities. Left ventricular diastolic parameters are  indeterminate.  2. Right ventricular systolic function is normal. The right ventricular  size is normal.  3. Left atrial size was moderately dilated.  4. The mitral valve is degenerative. Mild mitral valve regurgitation. No  evidence of mitral stenosis. Moderate mitral annular calcification.  5. The aortic valve is tricuspid. Aortic valve  regurgitation is not  visualized. No aortic stenosis is present.  6. The inferior vena cava is normal in size with greater than 50%  respiratory variability, suggesting right atrial pressure of 3 mmHg.   Epic records are reviewed at length today  CHA2DS2-VASc Score = 2  The patient's score is based upon: CHF History: No HTN History: No Diabetes History: No Stroke History: No Vascular Disease History: No     ASSESSMENT AND PLAN: 1. Persistent Atrial Fibrillation (ICD10:  I48.19) The patient's CHA2DS2-VASc score is 2, indicating a 2.2% annual risk of stroke.   We discussed therapeutic options today including AAD (flecainide, Multaq) and DCCV. Will plan to start flecainide 50 mg BID after 3 weeks of uninterrupted anticoagulation. If she does not convert, will increase dose and plan for DCCV.  Continue Eliquis 5 mg BID Continue diltiazem 180 mg daily  2. Obesity Body mass index is 35.81 kg/m. Lifestyle modification was discussed and encouraged including regular physical activity and weight  reduction.  3. Snoring/daytime somnolence Sleep study scheduled for 11/30/20   Follow up in the AF clinic 3-4 days after starting flecainide for ECG.    Jorja Loa PA-C [facility] [address] [place], [place] [address] [phone] 10/22/2020 3:11 PM

## 2020-10-22 NOTE — Patient Instructions (Signed)
On Monday - February 14th Start Flecainide 50mg  twice a day

## 2020-10-31 ENCOUNTER — Ambulatory Visit (HOSPITAL_COMMUNITY)
Admission: RE | Admit: 2020-10-31 | Discharge: 2020-10-31 | Disposition: A | Discharge status: Home / Self Care | Payer: Medicare Other | Source: Ambulatory Visit | Attending: Physician Assistant | Admitting: Physician Assistant

## 2020-10-31 ENCOUNTER — Other Ambulatory Visit: Payer: Self-pay

## 2020-10-31 VITALS — BP 136/90 | HR 109

## 2020-10-31 DIAGNOSIS — I4819 Other persistent atrial fibrillation: Secondary | ICD-10-CM

## 2020-10-31 DIAGNOSIS — I4891 Unspecified atrial fibrillation: Secondary | ICD-10-CM | POA: Diagnosis not present

## 2020-10-31 LAB — CBC
HCT: 45.7 % (ref 36.0–46.0)
Hemoglobin: 15.5 g/dL — ABNORMAL HIGH (ref 12.0–15.0)
MCH: 30.8 pg (ref 26.0–34.0)
MCHC: 33.9 g/dL (ref 30.0–36.0)
MCV: 90.9 fL (ref 80.0–100.0)
Platelets: 315 10*3/uL (ref 150–400)
RBC: 5.03 MIL/uL (ref 3.87–5.11)
RDW: 12.9 % (ref 11.5–15.5)
WBC: 6.9 10*3/uL (ref 4.0–10.5)
nRBC: 0 % (ref 0.0–0.2)

## 2020-10-31 LAB — BASIC METABOLIC PANEL
Anion gap: 12 (ref 5–15)
BUN: 12 mg/dL (ref 8–23)
CO2: 21 mmol/L — ABNORMAL LOW (ref 22–32)
Calcium: 9.8 mg/dL (ref 8.9–10.3)
Chloride: 104 mmol/L (ref 98–111)
Creatinine, Ser: 1.11 mg/dL — ABNORMAL HIGH (ref 0.44–1.00)
GFR, Estimated: 53 mL/min — ABNORMAL LOW (ref >60)
Glucose, Bld: 111 mg/dL — ABNORMAL HIGH (ref 70–99)
Potassium: 4.1 mmol/L (ref 3.5–5.1)
Sodium: 137 mmol/L (ref 135–145)

## 2020-10-31 MED ORDER — FLECAINIDE ACETATE 50 MG PO TABS: 100.0000 mg | ORAL_TABLET | Freq: Two times a day (BID) | ORAL | 3 refills | Status: DC

## 2020-10-31 NOTE — Patient Instructions (Addendum)
Cardioversion scheduled for Friday, February 25th  - Arrive at the [facility] and go to admitting at 930AM  - Do not eat or drink anything after midnight the night prior to your procedure.  - Take all your morning medication (except diabetic medications) with a sip of water prior to arrival.  - You will not be able to drive home after your procedure.  - Do NOT miss any doses of your blood thinner - if you should miss a dose please notify our office immediately.  - If you feel as if you go back into normal rhythm prior to scheduled cardioversion, please notify our office immediately. If your procedure is canceled in the cardioversion suite you will be charged a cancellation fee.  Increase Flecainide to 100mg  twice a day

## 2020-10-31 NOTE — Progress Notes (Signed)
Patient returns for ECG after starting flecainide. ECG shows afib HR 109, QRS 80, QTc 482. Will plan to increase dose to 100 mg BID and scheduled DCCV. Follow up in the AF clinic post DCCV.  

## 2020-10-31 NOTE — H&P (View-Only) (Signed)
Patient returns for ECG after starting flecainide. ECG shows afib HR 109, QRS 80, QTc 482. Will plan to increase dose to 100 mg BID and scheduled DCCV. Follow up in the AF clinic post DCCV.

## 2020-11-04 ENCOUNTER — Other Ambulatory Visit (HOSPITAL_COMMUNITY): Payer: Medicare Other

## 2020-11-05 ENCOUNTER — Other Ambulatory Visit (HOSPITAL_COMMUNITY)
Admission: RE | Admit: 2020-11-05 | Discharge: 2020-11-05 | Disposition: A | Discharge status: Home / Self Care | Payer: Medicare Other | Source: Ambulatory Visit | Attending: Cardiovascular Disease | Admitting: Cardiovascular Disease

## 2020-11-05 DIAGNOSIS — Z20822 Contact with and (suspected) exposure to covid-19: Secondary | ICD-10-CM | POA: Diagnosis not present

## 2020-11-05 DIAGNOSIS — Z01812 Encounter for preprocedural laboratory examination: Secondary | ICD-10-CM | POA: Diagnosis not present

## 2020-11-05 LAB — SARS CORONAVIRUS 2 (TAT 6-24 HRS): SARS Coronavirus 2: NEGATIVE

## 2020-11-07 ENCOUNTER — Ambulatory Visit (HOSPITAL_COMMUNITY)
Admission: RE | Admit: 2020-11-07 | Discharge: 2020-11-07 | Disposition: A | Discharge status: Home / Self Care | Payer: Medicare Other | Source: Ambulatory Visit | Attending: Cardiovascular Disease | Admitting: Cardiovascular Disease

## 2020-11-07 ENCOUNTER — Encounter (HOSPITAL_COMMUNITY)
Admission: RE | Disposition: A | Discharge status: Home / Self Care | Payer: Self-pay | Source: Ambulatory Visit | Attending: Cardiovascular Disease

## 2020-11-07 ENCOUNTER — Encounter (HOSPITAL_COMMUNITY): Payer: Self-pay | Admitting: Cardiovascular Disease

## 2020-11-07 ENCOUNTER — Ambulatory Visit (HOSPITAL_COMMUNITY): Payer: Medicare Other | Admitting: Anesthesiology

## 2020-11-07 ENCOUNTER — Other Ambulatory Visit: Payer: Self-pay

## 2020-11-07 DIAGNOSIS — K219 Gastro-esophageal reflux disease without esophagitis: Secondary | ICD-10-CM | POA: Diagnosis not present

## 2020-11-07 DIAGNOSIS — I4891 Unspecified atrial fibrillation: Secondary | ICD-10-CM | POA: Diagnosis not present

## 2020-11-07 DIAGNOSIS — I4819 Other persistent atrial fibrillation: Secondary | ICD-10-CM | POA: Diagnosis not present

## 2020-11-07 DIAGNOSIS — E785 Hyperlipidemia, unspecified: Secondary | ICD-10-CM | POA: Diagnosis not present

## 2020-11-07 HISTORY — PX: CARDIOVERSION: SHX1299

## 2020-11-07 SURGERY — CARDIOVERSION
Anesthesia: General

## 2020-11-07 MED ORDER — LIDOCAINE 2% (20 MG/ML) 5 ML SYRINGE
INTRAMUSCULAR | Status: DC | PRN
  Administered 2020-11-07: 60 mg via INTRAVENOUS

## 2020-11-07 MED ORDER — SODIUM CHLORIDE 0.9 % IV SOLN: INTRAVENOUS | Status: DC | PRN

## 2020-11-07 MED ORDER — PROPOFOL 10 MG/ML IV BOLUS
INTRAVENOUS | Status: DC | PRN
  Administered 2020-11-07: 80 mg via INTRAVENOUS

## 2020-11-07 NOTE — Transfer of Care (Signed)
Immediate Anesthesia Transfer of Care Note  Patient: Lisa Moore  Procedure(s) Performed: CARDIOVERSION (N/A )  Patient Location: Endoscopy Unit  Anesthesia Type:General  Level of Consciousness: awake and patient cooperative  Airway & Oxygen Therapy: Patient Spontanous Breathing  Post-op Assessment: Report given to RN and Post -op Vital signs reviewed and stable  Post vital signs: Reviewed and stable  Last Vitals:  Vitals Value Taken Time  BP    Temp    Pulse 56 11/07/20 1032  Resp 22 11/07/20 1032  SpO2 96 % 11/07/20 1032  Vitals shown include unvalidated device data.  Last Pain:  Vitals:   11/07/20 0930  TempSrc: Tympanic  PainSc: 0-No pain         Complications: No complications documented.

## 2020-11-07 NOTE — Interval H&P Note (Signed)
History and Physical Interval Note:  11/07/2020 10:16 AM  Lisa Moore  has presented today for surgery, with the diagnosis of A-FIB.  The various methods of treatment have been discussed with the patient and family. After consideration of risks, benefits and other options for treatment, the patient has consented to  Procedure(s): CARDIOVERSION (N/A) as a surgical intervention.  The patient's history has been reviewed, patient examined, no change in status, stable for surgery.  I have reviewed the patient's chart and labs.  Questions were answered to the patient's satisfaction.     Charlton Haws

## 2020-11-07 NOTE — Discharge Instructions (Signed)
Electrical Cardioversion Electrical cardioversion is the delivery of a jolt of electricity to restore a normal rhythm to the heart. A rhythm that is too fast or is not regular keeps the heart from pumping well. In this procedure, sticky patches or metal paddles are placed on the chest to deliver electricity to the heart from a device. This procedure may be done in an emergency if:  There is low or no blood pressure as a result of the heart rhythm.  Normal rhythm must be restored as fast as possible to protect the brain and heart from further damage.  It may save a life. This may also be a scheduled procedure for irregular or fast heart rhythms that are not immediately life-threatening. Tell a health care provider about:  Any allergies you have.  All medicines you are taking, including vitamins, herbs, eye drops, creams, and over-the-counter medicines.  Any problems you or family members have had with anesthetic medicines.  Any blood disorders you have.  Any surgeries you have had.  Any medical conditions you have.  Whether you are pregnant or may be pregnant. What are the risks? Generally, this is a safe procedure. However, problems may occur, including:  Allergic reactions to medicines.  A blood clot that breaks free and travels to other parts of your body.  The possible return of an abnormal heart rhythm within hours or days after the procedure.  Your heart stopping (cardiac arrest). This is rare. What happens before the procedure? Medicines  Your health care provider may have you start taking: ? Blood-thinning medicines (anticoagulants) so your blood does not clot as easily. ? Medicines to help stabilize your heart rate and rhythm.  Ask your health care provider about: ? Changing or stopping your regular medicines. This is especially important if you are taking diabetes medicines or blood thinners. ? Taking medicines such as aspirin and ibuprofen. These medicines can  thin your blood. Do not take these medicines unless your health care provider tells you to take them. ? Taking over-the-counter medicines, vitamins, herbs, and supplements. General instructions  Follow instructions from your health care provider about eating or drinking restrictions.  Plan to have someone take you home from the hospital or clinic.  If you will be going home right after the procedure, plan to have someone with you for 24 hours.  Ask your health care provider what steps will be taken to help prevent infection. These may include washing your skin with a germ-killing soap. What happens during the procedure?  An IV will be inserted into one of your veins.  Sticky patches (electrodes) or metal paddles may be placed on your chest.  You will be given a medicine to help you relax (sedative).  An electrical shock will be delivered. The procedure may vary among health care providers and hospitals.   What can I expect after the procedure?  Your blood pressure, heart rate, breathing rate, and blood oxygen level will be monitored until you leave the hospital or clinic.  Your heart rhythm will be watched to make sure it does not change.  You may have some redness on the skin where the shocks were given. Follow these instructions at home:  Do not drive for 24 hours if you were given a sedative during your procedure.  Take over-the-counter and prescription medicines only as told by your health care provider.  Ask your health care provider how to check your pulse. Check it often.  Rest for 48 hours after the procedure   or as told by your health care provider.  Avoid or limit your caffeine use as told by your health care provider.  Keep all follow-up visits as told by your health care provider. This is important. Contact a health care provider if:  You feel like your heart is beating too quickly or your pulse is not regular.  You have a serious muscle cramp that does not go  away. Get help right away if:  You have discomfort in your chest.  You are dizzy or you feel faint.  You have trouble breathing or you are short of breath.  Your speech is slurred.  You have trouble moving an arm or leg on one side of your body.  Your fingers or toes turn cold or blue. Summary  Electrical cardioversion is the delivery of a jolt of electricity to restore a normal rhythm to the heart.  This procedure may be done right away in an emergency or may be a scheduled procedure if the condition is not an emergency.  Generally, this is a safe procedure.  After the procedure, check your pulse often as told by your health care provider. This information is not intended to replace advice given to you by your health care provider. Make sure you discuss any questions you have with your health care provider. Document Revised: 04/02/2019 Document Reviewed: 04/02/2019 Elsevier Patient Education  2021 [employer] Inc[employer]  

## 2020-11-07 NOTE — Anesthesia Preprocedure Evaluation (Signed)
Anesthesia Evaluation  Patient identified by MRN, date of birth, ID band Patient awake    Reviewed: Allergy & Precautions, H&P , NPO status , Patient's Chart, lab work & pertinent test results  Airway Mallampati: II   Neck ROM: full    Dental   Pulmonary neg pulmonary ROS,    breath sounds clear to auscultation       Cardiovascular + dysrhythmias Atrial Fibrillation  Rhythm:irregular Rate:Normal     Neuro/Psych  Headaches,    GI/Hepatic GERD  ,  Endo/Other  obese  Renal/GU      Musculoskeletal  (+) Arthritis ,   Abdominal   Peds  Hematology   Anesthesia Other Findings   Reproductive/Obstetrics                             Anesthesia Physical Anesthesia Plan  ASA: II  Anesthesia Plan: General   Post-op Pain Management:    Induction: Intravenous  PONV Risk Score and Plan: 3 and Propofol infusion and Treatment may vary due to age or medical condition  Airway Management Planned: Mask  Additional Equipment:   Intra-op Plan:   Post-operative Plan:   Informed Consent: I have reviewed the patients History and Physical, chart, labs and discussed the procedure including the risks, benefits and alternatives for the proposed anesthesia with the patient or authorized representative who has indicated his/her understanding and acceptance.     Dental advisory given  Plan Discussed with: CRNA, Anesthesiologist and Surgeon  Anesthesia Plan Comments:         Anesthesia Quick Evaluation

## 2020-11-07 NOTE — Anesthesia Procedure Notes (Signed)
Procedure Name: General with mask airway Date/Time: 11/07/2020 10:26 AM Performed by: Adria Dill, CRNA Pre-anesthesia Checklist: Patient identified, Emergency Drugs available, Suction available, Patient being monitored and Timeout performed Patient Re-evaluated:Patient Re-evaluated prior to induction Oxygen Delivery Method: Ambu bag Preoxygenation: Pre-oxygenation with 100% oxygen Induction Type: IV induction Placement Confirmation: positive ETCO2 and breath sounds checked- equal and bilateral Dental Injury: Teeth and Oropharynx as per pre-operative assessment

## 2020-11-07 NOTE — CV Procedure (Signed)
DCC: Anesthesia: Propofol On Rx Eliquis with no missed doses  DCC x 2 150J -> 200 J  Converted from afib rate 98 to NSR rate 62 bpm  No immediate neurologic sequelae  Charlton Haws MD [facility]

## 2020-11-10 ENCOUNTER — Encounter (HOSPITAL_COMMUNITY): Payer: Self-pay | Admitting: Cardiovascular Disease

## 2020-11-11 NOTE — Anesthesia Postprocedure Evaluation (Signed)
Anesthesia Post Note  Patient: Lisa Moore  Procedure(s) Performed: CARDIOVERSION (N/A )     Patient location during evaluation: Endoscopy Anesthesia Type: General Level of consciousness: awake and alert Pain management: pain level controlled Vital Signs Assessment: post-procedure vital signs reviewed and stable Respiratory status: spontaneous breathing, nonlabored ventilation, respiratory function stable and patient connected to nasal cannula oxygen Cardiovascular status: blood pressure returned to baseline and stable Postop Assessment: no apparent nausea or vomiting Anesthetic complications: no   No complications documented.  Last Vitals:  Vitals:   11/07/20 1044 11/07/20 1053  BP: 106/74 129/71  Pulse: (!) 53 (!) 57  Resp: 14 13  Temp:    SpO2: 97% 97%    Last Pain:  Vitals:   11/07/20 1053  TempSrc:   PainSc: 0-No pain                 HODIERNE,ADAM S

## 2020-11-11 NOTE — Interval H&P Note (Signed)
History and Physical Interval Note:  11/11/2020 4:56 PM  Lisa Moore  has presented today for surgery, with the diagnosis of A-FIB.  The various methods of treatment have been discussed with the patient and family. After consideration of risks, benefits and other options for treatment, the patient has consented to  Procedure(s): CARDIOVERSION (N/A) as a surgical intervention.  The patient's history has been reviewed, patient examined, no change in status, stable for surgery.  I have reviewed the patient's chart and labs.  Questions were answered to the patient's satisfaction.     Charlton Haws

## 2020-11-13 ENCOUNTER — Other Ambulatory Visit: Payer: Self-pay

## 2020-11-13 ENCOUNTER — Ambulatory Visit (HOSPITAL_COMMUNITY)
Admission: RE | Admit: 2020-11-13 | Discharge: 2020-11-13 | Disposition: A | Discharge status: Home / Self Care | Payer: Medicare Other | Source: Ambulatory Visit | Attending: Physician Assistant | Admitting: Physician Assistant

## 2020-11-13 ENCOUNTER — Encounter (HOSPITAL_COMMUNITY): Payer: Self-pay | Admitting: Physician Assistant

## 2020-11-13 VITALS — BP 128/76 | HR 99 | Ht 65.0 in | Wt 213.6 lb

## 2020-11-13 DIAGNOSIS — I4819 Other persistent atrial fibrillation: Secondary | ICD-10-CM | POA: Diagnosis not present

## 2020-11-13 DIAGNOSIS — R400 Somnolence: Secondary | ICD-10-CM | POA: Diagnosis not present

## 2020-11-13 DIAGNOSIS — R0683 Snoring: Secondary | ICD-10-CM | POA: Insufficient documentation

## 2020-11-13 DIAGNOSIS — I4892 Unspecified atrial flutter: Secondary | ICD-10-CM | POA: Insufficient documentation

## 2020-11-13 DIAGNOSIS — I484 Atypical atrial flutter: Secondary | ICD-10-CM | POA: Diagnosis not present

## 2020-11-13 DIAGNOSIS — Z7901 Long term (current) use of anticoagulants: Secondary | ICD-10-CM | POA: Diagnosis not present

## 2020-11-13 DIAGNOSIS — E669 Obesity, unspecified: Secondary | ICD-10-CM | POA: Insufficient documentation

## 2020-11-13 DIAGNOSIS — Z6835 Body mass index (BMI) 35.0-35.9, adult: Secondary | ICD-10-CM | POA: Diagnosis not present

## 2020-11-13 DIAGNOSIS — I483 Typical atrial flutter: Secondary | ICD-10-CM

## 2020-11-13 DIAGNOSIS — Z79899 Other long term (current) drug therapy: Secondary | ICD-10-CM | POA: Diagnosis not present

## 2020-11-13 HISTORY — DX: Typical atrial flutter: I48.3

## 2020-11-13 NOTE — Progress Notes (Signed)
Primary Care Physician: Orland Mustard, MD Primary Cardiologist: Dr Jens Som  Primary Electrophysiologist: none Referring Physician: Kriste Basque Dunn/Dr Dillard Cannon Holub is a 70 y.o. female with a history of persistent atrial fibrillation who presents for follow up in the [facility]. The patient was initially diagnosed with atrial fibrillation 05/20/20 after presenting to her PCP office with migraine symptoms. She was found incidentally to be in afib with RVR and was sent to the ED. She was admitted and started on Eliquis for a CHADS2VASC score of 2 and diltiazem for rate control. She was asymptomatic with her arrhythmia. Echo done during her admission showed normal LVEF, moderate LA dilation, degenerative MV with mild MR. She reports she may have more fatigue since her afib was diagnosed but she is not sure about this. She denies signficant alcohol use but she does admit to snoring and daytime somnolence. Patient is s/p DCCV on 07/07/20.  On follow up today, patient is s/p DCCV on 11/07/20 after starting flecainide. She reports that up until today, her Lourena Simmonds mobile showed SR with HR in 60s. She appears to be back in atypical flutter today. She is unaware of her arrhythmia. She denies any bleeding issues on anticoagulation. She did have some chest soreness post DCCV but this is resolving.   Today, she denies symptoms of palpitations, chest pain, shortness of breath, orthopnea, PND, lower extremity edema, dizziness, presyncope, syncope, bleeding, or neurologic sequela. The patient is tolerating medications without difficulties and is otherwise without complaint today.    Atrial Fibrillation Risk Factors:  she does have symptoms or diagnosis of sleep apnea. she does not have a history of rheumatic fever. she does not have a history of alcohol use. The patient does not have a history of early familial atrial fibrillation or other arrhythmias.  she has a BMI of Body  mass index is 35.54 kg/m.Marland Kitchen Filed Weights   11/13/20 1419  Weight: 96.9 kg    Family History  Problem Relation Age of Onset  . Hypertension Mother   . Cancer Father   . COPD Father   . Diabetes Father   . Cancer Sister   . Kidney disease Maternal Grandmother   . Cancer Paternal Grandmother      Atrial Fibrillation Management history:  Previous antiarrhythmic drugs: flecainide Previous cardioversions: 07/07/20, 11/07/20 Previous ablations: none CHADS2VASC score: 2 Anticoagulation history: Eliquis   Past Medical History:  Diagnosis Date  . Arthritis   . Benign hematuria   . GERD (gastroesophageal reflux disease)   . Migraines    rare  . New onset atrial fibrillation (HCC) 05/21/2020   Past Surgical History:  Procedure Laterality Date  . ABDOMINAL HYSTERECTOMY    . APPENDECTOMY    . CARDIOVERSION N/A 07/07/2020   Procedure: CARDIOVERSION;  Surgeon: Wendall Stade, MD;  Location: [facility] ENDOSCOPY;  Service: Cardiovascular;  Laterality: N/A;  . CARDIOVERSION N/A 11/07/2020   Procedure: CARDIOVERSION;  Surgeon: Wendall Stade, MD;  Location: [facility] ENDOSCOPY;  Service: Cardiovascular;  Laterality: N/A;    Current Outpatient Medications  Medication Sig Dispense Refill  . acetaminophen (TYLENOL) 650 MG CR tablet Take 1,300 mg by mouth every 8 (eight) hours as needed for pain.    Marland Kitchen apixaban (ELIQUIS) 5 MG TABS tablet Take 1 tablet (5 mg total) by mouth 2 (two) times daily. 60 tablet 6  . diltiazem (CARDIZEM CD) 180 MG 24 hr capsule Take 1 capsule (180 mg total) by mouth daily. 30 capsule 6  .  flecainide (TAMBOCOR) 50 MG tablet Take 2 tablets (100 mg total) by mouth 2 (two) times daily. 60 tablet 3   No current facility-administered medications for this encounter.    No Known Allergies  Social History   Socioeconomic History  . Marital status: Widowed    Spouse name: Not on file  . Number of children: Not on file  . Years of education: Not on file  . Highest education  level: Not on file  Occupational History  . Not on file  Tobacco Use  . Smoking status: Never Smoker  . Smokeless tobacco: Never Used  Vaping Use  . Vaping Use: Never used  Substance and Sexual Activity  . Alcohol use: Yes    Alcohol/week: 1.0 standard drink    Types: 1 Shots of liquor per week    Comment: occasional beer few times a month, not regularly  . Drug use: No  . Sexual activity: Not Currently  Other Topics Concern  . Not on file  Social History Narrative  . Not on file   Social Determinants of Health   Financial Resource Strain: Not on file  Food Insecurity: Not on file  Transportation Needs: Not on file  Physical Activity: Not on file  Stress: Not on file  Social Connections: Not on file  Intimate Partner Violence: Not on file     ROS- All systems are reviewed and negative except as per the HPI above.  Physical Exam: Vitals:   11/13/20 1419  BP: 128/76  Pulse: 99  Weight: 96.9 kg  Height: 5\' 5"  (1.651 m)    GEN- The patient is a well appearing obese female, alert and oriented x 3 today.   HEENT-head normocephalic, atraumatic, sclera clear, conjunctiva pink, hearing intact, trachea midline. Lungs- Clear to ausculation bilaterally, normal work of breathing Heart- irregular rate and rhythm, no murmurs, rubs or gallops  GI- soft, NT, ND, + BS Extremities- no clubbing, cyanosis, or edema MS- no significant deformity or atrophy Skin- no rash or lesion Psych- euthymic mood, full affect Neuro- strength and sensation are intact   Wt Readings from Last 3 Encounters:  11/13/20 96.9 kg  11/07/20 90.7 kg  10/22/20 97.6 kg    EKG today demonstrates Atypical atrial flutter Vent. rate 99 BPM QRS duration 96 ms QT/QTc 420/539 ms  Echo 05/21/20 demonstrated  1. Left ventricular ejection fraction, by estimation, is 55 to 60%. The  left ventricle has normal function. The left ventricle has no regional  wall motion abnormalities. Left ventricular diastolic  parameters are  indeterminate.  2. Right ventricular systolic function is normal. The right ventricular  size is normal.  3. Left atrial size was moderately dilated.  4. The mitral valve is degenerative. Mild mitral valve regurgitation. No  evidence of mitral stenosis. Moderate mitral annular calcification.  5. The aortic valve is tricuspid. Aortic valve regurgitation is not  visualized. No aortic stenosis is present.  6. The inferior vena cava is normal in size with greater than 50%  respiratory variability, suggesting right atrial pressure of 3 mmHg.   Epic records are reviewed at length today  CHA2DS2-VASc Score = 2  The patient's score is based upon: CHF History: No HTN History: No Diabetes History: No Stroke History: No Vascular Disease History: No     ASSESSMENT AND PLAN: 1. Persistent Atrial Fibrillation/atrial flutter The patient's CHA2DS2-VASc score is 2, indicating a 2.2% annual risk of stroke.   S/p DCCV on 11/07/20 Patient in atrial flutter today. We discussed therapeutic  options including alternate AAD (dofetlide, amiodarone), ablation, and rate control. Information given about dofetilide and ablation, she would like to discuss with EP. Ultimately, rate control may be a good option for her given her paucity of symptoms.  Will stop flecainide. Continue Eliquis 5 mg BID Continue diltiazem 180 mg daily  2. Obesity Body mass index is 35.54 kg/m. Lifestyle modification was discussed and encouraged including regular physical activity and weight reduction.  3. Snoring/daytime somnolence Sleep study scheduled for 11/30/20.   Follow up with EP to discuss ablation vs dofetilide vs rate control.    Jorja Loa PA-C Af[facility] [address] [place], [place] [address] [phone] 11/13/2020 2:33 PM

## 2020-11-13 NOTE — Patient Instructions (Signed)
Stop flecainide 

## 2020-11-28 ENCOUNTER — Other Ambulatory Visit: Payer: Self-pay

## 2020-11-28 ENCOUNTER — Encounter: Payer: Self-pay | Admitting: *Deleted

## 2020-11-28 ENCOUNTER — Ambulatory Visit (INDEPENDENT_AMBULATORY_CARE_PROVIDER_SITE_OTHER): Payer: Medicare Other | Admitting: Internal Medicine

## 2020-11-28 ENCOUNTER — Encounter: Payer: Self-pay | Admitting: Internal Medicine

## 2020-11-28 VITALS — BP 114/76 | HR 87 | Ht 65.0 in | Wt 214.4 lb

## 2020-11-28 DIAGNOSIS — I4891 Unspecified atrial fibrillation: Secondary | ICD-10-CM | POA: Diagnosis not present

## 2020-11-28 DIAGNOSIS — I4819 Other persistent atrial fibrillation: Secondary | ICD-10-CM | POA: Diagnosis not present

## 2020-11-28 MED ORDER — METOPROLOL TARTRATE 50 MG PO TABS: 50.0000 mg | ORAL_TABLET | Freq: Once | ORAL | 0 refills | Status: DC | PRN

## 2020-11-28 NOTE — Patient Instructions (Addendum)
Medication Instructions:  Your physician recommends that you continue on your current medications as directed. Please refer to the Current Medication list given to you today.  Labwork: None ordered.  Testing/Procedures: Your physician has requested that you have cardiac CT. Cardiac computed tomography (CT) is a painless test that uses an x-ray machine to take clear, detailed pictures of your heart. For further information please visit www.cardiosmart.org. Please follow instruction sheet as given.  Your physician has recommended that you have an ablation. Catheter ablation is a medical procedure used to treat some cardiac arrhythmias (irregular heartbeats). During catheter ablation, a long, thin, flexible tube is put into a blood vessel in your groin (upper thigh), or neck. This tube is called an ablation catheter. It is then guided to your heart through the blood vessel. Radio frequency waves destroy small areas of heart tissue where abnormal heartbeats may cause an arrhythmia to start. Please see the instruction sheet given to you today.    Any Other Special Instructions Will Be Listed Below (If Applicable).  If you need a refill on your cardiac medications before your next appointment, please call your pharmacy.   . Cardiac Ablation Cardiac ablation is a procedure to destroy (ablate) some heart tissue that is sending bad signals. These bad signals cause problems in heart rhythm. The heart has many areas that make these signals. If there are problems in these areas, they can make the heart beat in a way that is not normal. Destroying some tissues can help make the heart rhythm normal. Tell your doctor about:  Any allergies you have.  All medicines you are taking. These include vitamins, herbs, eye drops, creams, and over-the-counter medicines.  Any problems you or family members have had with medicines that make you fall asleep (anesthetics).  Any blood disorders you have.  Any  surgeries you have had.  Any medical conditions you have, such as kidney failure.  Whether you are pregnant or may be pregnant. What are the risks? This is a safe procedure. But problems may occur, including:  Infection.  Bruising and bleeding.  Bleeding into the chest.  Stroke or blood clots.  Damage to nearby areas of your body.  Allergies to medicines or dyes.  The need for a pacemaker if the normal system is damaged.  Failure of the procedure to treat the problem. What happens before the procedure? Medicines Ask your doctor about:  Changing or stopping your normal medicines. This is important.  Taking aspirin and ibuprofen. Do not take these medicines unless your doctor tells you to take them.  Taking other medicines, vitamins, herbs, and supplements. General instructions  Follow instructions from your doctor about what you cannot eat or drink.  Plan to have someone take you home from the hospital or clinic.  If you will be going home right after the procedure, plan to have someone with you for 24 hours.  Ask your doctor what steps will be taken to prevent infection. What happens during the procedure?  An IV tube will be put into one of your veins.  You will be given a medicine to help you relax.  The skin on your neck or groin will be numbed.  A cut (incision) will be made in your neck or groin. A needle will be put through your cut and into a large vein.  A tube (catheter) will be put into the needle. The tube will be moved to your heart.  Dye may be put through the tube. This helps   your doctor see your heart.  Small devices (electrodes) on the tube will send out signals.  A type of energy will be used to destroy some heart tissue.  The tube will be taken out.  Pressure will be held on your cut. This helps stop bleeding.  A bandage will be put over your cut. The exact procedure may vary among doctors and hospitals.   What happens after the  procedure?  You will be watched until you leave the hospital or clinic. This includes checking your heart rate, breathing rate, oxygen, and blood pressure.  Your cut will be watched for bleeding. You will need to lie still for a few hours.  Do not drive for 24 hours or as long as your doctor tells you. Summary  Cardiac ablation is a procedure to destroy some heart tissue. This is done to treat heart rhythm problems.  Tell your doctor about any medical conditions you may have. Tell him or her about all medicines you are taking to treat them.  This is a safe procedure. But problems may occur. These include infection, bruising, bleeding, and damage to nearby areas of your body.  Follow what your doctor tells you about food and drink. You may also be told to change or stop some of your medicines.  After the procedure, do not drive for 24 hours or as long as your doctor tells you. This information is not intended to replace advice given to you by your health care provider. Make sure you discuss any questions you have with your health care provider. Document Revised: 08/02/2019 Document Reviewed: 08/02/2019 Elsevier Patient Education  2021 Elsevier Inc.        

## 2020-11-28 NOTE — Progress Notes (Signed)
Electrophysiology Office Note   Date:  11/28/2020   ID:  Lisa Moore, DOB 1950-10-04, MRN [id number]  PCP:  Orland Mustard, MD  Cardiologist:  Dr Jens Som Primary Electrophysiologist: Hillis Range, MD    CC: afib   History of Present Illness: Lisa Moore is a 70 y.o. female who presents today for electrophysiology evaluation.   She is referred by Dr Jens Som and Jorja Loa for EP consultation regarding afib.  She was initially diagnosed with afib 05/20/20 after presenting to PCP.  She was started on eliquis.  She reports fatigue and decreased exercise tolerance with afib.  She has failed medical therapy with flecainide. She was cardioverted 11/07/20.  On return 11/13/20, she was found to have atrial flutter.  Today, she denies symptoms of palpitations, chest pain, shortness of breath, orthopnea, PND, lower extremity edema, claudication, dizziness, presyncope, syncope, bleeding, or neurologic sequela. The patient is tolerating medications without difficulties and is otherwise without complaint today.    Past Medical History:  Diagnosis Date  . Arthritis   . Benign hematuria   . GERD (gastroesophageal reflux disease)   . Migraines    rare  . New onset atrial fibrillation (HCC) 05/21/2020   Past Surgical History:  Procedure Laterality Date  . ABDOMINAL HYSTERECTOMY    . APPENDECTOMY    . CARDIOVERSION N/A 07/07/2020   Procedure: CARDIOVERSION;  Surgeon: Wendall Stade, MD;  Location: [facility] ENDOSCOPY;  Service: Cardiovascular;  Laterality: N/A;  . CARDIOVERSION N/A 11/07/2020   Procedure: CARDIOVERSION;  Surgeon: Wendall Stade, MD;  Location: [facility] ENDOSCOPY;  Service: Cardiovascular;  Laterality: N/A;     Current Outpatient Medications  Medication Sig Dispense Refill  . acetaminophen (TYLENOL) 650 MG CR tablet Take 1,300 mg by mouth every 8 (eight) hours as needed for pain.    Marland Kitchen apixaban (ELIQUIS) 5 MG TABS tablet Take 1 tablet (5 mg total) by mouth 2 (two) times daily. 60  tablet 6  . diltiazem (CARDIZEM CD) 180 MG 24 hr capsule Take 1 capsule (180 mg total) by mouth daily. 30 capsule 6   No current facility-administered medications for this visit.    Allergies:   Patient has no known allergies.   Social History:  The patient  reports that she has never smoked. She has never used smokeless tobacco. She reports current alcohol use of about 1.0 standard drink of alcohol per week. She reports that she does not use drugs.   Family History:  The patient's  family history includes COPD in her father; Cancer in her father, paternal grandmother, and sister; Diabetes in her father; Hypertension in her mother; Kidney disease in her maternal grandmother.    ROS:  Please see the history of present illness.   All other systems are personally reviewed and negative.    PHYSICAL EXAM: VS:  BP 114/76   Pulse 87   Ht 5\' 5"  (1.651 m)   Wt 214 lb 6.4 oz (97.3 kg)   LMP  (LMP Unknown)   SpO2 98%   BMI 35.68 kg/m  , BMI Body mass index is 35.68 kg/m. GEN: Well nourished, well developed, in no acute distress HEENT: normal Neck: no JVD, carotid bruits, or masses Cardiac: iRRR; no murmurs, rubs, or gallops,no edema  Respiratory:  clear to auscultation bilaterally, normal work of breathing GI: soft, nontender, nondistended, + BS MS: no deformity or atrophy Skin: warm and dry  Neuro:  Strength and sensation are intact Psych: euthymic mood, full affect  EKG:  EKG  is ordered today. The ekg ordered today is personally reviewed and shows afib, V rate 87 bpm, Qtc 433 msec  Echo 05/21/20- EF 55%, moderate LA enlargement (4.5cm), mild MR  Recent Labs: 05/20/2020: ALT 15 05/21/2020: Magnesium 1.8; TSH 3.286 10/31/2020: BUN 12; Creatinine, Ser 1.11; Hemoglobin 15.5; Platelets 315; Potassium 4.1; Sodium 137  personally reviewed   Lipid Panel     Component Value Date/Time   CHOL 222 (H) 03/31/2018 0914   TRIG 104.0 03/31/2018 0914   HDL 62.20 03/31/2018 0914   CHOLHDL 4  03/31/2018 0914   VLDL 20.8 03/31/2018 0914   LDLCALC 139 (H) 03/31/2018 0914   personally reviewed   Wt Readings from Last 3 Encounters:  11/28/20 214 lb 6.4 oz (97.3 kg)  11/13/20 213 lb 9.6 oz (96.9 kg)  11/07/20 200 lb (90.7 kg)      Other studies personally reviewed: Additional studies/ records that were reviewed today include: AF clinic notes, prior ekgs, echo  Review of the above records today demonstrates: as above   ASSESSMENT AND PLAN:  1.  Persistent afib/ atrial flutter The patient has symptomatic, recurrent  atrial fibrillation.  ekg 11/13/20 suggests isthmus dependant atrial flutter also she has failed medical therapy with flecainide. Chads2vasc score is 2.  she is anticoagulated with eliquis . Therapeutic strategies for afib including medicine (tikosyn) and ablation were discussed in detail with the patient today. Risk, benefits, and alternatives to EP study and radiofrequency ablation for afib were also discussed in detail today. These risks include but are not limited to stroke, bleeding, vascular damage, tamponade, perforation, damage to the esophagus, lungs, and other structures, pulmonary vein stenosis, worsening renal function, and death. The patient understands these risk and wishes to proceed.  We will therefore proceed with catheter ablation at the next available time.  Carto, ICE, anesthesia are requested for the procedure.  Will also obtain cardiac CT prior to the procedure to exclude LAA thrombus and further evaluate atrial anatomy.   Risks, benefits and potential toxicities for medications prescribed and/or refilled reviewed with patient today.   2. Obesity Body mass index is 35.68 kg/m. Lifestyle modification is advised  Signed, Hillis Range, MD  11/28/2020 12:56 PM     [facility] [address] [place] [address] [phone] (office) [phone] (fax)

## 2020-11-29 ENCOUNTER — Other Ambulatory Visit: Payer: Self-pay | Admitting: Internal Medicine

## 2020-11-30 ENCOUNTER — Ambulatory Visit (HOSPITAL_BASED_OUTPATIENT_CLINIC_OR_DEPARTMENT_OTHER): Payer: Medicare Other | Attending: Physician Assistant | Admitting: Cardiology

## 2020-11-30 ENCOUNTER — Other Ambulatory Visit: Payer: Self-pay

## 2020-11-30 DIAGNOSIS — I493 Ventricular premature depolarization: Secondary | ICD-10-CM | POA: Insufficient documentation

## 2020-11-30 DIAGNOSIS — Z72820 Sleep deprivation: Secondary | ICD-10-CM | POA: Insufficient documentation

## 2020-11-30 DIAGNOSIS — I4819 Other persistent atrial fibrillation: Secondary | ICD-10-CM | POA: Insufficient documentation

## 2020-11-30 DIAGNOSIS — Z7901 Long term (current) use of anticoagulants: Secondary | ICD-10-CM | POA: Diagnosis not present

## 2020-12-01 ENCOUNTER — Other Ambulatory Visit: Payer: Self-pay | Admitting: Internal Medicine

## 2020-12-07 ENCOUNTER — Other Ambulatory Visit: Payer: Self-pay | Admitting: Physician Assistant

## 2020-12-08 MED ORDER — DILTIAZEM HCL ER COATED BEADS 180 MG PO CP24: 180.0000 mg | ORAL_CAPSULE | Freq: Every day | ORAL | 11 refills | Status: DC

## 2020-12-09 ENCOUNTER — Telehealth: Payer: Self-pay | Admitting: Family Medicine

## 2020-12-09 NOTE — Telephone Encounter (Signed)
Spoke to patient to  schedule Medicare Annual Wellness Visit (AWV) either virtually or in office.  She stated she would call back to schedule  Last AWV 03/31/18  please schedule at anytime    This should be a 45 minute visit.

## 2020-12-09 NOTE — Procedures (Signed)
   Patient Name: Lisa Moore, Appleman Date: 11/30/2020 Gender: Female D.O.B: [birth year] Age (years): 2 Referring Provider: Alphonzo Severance PA Height (inches): 65 Interpreting Physician: Armanda Magic MD, ABSM Weight (lbs): 210 RPSGT: Rolene Arbour BMI: 35 MRN: [id number] Neck Size: 15.50  CLINICAL INFORMATION Sleep Study Type: NPSG  Indication for sleep study: N/A  Epworth Sleepiness Score: 6  SLEEP STUDY TECHNIQUE As per the AASM Manual for the Scoring of Sleep and Associated Events v2.3 (April 2016) with a hypopnea requiring 4% desaturations.  The channels recorded and monitored were frontal, central and occipital EEG, electrooculogram (EOG), submentalis EMG (chin), nasal and oral airflow, thoracic and abdominal wall motion, anterior tibialis EMG, snore microphone, electrocardiogram, and pulse oximetry.  MEDICATIONS Medications self-administered by patient taken the night of the study : ELIQUIS  SLEEP ARCHITECTURE The study was initiated at 10:25:05 PM and ended at 4:57:10 AM.  Sleep onset time was 266.9 minutes and the sleep efficiency was 22.8%. The total sleep time was 89.5 minutes.  Stage REM latency was 121.5 minutes.  The patient spent 10.1% of the night in stage N1 sleep, 88.8% in stage N2 sleep, 0.0% in stage N3 and 1.1% in REM.  Alpha intrusion was absent.  Supine sleep was 15.08%.  RESPIRATORY PARAMETERS The overall apnea/hypopnea index (AHI) was 1.3 per hour. There were 0 total apneas, including 0 obstructive, 0 central and 0 mixed apneas. There were 2 hypopneas and 0 RERAs.  The AHI during Stage REM sleep was 0.0 per hour.  AHI while supine was 4.4 per hour.  The mean oxygen saturation was 93.3%. The minimum SpO2 during sleep was 91.0%.  soft snoring was noted during this study.  CARDIAC DATA The 2 lead EKG demonstrated Atrial fibrillation. The mean heart rate was 76.5 beats per minute. Other EKG findings include: PVCs.  LEG MOVEMENT DATA The  total PLMS were 0 with a resulting PLMS index of 0.0. Associated arousal with leg movement index was 3.4 .  IMPRESSIONS - No significant obstructive sleep apnea occurred during this study (AHI = 1.3/h). - The patient had minimal or no oxygen desaturation during the study (Min O2 = 91.0%) - The patient snored with soft snoring volume. - EKG findings include Atrial Fibrillation and PVCs. - Clinically significant periodic limb movements did not occur during sleep. No significant associated arousals.  DIAGNOSIS - Nondiagnostic study due to inadequate sleep time.   RECOMMENDATIONS - Repeat PSG with sleep aide.  - Avoid alcohol, sedatives and other CNS depressants that may worsen sleep apnea and disrupt normal sleep architecture. - Sleep hygiene should be reviewed to assess factors that may improve sleep quality. - Weight management and regular exercise should be initiated or continued if appropriate.  [Electronically signed] 12/09/2020 08:35 PM  Armanda Magic MD, ABSM Diplomate, American Board of Sleep Medicine

## 2020-12-12 ENCOUNTER — Telehealth: Payer: Self-pay | Admitting: *Deleted

## 2020-12-12 DIAGNOSIS — R0683 Snoring: Secondary | ICD-10-CM

## 2020-12-12 DIAGNOSIS — I4819 Other persistent atrial fibrillation: Secondary | ICD-10-CM

## 2020-12-12 MED ORDER — ESZOPICLONE 1 MG PO TABS: 1.0000 mg | ORAL_TABLET | Freq: Every day | ORAL | Status: AC

## 2020-12-12 NOTE — Telephone Encounter (Signed)
-----   Message from Quintella Reichert, MD sent at 12/09/2020  8:37 PM EDT ----- Nondiagnostic study due to inadequate sleep time.  Need to repeat PSG with sleep aide.  Please contact ordering MD for prescription for sleep aide

## 2020-12-12 NOTE — Telephone Encounter (Signed)
Informed patient of sleep study results and patient understanding was verbalized. Patient understands her sleep study showed Nondiagnostic study due to inadequate sleep time. Need to repeat PSG with sleep aide. Patient is aware of her sleep results. Left detailed message on voicemail and informed patient to call back with questions  Message sent to ordering provider to write order for sleep aide.

## 2020-12-15 NOTE — Telephone Encounter (Signed)
Patient called to decline having the cpap titration she did not find the test productive and does not want a sleep aide or a second round of testing.

## 2020-12-26 ENCOUNTER — Other Ambulatory Visit: Payer: Self-pay

## 2020-12-26 ENCOUNTER — Other Ambulatory Visit: Payer: Medicare Other | Admitting: *Deleted

## 2020-12-26 DIAGNOSIS — I4819 Other persistent atrial fibrillation: Secondary | ICD-10-CM

## 2020-12-26 DIAGNOSIS — I4891 Unspecified atrial fibrillation: Secondary | ICD-10-CM | POA: Diagnosis not present

## 2020-12-26 LAB — CBC WITH DIFFERENTIAL/PLATELET
Basophils Absolute: 0.1 10*3/uL (ref 0.0–0.2)
Basos: 1 %
EOS (ABSOLUTE): 0.2 10*3/uL (ref 0.0–0.4)
Eos: 2 %
Hematocrit: 45.4 % (ref 34.0–46.6)
Hemoglobin: 15.1 g/dL (ref 11.1–15.9)
Immature Grans (Abs): 0 10*3/uL (ref 0.0–0.1)
Immature Granulocytes: 0 %
Lymphocytes Absolute: 3.3 10*3/uL — ABNORMAL HIGH (ref 0.7–3.1)
Lymphs: 43 %
MCH: 30.8 pg (ref 26.6–33.0)
MCHC: 33.3 g/dL (ref 31.5–35.7)
MCV: 93 fL (ref 79–97)
Monocytes Absolute: 0.6 10*3/uL (ref 0.1–0.9)
Monocytes: 7 %
Neutrophils Absolute: 3.6 10*3/uL (ref 1.4–7.0)
Neutrophils: 47 %
Platelets: 325 10*3/uL (ref 150–450)
RBC: 4.9 x10E6/uL (ref 3.77–5.28)
RDW: 12.3 % (ref 11.7–15.4)
WBC: 7.8 10*3/uL (ref 3.4–10.8)

## 2020-12-26 LAB — BASIC METABOLIC PANEL
BUN/Creatinine Ratio: 16 (ref 12–28)
BUN: 17 mg/dL (ref 8–27)
CO2: 21 mmol/L (ref 20–29)
Calcium: 9.8 mg/dL (ref 8.7–10.3)
Chloride: 105 mmol/L (ref 96–106)
Creatinine, Ser: 1.07 mg/dL — ABNORMAL HIGH (ref 0.57–1.00)
Glucose: 97 mg/dL (ref 65–99)
Potassium: 4.6 mmol/L (ref 3.5–5.2)
Sodium: 142 mmol/L (ref 134–144)
eGFR: 56 mL/min/{1.73_m2} — ABNORMAL LOW (ref >59)

## 2021-01-07 ENCOUNTER — Telehealth (HOSPITAL_COMMUNITY): Payer: Self-pay | Admitting: Emergency Medicine

## 2021-01-07 NOTE — Telephone Encounter (Signed)
Pt returning phone call regarding upcoming cardiac imaging study; pt verbalizes understanding of appt date/time, parking situation and where to check in, pre-test NPO status and medications ordered, and verified current allergies; name and call back number provided for further questions should they arise Obdulio Mash RN Navigator Cardiac Imaging [facility] [phone] office [phone] cell   

## 2021-01-07 NOTE — Telephone Encounter (Signed)
Attempted to call patient regarding upcoming cardiac CT appointment. °Left message on voicemail with name and callback number °Saadiq Poche RN Navigator Cardiac Imaging °[facility] °[phone] Office °[phone] Cell ° °

## 2021-01-08 ENCOUNTER — Other Ambulatory Visit: Payer: Self-pay

## 2021-01-08 ENCOUNTER — Ambulatory Visit (HOSPITAL_COMMUNITY)
Admission: RE | Admit: 2021-01-08 | Discharge: 2021-01-08 | Disposition: A | Discharge status: Home / Self Care | Payer: Medicare Other | Source: Ambulatory Visit | Attending: Internal Medicine | Admitting: Internal Medicine

## 2021-01-08 DIAGNOSIS — I4891 Unspecified atrial fibrillation: Secondary | ICD-10-CM | POA: Insufficient documentation

## 2021-01-08 MED ORDER — IOHEXOL 350 MG/ML SOLN
100.0000 mL | Freq: Once | INTRAVENOUS | Status: AC | PRN
  Administered 2021-01-08: 80 mL via INTRAVENOUS

## 2021-01-12 ENCOUNTER — Other Ambulatory Visit (HOSPITAL_COMMUNITY)
Admission: RE | Admit: 2021-01-12 | Discharge: 2021-01-12 | Disposition: A | Discharge status: Home / Self Care | Payer: Medicare Other | Source: Ambulatory Visit | Attending: Internal Medicine | Admitting: Internal Medicine

## 2021-01-12 DIAGNOSIS — Z20822 Contact with and (suspected) exposure to covid-19: Secondary | ICD-10-CM | POA: Diagnosis not present

## 2021-01-12 DIAGNOSIS — Z01812 Encounter for preprocedural laboratory examination: Secondary | ICD-10-CM | POA: Diagnosis not present

## 2021-01-12 NOTE — Pre-Procedure Instructions (Signed)
Instructed patient on the following items: Arrival time 0530 Nothing to eat or drink after midnight No meds AM of procedure Responsible person to drive you home and stay with you for 24 hrs  Have you missed any doses of anti-coagulant Eliquis- hasn't missed any doses    

## 2021-01-13 ENCOUNTER — Ambulatory Visit (HOSPITAL_COMMUNITY)
Admission: RE | Admit: 2021-01-13 | Discharge: 2021-01-13 | Disposition: A | Discharge status: Home / Self Care | Payer: Medicare Other | Source: Ambulatory Visit | Attending: Internal Medicine | Admitting: Internal Medicine

## 2021-01-13 ENCOUNTER — Ambulatory Visit (HOSPITAL_COMMUNITY)
Admission: RE | Disposition: A | Discharge status: Home / Self Care | Payer: Self-pay | Source: Ambulatory Visit | Attending: Internal Medicine

## 2021-01-13 ENCOUNTER — Ambulatory Visit (HOSPITAL_COMMUNITY): Payer: Medicare Other | Admitting: Certified Registered Nurse Anesthetist

## 2021-01-13 ENCOUNTER — Other Ambulatory Visit: Payer: Self-pay

## 2021-01-13 ENCOUNTER — Encounter (HOSPITAL_COMMUNITY): Payer: Self-pay | Admitting: Internal Medicine

## 2021-01-13 DIAGNOSIS — Z79899 Other long term (current) drug therapy: Secondary | ICD-10-CM | POA: Insufficient documentation

## 2021-01-13 DIAGNOSIS — Z90710 Acquired absence of both cervix and uterus: Secondary | ICD-10-CM | POA: Diagnosis not present

## 2021-01-13 DIAGNOSIS — Z8249 Family history of ischemic heart disease and other diseases of the circulatory system: Secondary | ICD-10-CM | POA: Insufficient documentation

## 2021-01-13 DIAGNOSIS — Z7901 Long term (current) use of anticoagulants: Secondary | ICD-10-CM | POA: Insufficient documentation

## 2021-01-13 DIAGNOSIS — I4819 Other persistent atrial fibrillation: Secondary | ICD-10-CM | POA: Diagnosis not present

## 2021-01-13 DIAGNOSIS — K219 Gastro-esophageal reflux disease without esophagitis: Secondary | ICD-10-CM | POA: Diagnosis not present

## 2021-01-13 DIAGNOSIS — I4892 Unspecified atrial flutter: Secondary | ICD-10-CM | POA: Insufficient documentation

## 2021-01-13 DIAGNOSIS — I484 Atypical atrial flutter: Secondary | ICD-10-CM | POA: Diagnosis not present

## 2021-01-13 DIAGNOSIS — E785 Hyperlipidemia, unspecified: Secondary | ICD-10-CM | POA: Diagnosis not present

## 2021-01-13 HISTORY — PX: ATRIAL FIBRILLATION ABLATION: EP1191

## 2021-01-13 LAB — SARS CORONAVIRUS 2 (TAT 6-24 HRS): SARS Coronavirus 2: NEGATIVE

## 2021-01-13 SURGERY — ATRIAL FIBRILLATION ABLATION
Anesthesia: General

## 2021-01-13 MED ORDER — FLUMAZENIL 1 MG/10ML IV SOLN
INTRAVENOUS | Status: AC
  Filled 2021-01-13: qty 10

## 2021-01-13 MED ORDER — HEPARIN (PORCINE) IN NACL 1000-0.9 UT/500ML-% IV SOLN
INTRAVENOUS | Status: AC
  Filled 2021-01-13: qty 500

## 2021-01-13 MED ORDER — FENTANYL CITRATE (PF) 100 MCG/2ML IJ SOLN: 25.0000 ug | INTRAMUSCULAR | Status: DC | PRN

## 2021-01-13 MED ORDER — PHENYLEPHRINE HCL-NACL 10-0.9 MG/250ML-% IV SOLN
INTRAVENOUS | Status: DC | PRN
  Administered 2021-01-13: 40 ug/min via INTRAVENOUS

## 2021-01-13 MED ORDER — PROPOFOL 10 MG/ML IV BOLUS
INTRAVENOUS | Status: DC | PRN
  Administered 2021-01-13: 100 mg via INTRAVENOUS
  Administered 2021-01-13 (×2): 50 mg via INTRAVENOUS

## 2021-01-13 MED ORDER — LIDOCAINE 2% (20 MG/ML) 5 ML SYRINGE
INTRAMUSCULAR | Status: DC | PRN
  Administered 2021-01-13: 30 mg via INTRAVENOUS

## 2021-01-13 MED ORDER — HEPARIN SODIUM (PORCINE) 1000 UNIT/ML IJ SOLN
INTRAMUSCULAR | Status: AC
  Filled 2021-01-13: qty 2

## 2021-01-13 MED ORDER — ONDANSETRON HCL 4 MG/2ML IJ SOLN: 4.0000 mg | Freq: Four times a day (QID) | INTRAMUSCULAR | Status: DC | PRN

## 2021-01-13 MED ORDER — ACETAMINOPHEN 325 MG PO TABS: 650.0000 mg | ORAL_TABLET | ORAL | Status: DC | PRN

## 2021-01-13 MED ORDER — HEPARIN SODIUM (PORCINE) 1000 UNIT/ML IJ SOLN
INTRAMUSCULAR | Status: DC | PRN
  Administered 2021-01-13: 1000 [IU] via INTRAVENOUS
  Administered 2021-01-13: 15000 [IU] via INTRAVENOUS

## 2021-01-13 MED ORDER — SODIUM CHLORIDE 0.9% FLUSH: 3.0000 mL | INTRAVENOUS | Status: DC | PRN

## 2021-01-13 MED ORDER — ONDANSETRON HCL 4 MG/2ML IJ SOLN
INTRAMUSCULAR | Status: DC | PRN
  Administered 2021-01-13: 4 mg via INTRAVENOUS

## 2021-01-13 MED ORDER — ROCURONIUM BROMIDE 10 MG/ML (PF) SYRINGE
PREFILLED_SYRINGE | INTRAVENOUS | Status: DC | PRN
  Administered 2021-01-13: 50 mg via INTRAVENOUS

## 2021-01-13 MED ORDER — HEPARIN (PORCINE) IN NACL 1000-0.9 UT/500ML-% IV SOLN
INTRAVENOUS | Status: DC | PRN
  Administered 2021-01-13: 500 mL

## 2021-01-13 MED ORDER — PROTAMINE SULFATE 10 MG/ML IV SOLN
INTRAVENOUS | Status: DC | PRN
  Administered 2021-01-13: 40 mg via INTRAVENOUS

## 2021-01-13 MED ORDER — OXYCODONE HCL 5 MG/5ML PO SOLN
5.0000 mg | Freq: Once | ORAL | Status: DC | PRN
  Filled 2021-01-13: qty 5

## 2021-01-13 MED ORDER — SODIUM CHLORIDE 0.9 % IV SOLN: INTRAVENOUS | Status: DC

## 2021-01-13 MED ORDER — SODIUM CHLORIDE 0.9% FLUSH: 3.0000 mL | Freq: Two times a day (BID) | INTRAVENOUS | Status: DC

## 2021-01-13 MED ORDER — LACTATED RINGERS IV SOLN: INTRAVENOUS | Status: DC | PRN

## 2021-01-13 MED ORDER — SUGAMMADEX SODIUM 200 MG/2ML IV SOLN
INTRAVENOUS | Status: DC | PRN
  Administered 2021-01-13: 200 mg via INTRAVENOUS

## 2021-01-13 MED ORDER — DEXAMETHASONE SODIUM PHOSPHATE 10 MG/ML IJ SOLN
INTRAMUSCULAR | Status: DC | PRN
  Administered 2021-01-13: 10 mg via INTRAVENOUS

## 2021-01-13 MED ORDER — HEPARIN SODIUM (PORCINE) 1000 UNIT/ML IJ SOLN
INTRAMUSCULAR | Status: DC | PRN
  Administered 2021-01-13 (×2): 2000 [IU] via INTRAVENOUS

## 2021-01-13 MED ORDER — OXYCODONE HCL 5 MG PO TABS
5.0000 mg | ORAL_TABLET | Freq: Once | ORAL | Status: DC | PRN
Start: 2021-01-13 — End: 2021-01-13

## 2021-01-13 MED ORDER — HYDROCODONE-ACETAMINOPHEN 5-325 MG PO TABS: 1.0000 | ORAL_TABLET | ORAL | Status: DC | PRN

## 2021-01-13 MED ORDER — MIDAZOLAM HCL 2 MG/2ML IJ SOLN
INTRAMUSCULAR | Status: DC | PRN
  Administered 2021-01-13: 2 mg via INTRAVENOUS

## 2021-01-13 MED ORDER — SODIUM CHLORIDE 0.9 % IV SOLN: 250.0000 mL | INTRAVENOUS | Status: DC | PRN

## 2021-01-13 MED ORDER — APIXABAN 5 MG PO TABS
5.0000 mg | ORAL_TABLET | Freq: Once | ORAL | Status: AC
  Administered 2021-01-13: 5 mg via ORAL
  Filled 2021-01-13 (×2): qty 1

## 2021-01-13 MED ORDER — FENTANYL CITRATE (PF) 250 MCG/5ML IJ SOLN
INTRAMUSCULAR | Status: DC | PRN
  Administered 2021-01-13 (×2): 50 ug via INTRAVENOUS

## 2021-01-13 MED ORDER — ONDANSETRON HCL 4 MG/2ML IJ SOLN: 4.0000 mg | Freq: Once | INTRAMUSCULAR | Status: DC | PRN

## 2021-01-13 MED ORDER — PANTOPRAZOLE SODIUM 40 MG PO TBEC: 40.0000 mg | DELAYED_RELEASE_TABLET | Freq: Every day | ORAL | 0 refills | Status: DC

## 2021-01-13 SURGICAL SUPPLY — 18 items
BLANKET WARM UNDERBOD FULL ACC (MISCELLANEOUS) ×2 IMPLANT
CATH MAPPNG PENTARAY F 2-6-2MM (CATHETERS) ×1 IMPLANT
CATH SMTCH THERMOCOOL SF DF (CATHETERS) ×2 IMPLANT
CATH SOUNDSTAR ECO 8FR (CATHETERS) ×2 IMPLANT
CATH WEBSTER BI DIR CS D-F CRV (CATHETERS) ×2 IMPLANT
CLOSURE PERCLOSE PROSTYLE (VASCULAR PRODUCTS) ×6 IMPLANT
COVER SWIFTLINK CONNECTOR (BAG) ×2 IMPLANT
NEEDLE BAYLIS TRANSSEPTAL 71CM (NEEDLE) ×2 IMPLANT
PACK EP LATEX FREE (CUSTOM PROCEDURE TRAY) ×2
PACK EP LF (CUSTOM PROCEDURE TRAY) ×1 IMPLANT
PAD PRO RADIOLUCENT 2001M-C (PAD) ×2 IMPLANT
PATCH CARTO3 (PAD) ×2 IMPLANT
PENTARAY F 2-6-2MM (CATHETERS) ×2
SHEATH PINNACLE 7F 10CM (SHEATH) ×6 IMPLANT
SHEATH PINNACLE 9F 10CM (SHEATH) ×2 IMPLANT
SHEATH PROBE COVER 6X72 (BAG) ×2 IMPLANT
SHEATH SWARTZ TS SL2 63CM 8.5F (SHEATH) ×2 IMPLANT
TUBING SMART ABLATE COOLFLOW (TUBING) ×2 IMPLANT

## 2021-01-13 NOTE — Progress Notes (Signed)
Client up and walked and tolerated well; right groin stable, no bleeding or hematoma 

## 2021-01-13 NOTE — Discharge Instructions (Signed)
Cardiac Ablation, Care After This sheet gives you information about how to care for yourself after your procedure. Your health care provider may also give you more specific instructions. If you have problems or questions, contact your health care provider. What can I expect after the procedure? After the procedure, it is common to have:  Bruising around the insertion site.  Tenderness around the insertion site.  Skipped heartbeats.  Tiredness (fatigue). Follow these instructions at home: Insertion site care  Follow instructions from your health care provider about how to take care of your insertion site. Make sure you: ? Wash your hands with soap and water for at least 20 seconds before and after you change your bandage (dressing). If soap and water are not available, use hand sanitizer. ? Change your dressing as told by your health care provider. ? Leave stitches (sutures), skin glue, or adhesive strips in place. These skin closures may need to stay in place for up to 2 weeks. If adhesive strip edges start to loosen and curl up, you may trim the loose edges. Do not remove adhesive strips completely unless your health care provider tells you to do that.  Check your insertion site every day for signs of infection. Check for: ? More redness, swelling, or pain. ? Fluid or blood. ? Warmth. ? Pus or a bad smell.  If your insertion site starts to bleed, lie down on your back, apply firm pressure to the area, and contact your health care provider.   Driving  If you were given a sedative during the procedure, it can affect you for several hours. Do not drive or operate machinery until your health care provider says that it is safe.  Ask your health care provider when it is safe for you to drive again after the procedure.   Activity  Avoid activities that take a lot of effort for at least 3 days after your procedure.  Do not lift anything that is heavier than 10 lb (4.5 kg), or the limit  that you are told, until your health care provider says that it is safe.  Return to your normal activities as told by your health care provider. Ask your health care provider what activities are safe for you.   General instructions  Take over-the-counter and prescription medicines only as told by your health care provider.  Do not use any products that contain nicotine or tobacco, such as cigarettes, e-cigarettes, and chewing tobacco. If you need help quitting, ask your health care provider.  Do not take baths, swim, or use a hot tub until your health care provider approves. Ask your health care provider if you may take showers. You may only be allowed to take sponge baths.  Do not drink alcohol for 24 hours after your procedure.  Keep all follow-up visits as told by your health care provider. This is important. Contact a health care provider if you:  Notice these things around the catheter insertion site: ? More redness, swelling, or pain. ? Fluid or blood that stops after applying firm pressure to the area. ? Warmth when you touch the area. ? Pus or a bad smell.  Have a fever.  Are sweating a lot.  Feel nauseous.  Have pain or numbness in the arm or leg closest to your insertion site. Get help right away if:  Your insertion site suddenly swells.  Your insertion site is bleeding and the bleeding does not stop after applying firm pressure to the area.  You   have chest pain or discomfort that spreads to your neck, jaw, or arm.  You have a fast or irregular heartbeat.  You have shortness of breath.  You are dizzy or light-headed and feel the need to lie down. These symptoms may represent a serious problem that is an emergency. Do not wait to see if the symptoms will go away. Get medical help right away. Call your local emergency services (911 in the [place] Do not drive yourself to the hospital. Summary  After the procedure, it is common to have bruising and tenderness at the  insertion site in your groin or your neck.  Check your insertion site every day for more redness, swelling, or pain. These are signs of infection.  Contact a health care provider if you notice any signs of infection. Also, contact a health care provider if you start sweating a lot, feel nauseous, or have pain or numbness in the arm or leg closest to your insertion site.  Get help right away if your puncture site is bleeding and the bleeding does not stop after applying firm pressure to the area. This is a medical emergency. This information is not intended to replace advice given to you by your health care provider. Make sure you discuss any questions you have with your health care provider. Document Revised: 07/09/2019 Document Reviewed: 07/09/2019 Elsevier Patient Education  2021 [employer]  

## 2021-01-13 NOTE — Anesthesia Preprocedure Evaluation (Signed)
Anesthesia Evaluation  Patient identified by MRN, date of birth, ID band Patient awake    Reviewed: Allergy & Precautions, NPO status , Patient's Chart, lab work & pertinent test results  Airway Mallampati: II  TM Distance: >3 FB Neck ROM: Full    Dental  (+) Teeth Intact, Dental Advisory Given   Pulmonary    breath sounds clear to auscultation       Cardiovascular  Rhythm:Irregular Rate:Normal     Neuro/Psych    GI/Hepatic   Endo/Other    Renal/GU      Musculoskeletal   Abdominal   Peds  Hematology   Anesthesia Other Findings   Reproductive/Obstetrics                             Anesthesia Physical Anesthesia Plan  ASA: III  Anesthesia Plan: General   Post-op Pain Management:    Induction: Intravenous  PONV Risk Score and Plan: Ondansetron and Dexamethasone  Airway Management Planned: Oral ETT  Additional Equipment:   Intra-op Plan:   Post-operative Plan: Extubation in OR  Informed Consent: I have reviewed the patients History and Physical, chart, labs and discussed the procedure including the risks, benefits and alternatives for the proposed anesthesia with the patient or authorized representative who has indicated his/her understanding and acceptance.     Dental advisory given  Plan Discussed with: CRNA and Anesthesiologist  Anesthesia Plan Comments:         Anesthesia Quick Evaluation

## 2021-01-13 NOTE — Transfer of Care (Signed)
Immediate Anesthesia Transfer of Care Note  Patient: Lisa Moore  Procedure(s) Performed: ATRIAL FIBRILLATION ABLATION (N/A )  Patient Location: PACU  Anesthesia Type:General  Level of Consciousness: drowsy  Airway & Oxygen Therapy: Patient Spontanous Breathing and Patient connected to nasal cannula oxygen  Post-op Assessment: Report given to RN and Post -op Vital signs reviewed and stable  Post vital signs: Reviewed and stable  Last Vitals:  Vitals Value Taken Time  BP 110/71 01/13/21 1010  Temp    Pulse 67 01/13/21 1011  Resp 15 01/13/21 1011  SpO2 93 % 01/13/21 1011  Vitals shown include unvalidated device data.  Last Pain:  Vitals:   01/13/21 0559  TempSrc:   PainSc: 0-No pain         Complications: No complications documented.

## 2021-01-13 NOTE — Anesthesia Postprocedure Evaluation (Signed)
Anesthesia Post Note  Patient: Lisa Moore  Procedure(s) Performed: ATRIAL FIBRILLATION ABLATION (N/A )     Patient location during evaluation: Cath Lab Anesthesia Type: General Level of consciousness: awake and alert Pain management: pain level controlled Vital Signs Assessment: post-procedure vital signs reviewed and stable Respiratory status: spontaneous breathing, nonlabored ventilation, respiratory function stable and patient connected to nasal cannula oxygen Cardiovascular status: blood pressure returned to baseline and stable Postop Assessment: no apparent nausea or vomiting Anesthetic complications: no   No complications documented.  Last Vitals:  Vitals:   01/13/21 1230 01/13/21 1300  BP: 129/82 127/86  Pulse: 73 75  Resp: 14 14  Temp:    SpO2: 98% 96%    Last Pain:  Vitals:   01/13/21 1040  TempSrc: Temporal  PainSc: 0-No pain                 Keyontay Stolz COKER

## 2021-01-13 NOTE — H&P (Signed)
CC: afib   History of Present Illness: Lisa Moore is a 70 y.o. female who presents today for electrophysiology study and ablation.  She was initially diagnosed with afib 05/20/20 after presenting to PCP.  She was started on eliquis.  She reports fatigue and decreased exercise tolerance with afib.  She has failed medical therapy with flecainide. She was cardioverted 11/07/20.  On return 11/13/20, she was found to have atrial flutter.  Today, she denies symptoms of palpitations, chest pain, shortness of breath, orthopnea, PND, lower extremity edema, claudication, dizziness, presyncope, syncope, bleeding, or neurologic sequela. The patient is tolerating medications without difficulties and is otherwise without complaint today.        Past Medical History:  Diagnosis Date  . Arthritis   . Benign hematuria   . GERD (gastroesophageal reflux disease)   . Migraines    rare  . New onset atrial fibrillation (HCC) 05/21/2020        Past Surgical History:  Procedure Laterality Date  . ABDOMINAL HYSTERECTOMY    . APPENDECTOMY    . CARDIOVERSION N/A 07/07/2020   Procedure: CARDIOVERSION;  Surgeon: Wendall Stade, MD;  Location: [facility] ENDOSCOPY;  Service: Cardiovascular;  Laterality: N/A;  . CARDIOVERSION N/A 11/07/2020   Procedure: CARDIOVERSION;  Surgeon: Wendall Stade, MD;  Location: [facility] ENDOSCOPY;  Service: Cardiovascular;  Laterality: N/A;           Current Outpatient Medications  Medication Sig Dispense Refill  . acetaminophen (TYLENOL) 650 MG CR tablet Take 1,300 mg by mouth every 8 (eight) hours as needed for pain.    Marland Kitchen apixaban (ELIQUIS) 5 MG TABS tablet Take 1 tablet (5 mg total) by mouth 2 (two) times daily. 60 tablet 6  . diltiazem (CARDIZEM CD) 180 MG 24 hr capsule Take 1 capsule (180 mg total) by mouth daily. 30 capsule 6   No current facility-administered medications for this visit.    Allergies:   Patient has no known allergies.   Social History:  The  patient  reports that she has never smoked. She has never used smokeless tobacco. She reports current alcohol use of about 1.0 standard drink of alcohol per week. She reports that she does not use drugs.   Family History:  The patient's  family history includes COPD in her father; Cancer in her father, paternal grandmother, and sister; Diabetes in her father; Hypertension in her mother; Kidney disease in her maternal grandmother.    ROS:  Please see the history of present illness.   All other systems are personally reviewed and negative.    PHYSICAL EXAM: Vitals:   01/13/21 0532  BP: (!) 131/91  Pulse: (!) 138  Temp: 97.8 F (36.6 C)  SpO2: 99%    GEN: Well nourished, well developed, in no acute distress HEENT: normal Neck: no JVD, carotid bruits, or masses Cardiac: RRR  Respiratory:  normal work of breathing GI: soft  MS: no deformity or atrophy Skin: warm and dry  Neuro:  Strength and sensation are intact Psych: euthymic mood, full affect    ASSESSMENT AND PLAN:  1.  Persistent afib/ atrial flutter The patient has symptomatic, recurrent  atrial fibrillation and atrial flutter.  ekg 11/13/20 suggests isthmus dependant atrial flutter  she has failed medical therapy with flecainide. Chads2vasc score is 2.  she is anticoagulated with eliquis .   Risk, benefits, and alternatives to EP study and radiofrequency ablation were also discussed again in detail today. These risks include but are not limited to  stroke, bleeding, vascular damage, tamponade, perforation, damage to the esophagus, lungs, and other structures, pulmonary vein stenosis, worsening renal function, and death. The patient understands these risk and wishes to proceed.    Cardiac CT reviewed with her at length.  She reports compliance with eliquis without interruption.  Hillis Range MD, [facility] 01/13/2021 7:27 AM

## 2021-01-13 NOTE — Anesthesia Procedure Notes (Signed)
Procedure Name: Intubation Date/Time: 01/13/2021 7:58 AM Performed by: Reece Agar, CRNA Pre-anesthesia Checklist: Patient identified, Emergency Drugs available, Suction available and Patient being monitored Patient Re-evaluated:Patient Re-evaluated prior to induction Oxygen Delivery Method: Circle System Utilized Preoxygenation: Pre-oxygenation with 100% oxygen Induction Type: IV induction Ventilation: Mask ventilation without difficulty, Oral airway inserted - appropriate to patient size and Two handed mask ventilation required Laryngoscope Size: Glidescope and 3 Grade View: Grade I Tube type: Oral Number of attempts: 3 Airway Equipment and Method: Stylet and Oral airway Placement Confirmation: ETT inserted through vocal cords under direct vision,  positive ETCO2 and breath sounds checked- equal and bilateral Secured at: 21 cm Tube secured with: Tape Dental Injury: Teeth and Oropharynx as per pre-operative assessment  Difficulty Due To: Difficulty was unanticipated, Difficult Airway- due to anterior larynx and Difficult Airway- due to limited oral opening Future Recommendations: Recommend- induction with short-acting agent, and alternative techniques readily available Comments: Grade IV view with MAC 3 by CRNA, Grade IIIb with Mil 2 by MDA, successful intubation with Glidescope 3

## 2021-01-14 ENCOUNTER — Encounter (HOSPITAL_COMMUNITY): Payer: Self-pay | Admitting: Internal Medicine

## 2021-01-14 LAB — POCT ACTIVATED CLOTTING TIME
Activated Clotting Time: 339 seconds
Activated Clotting Time: 339 seconds

## 2021-01-16 MED FILL — Flumazenil IV Soln 1 MG/10ML (0.1 MG/ML): INTRAVENOUS | Qty: 10 | Status: AC

## 2021-01-19 ENCOUNTER — Telehealth: Payer: Self-pay | Admitting: Internal Medicine

## 2021-01-19 NOTE — Telephone Encounter (Signed)
Returned call to Pt.  Advised NOT to pick up flecainide.  She is no longer taking this medication.  Pt indicates understanding.

## 2021-01-19 NOTE — Telephone Encounter (Signed)
Lisa Moore was in to see Dr. Johney Frame last week, she received a call from [facility] this morning stating she had a script ready for pick up, the name of the new med is Flecainide, she is not sure why she is supposed to be taking it or who prescribed it. She would like a return call.

## 2021-01-22 ENCOUNTER — Ambulatory Visit (HOSPITAL_COMMUNITY): Payer: Medicare Other | Admitting: Physician Assistant

## 2021-02-10 ENCOUNTER — Other Ambulatory Visit: Payer: Self-pay

## 2021-02-10 ENCOUNTER — Ambulatory Visit (HOSPITAL_COMMUNITY)
Admission: RE | Admit: 2021-02-10 | Discharge: 2021-02-10 | Disposition: A | Discharge status: Home / Self Care | Payer: Medicare Other | Source: Ambulatory Visit | Attending: Physician Assistant | Admitting: Physician Assistant

## 2021-02-10 ENCOUNTER — Encounter (HOSPITAL_COMMUNITY): Payer: Self-pay | Admitting: Physician Assistant

## 2021-02-10 VITALS — BP 118/82 | HR 68 | Ht 65.0 in | Wt 216.8 lb

## 2021-02-10 DIAGNOSIS — Z6836 Body mass index (BMI) 36.0-36.9, adult: Secondary | ICD-10-CM | POA: Diagnosis not present

## 2021-02-10 DIAGNOSIS — I4819 Other persistent atrial fibrillation: Secondary | ICD-10-CM | POA: Diagnosis not present

## 2021-02-10 DIAGNOSIS — E669 Obesity, unspecified: Secondary | ICD-10-CM | POA: Insufficient documentation

## 2021-02-10 DIAGNOSIS — I4892 Unspecified atrial flutter: Secondary | ICD-10-CM | POA: Insufficient documentation

## 2021-02-10 DIAGNOSIS — I340 Nonrheumatic mitral (valve) insufficiency: Secondary | ICD-10-CM | POA: Diagnosis not present

## 2021-02-10 DIAGNOSIS — Z7901 Long term (current) use of anticoagulants: Secondary | ICD-10-CM | POA: Insufficient documentation

## 2021-02-10 DIAGNOSIS — Z79899 Other long term (current) drug therapy: Secondary | ICD-10-CM | POA: Diagnosis not present

## 2021-02-10 NOTE — Progress Notes (Signed)
Primary Care Physician: Orland Mustard, MD Primary Cardiologist: Dr Jens Som  Primary Electrophysiologist: Dr Johney Frame Referring Physician: Kriste Basque Dunn/Dr Dillard Cannon Lisa Moore is a 70 y.o. female with a history of persistent atrial fibrillation who presents for follow up in the [facility]. The patient was initially diagnosed with atrial fibrillation 05/20/20 after presenting to her PCP office with migraine symptoms. She was found incidentally to be in afib with RVR and was sent to the ED. She was admitted and started on Eliquis for a CHADS2VASC score of 2 and diltiazem for rate control. She was asymptomatic with her arrhythmia. Echo done during her admission showed normal LVEF, moderate LA dilation, degenerative MV with mild MR. She reports she may have more fatigue since her afib was diagnosed but she is not sure about this. She denies signficant alcohol use but she does admit to snoring and daytime somnolence. Patient is s/p DCCV on 07/07/20. Patient is s/p DCCV on 11/07/20 after starting flecainide but had atrial flutter on follow up.  On follow up today, she is s/p afib and flutter ablation with Dr Johney Frame on 01/13/21. She reports that she had done well since the procedure with improvement in her exercise tolerance. She denies CP, swallowing pain, or groin issues. She denies any bleeding issues on anticoagulation.   Today, she denies symptoms of palpitations, chest pain, shortness of breath, orthopnea, PND, lower extremity edema, dizziness, presyncope, syncope, bleeding, or neurologic sequela. The patient is tolerating medications without difficulties and is otherwise without complaint today.    Atrial Fibrillation Risk Factors:  she does not have symptoms or diagnosis of sleep apnea. Sleep study 11/30/20 negative.  she does not have a history of rheumatic fever. she does not have a history of alcohol use. The patient does not have a history of early familial atrial  fibrillation or other arrhythmias.  she has a BMI of Body mass index is 36.08 kg/m.Marland Kitchen Filed Weights   02/10/21 1330  Weight: 98.3 kg    Family History  Problem Relation Age of Onset  . Hypertension Mother   . Cancer Father   . COPD Father   . Diabetes Father   . Cancer Sister   . Kidney disease Maternal Grandmother   . Cancer Paternal Grandmother      Atrial Fibrillation Management history:  Previous antiarrhythmic drugs: flecainide Previous cardioversions: 07/07/20, 11/07/20 Previous ablations: 01/13/21 CHADS2VASC score: 2 Anticoagulation history: Eliquis   Past Medical History:  Diagnosis Date  . Arthritis   . Benign hematuria   . GERD (gastroesophageal reflux disease)   . Migraines    rare  . Persistent atrial fibrillation (HCC) 05/21/2020  . Typical atrial flutter (HCC) 11/13/2020   Past Surgical History:  Procedure Laterality Date  . ABDOMINAL HYSTERECTOMY    . APPENDECTOMY    . ATRIAL FIBRILLATION ABLATION N/A 01/13/2021   Procedure: ATRIAL FIBRILLATION ABLATION;  Surgeon: Hillis Range, MD;  Location: [facility] INVASIVE CV LAB;  Service: Cardiovascular;  Laterality: N/A;  . CARDIOVERSION N/A 07/07/2020   Procedure: CARDIOVERSION;  Surgeon: Wendall Stade, MD;  Location: [facility] ENDOSCOPY;  Service: Cardiovascular;  Laterality: N/A;  . CARDIOVERSION N/A 11/07/2020   Procedure: CARDIOVERSION;  Surgeon: Wendall Stade, MD;  Location: [facility] ENDOSCOPY;  Service: Cardiovascular;  Laterality: N/A;    Current Outpatient Medications  Medication Sig Dispense Refill  . acetaminophen (TYLENOL) 500 MG tablet Take 1,000 mg by mouth every 6 (six) hours as needed for moderate pain or headache.    Marland Kitchen  apixaban (ELIQUIS) 5 MG TABS tablet Take 1 tablet (5 mg total) by mouth 2 (two) times daily. 60 tablet 6  . diltiazem (CARDIZEM CD) 180 MG 24 hr capsule Take 1 capsule (180 mg total) by mouth daily. 30 capsule 11   No current facility-administered medications for this encounter.    No Known  Allergies  Social History   Socioeconomic History  . Marital status: Widowed    Spouse name: Not on file  . Number of children: Not on file  . Years of education: Not on file  . Highest education level: Not on file  Occupational History  . Not on file  Tobacco Use  . Smoking status: Never Smoker  . Smokeless tobacco: Never Used  Vaping Use  . Vaping Use: Never used  Substance and Sexual Activity  . Alcohol use: Yes    Alcohol/week: 1.0 standard drink    Types: 1 Shots of liquor per week    Comment: occasional beer few times a month, not regularly  . Drug use: No  . Sexual activity: Not Currently  Other Topics Concern  . Not on file  Social History Narrative  . Not on file   Social Determinants of Health   Financial Resource Strain: Not on file  Food Insecurity: Not on file  Transportation Needs: Not on file  Physical Activity: Not on file  Stress: Not on file  Social Connections: Not on file  Intimate Partner Violence: Not on file     ROS- All systems are reviewed and negative except as per the HPI above.  Physical Exam: Vitals:   02/10/21 1330  BP: 118/82  Pulse: 68  Weight: 98.3 kg  Height: 5\' 5"  (1.651 m)    GEN- The patient is a well appearing obese female, alert and oriented x 3 today.   HEENT-head normocephalic, atraumatic, sclera clear, conjunctiva pink, hearing intact, trachea midline. Lungs- Clear to ausculation bilaterally, normal work of breathing Heart- Regular rate and rhythm, no murmurs, rubs or gallops  GI- soft, NT, ND, + BS Extremities- no clubbing, cyanosis, or edema MS- no significant deformity or atrophy Skin- no rash or lesion Psych- euthymic mood, full affect Neuro- strength and sensation are intact   Wt Readings from Last 3 Encounters:  02/10/21 98.3 kg  01/13/21 95.3 kg  11/30/20 95.3 kg    EKG today demonstrates SR Vent. rate 68 BPM PR interval 176 ms QRS duration 74 ms QT/QTcB 408/433 ms  Echo 05/21/20 demonstrated   1. Left ventricular ejection fraction, by estimation, is 55 to 60%. The  left ventricle has normal function. The left ventricle has no regional  wall motion abnormalities. Left ventricular diastolic parameters are  indeterminate.  2. Right ventricular systolic function is normal. The right ventricular  size is normal.  3. Left atrial size was moderately dilated.  4. The mitral valve is degenerative. Mild mitral valve regurgitation. No  evidence of mitral stenosis. Moderate mitral annular calcification.  5. The aortic valve is tricuspid. Aortic valve regurgitation is not  visualized. No aortic stenosis is present.  6. The inferior vena cava is normal in size with greater than 50%  respiratory variability, suggesting right atrial pressure of 3 mmHg.   Epic records are reviewed at length today  CHA2DS2-VASc Score = 2  The patient's score is based upon: CHF History: No HTN History: No Diabetes History: No Stroke History: No Vascular Disease History: No     ASSESSMENT AND PLAN: 1. Persistent Atrial Fibrillation/atrial flutter The patient's  CHA2DS2-VASc score is 2, indicating a 2.2% annual risk of stroke.   S/p afib and flutter ablation 01/13/21 Patient appears to be maintaining SR. Continue Eliquis 5 mg BID with no missed doses for at least 3 months post ablation.  Continue diltiazem 180 mg daily  2. Obesity Body mass index is 36.08 kg/m. Lifestyle modification was discussed and encouraged including regular physical activity and weight reduction.    Follow up with Dr Johney Frame as scheduled.    Jorja Loa PA-C Afib Clinic [facility] [address] [place], [place] [address] [phone] 02/10/2021 1:39 PM

## 2021-02-24 ENCOUNTER — Other Ambulatory Visit: Payer: Self-pay | Admitting: Internal Medicine

## 2021-03-26 ENCOUNTER — Telehealth: Payer: Self-pay

## 2021-03-26 NOTE — Telephone Encounter (Signed)
LVM for pt that Dr Artis Flock has left and to call the office back

## 2021-04-13 ENCOUNTER — Ambulatory Visit (INDEPENDENT_AMBULATORY_CARE_PROVIDER_SITE_OTHER): Payer: Medicare Other | Admitting: Internal Medicine

## 2021-04-13 ENCOUNTER — Other Ambulatory Visit: Payer: Self-pay

## 2021-04-13 VITALS — BP 126/72 | HR 74 | Ht 65.0 in | Wt 206.8 lb

## 2021-04-13 DIAGNOSIS — I4819 Other persistent atrial fibrillation: Secondary | ICD-10-CM

## 2021-04-13 DIAGNOSIS — I4891 Unspecified atrial fibrillation: Secondary | ICD-10-CM | POA: Diagnosis not present

## 2021-04-13 MED ORDER — DILTIAZEM HCL ER COATED BEADS 120 MG PO CP24: 120.0000 mg | ORAL_CAPSULE | Freq: Every day | ORAL | 3 refills | Status: DC

## 2021-04-13 NOTE — Patient Instructions (Addendum)
Medication Instructions:  Stop Eliquis Reduce Diltiazem to 120 mg daily Your physician recommends that you continue on your current medications as directed. Please refer to the Current Medication list given to you today.  Labwork: None ordered.  Testing/Procedures: None ordered.  Follow-Up: Your physician wants you to follow-up in: 10/19/21 at 10:30 am with Hillis Range, MD   Any Other Special Instructions Will Be Listed Below (If Applicable).  If you need a refill on your cardiac medications before your next appointment, please call your pharmacy.

## 2021-04-13 NOTE — Progress Notes (Signed)
   PCP: Pcp, No Primary Cardiologist: Dr Lisa Moore is a 70 y.o. female who presents today for routine electrophysiology followup.  Since his recent afib ablation, the patient reports doing very well.  she denies procedure related complications and is pleased with the results of the procedure.  Today, she denies symptoms of palpitations, chest pain, shortness of breath,  lower extremity edema, dizziness, presyncope, or syncope.  The patient is otherwise without complaint today.   Past Medical History:  Diagnosis Date   Arthritis    Benign hematuria    GERD (gastroesophageal reflux disease)    Migraines    rare   Persistent atrial fibrillation (HCC) 05/21/2020   Typical atrial flutter (HCC) 11/13/2020   Past Surgical History:  Procedure Laterality Date   ABDOMINAL HYSTERECTOMY     APPENDECTOMY     ATRIAL FIBRILLATION ABLATION N/A 01/13/2021   Procedure: ATRIAL FIBRILLATION ABLATION;  Surgeon: Hillis Range, MD;  Location: [facility];  Service: Cardiovascular;  Laterality: N/A;   CARDIOVERSION N/A 07/07/2020   Procedure: CARDIOVERSION;  Surgeon: Wendall Stade, MD;  Location: [facility] ENDOSCOPY;  Service: Cardiovascular;  Laterality: N/A;   CARDIOVERSION N/A 11/07/2020   Procedure: CARDIOVERSION;  Surgeon: Wendall Stade, MD;  Location: [facility] ENDOSCOPY;  Service: Cardiovascular;  Laterality: N/A;    ROS- all systems are personally reviewed and negatives except as per HPI above  Current Outpatient Medications  Medication Sig Dispense Refill   acetaminophen (TYLENOL) 500 MG tablet Take 1,000 mg by mouth every 6 (six) hours as needed for moderate pain or headache.     apixaban (ELIQUIS) 5 MG TABS tablet Take 1 tablet (5 mg total) by mouth 2 (two) times daily. 60 tablet 6   diltiazem (CARDIZEM CD) 180 MG 24 hr capsule Take 1 capsule (180 mg total) by mouth daily. 30 capsule 11   No current facility-administered medications for this visit.    Physical Exam: Vitals:    04/13/21 1407  BP: 126/72  Pulse: 74  SpO2: 96%  Weight: 206 lb 12.8 oz (93.8 kg)  Height: 5\' 5"  (1.651 m)    GEN- The patient is well appearing, alert and oriented x 3 today.   Head- normocephalic, atraumatic Eyes-  Sclera clear, conjunctiva pink Ears- hearing intact Oropharynx- clear Lungs- Clear to ausculation bilaterally, normal work of breathing Heart- Regular rate and rhythm, no murmurs, rubs or gallops, PMI not laterally displaced GI- soft, NT, ND, + BS Extremities- no clubbing, cyanosis, or edema  EKG tracing ordered today is personally reviewed and shows sinus  Assessment and Plan:  1. Persistent atrial fibrillation and atrial flutter Doing well s/p PVI and CTI ablation chads2vasc score is 2.  She wishes to stop eliquis.  We discussed pros and cons at length.   Based on guideline update 2019,  she has opted to stop eliquis at this time.  She will resume if AF returns. Reduce diltiazem to 120mg  daily today.  Hopefully we can stop diltiazem if no AF on return  2. Obesity Body mass index is 34.41 kg/m. Lifestyle modification is advised   Return to see me in 6 months  2020 MD, [facility] 04/13/2021 2:13 PM

## 2021-04-28 ENCOUNTER — Other Ambulatory Visit (HOSPITAL_COMMUNITY): Payer: Self-pay

## 2021-04-28 MED ORDER — APIXABAN 5 MG PO TABS: 5.0000 mg | ORAL_TABLET | Freq: Two times a day (BID) | ORAL | 0 refills | Status: DC

## 2021-05-28 DIAGNOSIS — Z23 Encounter for immunization: Secondary | ICD-10-CM | POA: Diagnosis not present

## 2021-06-30 DIAGNOSIS — R2689 Other abnormalities of gait and mobility: Secondary | ICD-10-CM | POA: Diagnosis not present

## 2021-07-07 ENCOUNTER — Ambulatory Visit: Payer: Medicare Other | Attending: Otolaryngology | Admitting: Physical Therapy

## 2021-07-07 ENCOUNTER — Other Ambulatory Visit: Payer: Self-pay

## 2021-07-07 VITALS — BP 130/86 | HR 75

## 2021-07-07 DIAGNOSIS — R2681 Unsteadiness on feet: Secondary | ICD-10-CM | POA: Insufficient documentation

## 2021-07-07 DIAGNOSIS — R262 Difficulty in walking, not elsewhere classified: Secondary | ICD-10-CM | POA: Insufficient documentation

## 2021-07-07 DIAGNOSIS — R296 Repeated falls: Secondary | ICD-10-CM | POA: Insufficient documentation

## 2021-07-08 NOTE — Therapy (Signed)
[facility] [address] [place], [place], [address] Phone: [phone]   Fax:  [phone]  Physical Therapy Evaluation  Patient Details  Name: Lisa Moore MRN: [id number] Date of Birth: 05/31/1951 Referring Provider (PT): Doran Heater, Darliss Ridgel, MD   Encounter Date: 07/07/2021   PT End of Session - 07/08/21 1043     Visit Number 1    Number of Visits 17    Date for PT Re-Evaluation 09/06/21    Authorization Type Medicare    Progress Note Due on Visit 10    PT Start Time [phone]   arrived late; unable to find clinic   PT Stop Time 0938    PT Time Calculation (min) 45 min    Activity Tolerance Patient tolerated treatment well    Behavior During Therapy [facility] for tasks assessed/performed;Anxious             Past Medical History:  Diagnosis Date   Arthritis    Benign hematuria    GERD (gastroesophageal reflux disease)    Migraines    rare   Persistent atrial fibrillation (HCC) 05/21/2020   Typical atrial flutter (HCC) 11/13/2020    Past Surgical History:  Procedure Laterality Date   ABDOMINAL HYSTERECTOMY     APPENDECTOMY     ATRIAL FIBRILLATION ABLATION N/A 01/13/2021   Procedure: ATRIAL FIBRILLATION ABLATION;  Surgeon: Hillis Range, MD;  Location: [facility] INVASIVE CV LAB;  Service: Cardiovascular;  Laterality: N/A;   CARDIOVERSION N/A 07/07/2020   Procedure: CARDIOVERSION;  Surgeon: Wendall Stade, MD;  Location: [facility] ENDOSCOPY;  Service: Cardiovascular;  Laterality: N/A;   CARDIOVERSION N/A 11/07/2020   Procedure: CARDIOVERSION;  Surgeon: Wendall Stade, MD;  Location: [facility] ENDOSCOPY;  Service: Cardiovascular;  Laterality: N/A;    Vitals:   07/07/21 0858  BP: 130/86  Pulse: 75      Subjective Assessment - 07/07/21 0859     Subjective Balance issues began about 2 months ago; became more noticeable and continues to get worse.  Went to [facility] a couple weeks ago and really noticed how off balance she  was.  Pt first noticed it when walking across a parking lot - when in an open space she feels very fearful of falling.  Feels like she has changes in depth perception; has difficulty with curbs.  Feels safe at home in familiar spaces. Also has difficulty with grocery stores and has to hold on to cart for stability.  Had one fall outside - 1 month ago - felt like she was going backwards and forced herself forwards on hands and knees - wasn't able to get up, had to scoot around on her butt to the back of the house, into her house and then pulled to stand.  Last eye exam was stable.    Pertinent History OA, Migraines, a-fib with cardioversion - pt elected to stop Eliquis    Limitations Standing;Walking;House hold activities    Diagnostic tests MRI in 2021: negative brain MRI for age, no acute abnormality    Patient Stated Goals To remain independent, prevent falls, be able to garden, to be able to get off the floor independently.    Currently in Pain? Yes   OA in low back               [facility] PT Assessment - 07/07/21 0907       Assessment   Medical Diagnosis Balance Disorder    Referring Provider (PT) Doran Heater Darliss Ridgel, MD  Onset Date/Surgical Date 07/03/21    Prior Therapy none      Precautions   Precautions Other (comment);Fall    Precaution Comments OA, Migraines, a-fib with cardioversion - pt elected to stop Eliquis      Balance Screen   Has the patient fallen in the past 6 months Yes    How many times? 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, walking 2-3x/week but doesn't feel comfortable doing that now       Observation/Other Assessments   Focus on Therapeutic Outcomes (FOTO)  Not assessed      Sensation   Light Touch Appears Intact      Coordination   Gross Motor Movements are Fluid and Coordinated Yes    Finger Nose Finger Test [facility]    Heel Shin Test WFL      ROM / Strength   AROM / PROM / Strength Strength      Strength   Overall Strength Within functional limits for tasks performed      Ambulation/Gait   Ambulation/Gait Yes    Ambulation/Gait Assistance 5: Supervision    Ambulation Distance (Feet) 230 Feet    Assistive device None    Gait Pattern Step-through pattern;Decreased step length - right;Decreased step length - left;Decreased stride length;Shuffle;Decreased trunk rotation    Ambulation Surface Level;Indoor    Gait Comments Pt reports she has multiple canes at home from her husband but she is "too independent" to use a cane; discussed use of a walking stick/trekking pole for stability      Standardized Balance Assessment   Standardized Balance Assessment Five Times Sit to Stand    Five times sit to stand comments  11 sit <> stand in 30 seconds - WNL      Functional Gait  Assessment   Gait assessed  Yes    Gait Level Surface Cannot walk 20 ft without assistance, severe gait deviations or imbalance deviates greater than 15 in outside of the 12 in walkway width or reaches and touches the wall    Change in Gait Speed Cannot change speeds, deviates greater than 15 in outside 12 in walkway width, or loses balance and has to reach for wall or be caught.    Gait with Horizontal Head Turns Performs head turns with moderate changes in gait velocity, slows down, deviates 10-15 in outside 12 in walkway width but recovers, can continue to walk.    Gait with Vertical Head Turns Performs task with severe disruption of gait (eg, staggers 15 in outside 12 in walkway width, loses balance, stops, reaches for wall).    Gait and Pivot Turn Pivot turns safely in greater than 3 sec and stops  with no loss of balance, or pivot turns safely within 3 sec and stops with mild imbalance, requires small steps to catch balance.    Step Over Obstacle Cannot perform without assistance.    Gait with Narrow Base of Support Ambulates less than 4 steps heel to toe or cannot perform without  assistance.    Gait with Eyes Closed Cannot walk 20 ft without assistance, severe gait deviations or imbalance, deviates greater than 15 in outside 12 in walkway width or will not attempt task.    Ambulating Backwards Cannot walk 20 ft without assistance, severe gait deviations or imbalance, deviates greater than 15 in outside 12 in walkway width or will not attempt task.    Steps Two feet to a stair, must use rail.    Total Score 3    FGA comment: 3/30                Vestibular Assessment - 07/07/21 0917       Balancemaster   Balancemaster Comment M-CTSIB: 30 seconds condition 1 with moderate sway; unable to perform condition 2 or 4, condition 3 able to do 10-15 seconds at a time                Objective measurements completed on examination: See above findings.                PT Education - 07/08/21 1037     Education Details Clinical findings, PT POC and goals    Person(s) Educated Patient    Methods Explanation    Comprehension Verbalized understanding              PT Short Term Goals - 07/08/21 1105       PT SHORT TERM GOAL #1   Title Pt will initiate balance HEP and will trial walking with walking stick to be able to return to walking program    Time 4    Period Weeks    Status New    Target Date 08/07/21      PT SHORT TERM GOAL #2   Title Pt will increase # of repetitions on 30 second sit to stand to 14 to indicate increased functional LE strength    Baseline 11    Time 4    Period Weeks    Status New    Target Date 08/07/21      PT SHORT TERM GOAL #3   Title Pt will increase FGA by 5 points to indicate decreased falls risk with dynamic gait.     Baseline 3/30    Time 4    Period Weeks    Status New    Target Date 08/07/21      PT SHORT TERM GOAL #4   Title Pt will demonstrate improved sensory integration by performing MCTSIB: EO conditions for 30 seconds with minimal sway and EC conditions x 15 seconds with close supervision    Time 4    Period Weeks    Status New    Target Date 08/07/21      PT SHORT TERM GOAL #5   Title Pt will demonstrate ability to perform floor <> stand transfer with use of UE and min A x 2 reps    Time 4    Period Weeks    Status New    Target Date 08/07/21               PT Long Term Goals - 07/08/21 1110       PT LONG TERM GOAL #1   Title Pt will demonstrate independence with final HEP, will return to walking program 2-3x/week with LRAD, and will return to chair yoga at [facility]    Time 8    Period Weeks    Status New    Target Date 09/06/21  PT LONG TERM GOAL #2   Title Pt will demonstrate ability to stand from floor with use of UE x 3 reps MOD I    Time 8    Period Weeks    Status New    Target Date 09/06/21      PT LONG TERM GOAL #3   Title Pt will increase FGA score to >/= 19/30 to indicate decreased falls risk    Time 8    Period Weeks    Status New    Target Date 09/06/21      PT LONG TERM GOAL #4   Title Pt will perform MCTSIB conditions with EC x 20 seconds with supervision to improve safety/balance with washing hair and ambulating in low light conditions    Time 8    Period Weeks    Status New    Target Date 09/06/21      PT LONG TERM GOAL #5   Title Pt will ambulate x 1000 over variety of surfaces, up/down curb, and across parking lot with LRAD, MOD I and perform visual scanning for safety without evidence of imbalance    Time 8    Period Weeks    Status New    Target Date 09/06/21                    Plan - 07/08/21 1045     Clinical Impression Statement Pt is a 70 year old female referred to Neuro OPPT for evaluation of balance impairments after  a fall.  Pt's PMH is significant for the following: OA, Migraines, a-fib with cardioversion and fall. The following deficits were noted during pt's exam: disequilibrium, impaired sensory integration, impaired standing balance, overuse of hip strategy to maintain static balance, and gait abnormalities.  Pt's FGA, Modified CTSIB, and 30 second sit to stand scores indicate pt is at high risk for falls. Pt lives alone and would benefit from skilled PT to address these impairments and functional limitations to maximize functional mobility independence and reduce falls risk.    Personal Factors and Comorbidities Comorbidity 2;Past/Current Experience;Social Background    Examination-Activity Limitations Bend;Locomotion Level;Stairs;Stand;Squat;Transfers    Examination-Participation Restrictions Cleaning;Community Activity;Yard Work    Conservation officer, historic buildings Evolving/Moderate complexity    Clinical Decision Making Moderate    Rehab Potential Good    PT Frequency 2x / week    PT Duration 8 weeks    PT Treatment/Interventions ADLs/Self Care Home Management;Aquatic Therapy;Canalith Repostioning;DME Instruction;Gait training;Stair training;Functional mobility training;Therapeutic activities;Therapeutic exercise;Balance training;Neuromuscular re-education;Patient/family education;Vestibular    PT Next Visit Plan Monitor HR with exertion.  Completed vestibular assessment: appears to be more disequilibrium and anxiety around falling.  Initiate HEP focusing on balance reactions, static stability with EC, compliant surfaces, gait training.   Transfers from the floor training.  Walking program with a walking stick/trekking pole.  Discuss aquatic therapy.    Recommended Other Services Aquatic therapy?    Consulted and Agree with Plan of Care Patient             Patient will benefit from skilled therapeutic intervention in order to improve the following deficits and impairments:  Abnormal gait, Decreased  balance, Difficulty walking, Impaired perceived functional ability, Other (comment) (disequilibrium)  Visit Diagnosis: Repeated falls  Unsteadiness on feet  Difficulty in walking, not elsewhere classified     Problem List Patient Active Problem List   Diagnosis Date Noted   Atypical atrial flutter (HCC) 11/13/2020   Persistent atrial fibrillation (HCC) 06/19/2020  Atrial fibrillation with RVR (HCC) 05/21/2020   Ataxia 05/21/2020   Hyperlipidemia 04/03/2018   GERD (gastroesophageal reflux disease) 03/31/2018   Dierdre Highman, PT, DPT 07/08/21    11:15 AM  [facility] [address] [place], [place], [address] Phone: [phone]   Fax:  [phone]  Name: Keni Wafer Forsman MRN: [id number] Date of Birth: 02-Nov-1950

## 2021-07-15 ENCOUNTER — Ambulatory Visit: Payer: Medicare Other | Attending: Otolaryngology | Admitting: Physical Therapy

## 2021-07-15 ENCOUNTER — Other Ambulatory Visit: Payer: Self-pay

## 2021-07-15 DIAGNOSIS — R296 Repeated falls: Secondary | ICD-10-CM | POA: Insufficient documentation

## 2021-07-15 DIAGNOSIS — R2681 Unsteadiness on feet: Secondary | ICD-10-CM | POA: Diagnosis not present

## 2021-07-15 DIAGNOSIS — R262 Difficulty in walking, not elsewhere classified: Secondary | ICD-10-CM | POA: Diagnosis not present

## 2021-07-15 NOTE — Patient Instructions (Addendum)
   Feet Apart (Compliant Surface) Head Motion - Eyes Closed; also gave feet apart EO; feet together EO with head turns; standing with EC - NO HEAD TURNS    Stand on compliant surface: __pillows______ with feet shoulder width apart. Close eyes and move head slowly, up and down. Repeat _1__ times per session. Do __1__ sessions per day.  HEAD TURNS HORIZONTAL & VERTICAL 5 REPS IN POSITIONS 1-3; NO HEAD TURNS WITH FEET TOGETHER WITH EYES CLOSED    Marching In-Place - add head turns side to side and up/down as you feel comfortable - hold to counter as needed for safety    Standing straight, alternate bringing knees toward trunk. Arms swing alternately. Do _10__ sets. Do _1__ times per day.      Gaze Stabilization: Tip Card  1.Target must remain in focus, not blurry, and appear stationary while head is in motion. 2.Perform exercises with small head movements (45 to either side of midline). 3.Increase speed of head motion so long as target is in focus. 4.If you wear eyeglasses, be sure you can see target through lens (therapist will give specific instructions for bifocal / progressive lenses). 5.These exercises may provoke dizziness or nausea. Work through these symptoms. If too dizzy, slow head movement slightly. Rest between each exercise. 6.Exercises demand concentration; avoid distractions. 7.For safety, perform standing exercises close to a counter, wall, corner, or next to someone.     Gaze Stabilization: Standing Feet Apart    Feet shoulder width apart, keeping eyes on target on wall __5__ feet away, tilt head down 15-30 and move head side to side for __60__ seconds. Repeat while moving head up and down for _60___ seconds. Do __3__ sessions per day. Repeat using target on pattern background.  Copyright  VHI. All rights reserved.

## 2021-07-16 NOTE — Therapy (Signed)
[facility] [address] [place], [place], [address] Phone: [phone]   Fax:  [phone]  Physical Therapy Treatment  Patient Details  Name: Lisa Moore MRN: [id number] Date of Birth: 02-10-1951 Referring Provider (PT): Blenda Nicely, Henderson Cloud, MD   Encounter Date: 07/15/2021   PT End of Session - 07/16/21 0637     Visit Number 2    Number of Visits 17    Date for PT Re-Evaluation 09/06/21    Authorization Type Medicare    Progress Note Due on Visit 10    PT Start Time 0932    PT Stop Time 1016    PT Time Calculation (min) 44 min    Activity Tolerance Patient tolerated treatment well    Behavior During Therapy [facility] for tasks assessed/performed;Anxious             Past Medical History:  Diagnosis Date   Arthritis    Benign hematuria    GERD (gastroesophageal reflux disease)    Migraines    rare   Persistent atrial fibrillation (HCC) 05/21/2020   Typical atrial flutter ([place]) 11/13/2020    Past Surgical History:  Procedure Laterality Date   ABDOMINAL HYSTERECTOMY     APPENDECTOMY     ATRIAL FIBRILLATION ABLATION N/A 01/13/2021   Procedure: ATRIAL FIBRILLATION ABLATION;  Surgeon: Thompson Grayer, MD;  Location: [place] [facility];  Service: Cardiovascular;  Laterality: N/A;   CARDIOVERSION N/A 07/07/2020   Procedure: CARDIOVERSION;  Surgeon: Josue Hector, MD;  Location: [facility] ENDOSCOPY;  Service: Cardiovascular;  Laterality: N/A;   CARDIOVERSION N/A 11/07/2020   Procedure: CARDIOVERSION;  Surgeon: Josue Hector, MD;  Location: [facility] ENDOSCOPY;  Service: Cardiovascular;  Laterality: N/A;    There were no vitals filed for this visit.   Subjective Assessment - 07/15/21 0935     Subjective Pt reports she has difficulty maintaining balance in unfamiliar places but does not seem to have as much trouble at home.  Pt reports the problem started about 2 months ago and she doesn't know what is going on - says she  is used to be very independent - doing yardwork - and this is really bothering her    Pertinent History OA, Migraines, a-fib with cardioversion - pt elected to stop Eliquis    Limitations Standing;Walking;House hold activities    Diagnostic tests MRI in 2021: negative brain MRI for age, no acute abnormality    Patient Stated Goals To remain independent, prevent falls, be able to garden, to be able to get off the floor independently.    Currently in Pain? Yes    Pain Score 3     Pain Location Hip    Pain Orientation Right    Pain Descriptors / Indicators Aching;Discomfort;Dull    Pain Type Chronic pain    Pain Onset More than a month ago    Pain Frequency Intermittent                     Vestibular Assessment - 07/16/21 0001       Visual Acuity   Static line 10    Dynamic line 7 with incr. c/o dizziness upon completion of test                      [facility] Adult PT Treatment/Exercise - 07/16/21 0001       Transfers   Transfers Sit to Stand;Stand to Sit    Sit to Stand 4: Min  guard    Number of Reps Other reps (comment)   5   Comments EC - feet on floor- from standard chair      Ambulation/Gait   Ambulation/Gait Yes    Ambulation/Gait Assistance 5: Supervision    Ambulation Distance (Feet) 115 Feet    Assistive device Other (Comment)   trekking pole (1)   Gait Pattern Within Functional Limits    Ambulation Surface Level;Indoor      High Level Balance   High Level Balance Activities Head turns   17' with horizontal and vertical head turns            Vestibular Treatment/Exercise - 07/16/21 0001       Vestibular Treatment/Exercise   Gaze Exercises X1 Viewing Horizontal;X1 Viewing Vertical      X1 Viewing Horizontal   Foot Position bil. stance    Time --   30 secs   Reps 1    Comments 5'away from target - plain background      X1 Viewing Vertical   Foot Position bil. stance    Time --   30 secs   Reps 1    Comments 5' away from target -  plain background                Balance Exercises - 07/16/21 0001       Balance Exercises: Standing   Standing Eyes Opened Narrow base of support (BOS);Wide (BOA);Head turns;Foam/compliant surface;5 reps   pt stood on 2 pillows in corner - performed horizontal and vertical head turns 5 reps each   Standing Eyes Closed Narrow base of support (BOS);Wide (BOA);Head turns;Foam/compliant surface;5 reps   2 pillows in corner - horizontal & vertical head turns 5 reps each direction   SLS Eyes open;Solid surface;2 reps   10 sec hold with UE support   Gait with Head Turns Forward;2 reps   35' x 2 reps - 1 with horizontal and 1 with vertical head turns with CGA   Marching Solid surface;Upper extremity assist 1;Head turns;Static   10 reps initially without head turns standing on floor with LUE support on counter; 5 reps with horizontal and 5 reps with vertical head turns with marching on floor               PT Education - 07/16/21 0634     Education Details Discussed benefits of aquatic therapy and answered pt's questions regarding this service as primary PT, Bufford Lope, had recommended for pt.  Gave pt information on trekking poles ([employer]) as this is brand of the clinic's trekking poles. Educated pt in HEP - balance on foam and x1 viewing and marching on floor    Person(s) Educated Patient    Methods Explanation;Demonstration;Handout    Comprehension Verbalized understanding;Returned demonstration              PT Short Term Goals - 07/16/21 0645       PT SHORT TERM GOAL #1   Title Pt will initiate balance HEP and will trial walking with walking stick to be able to return to walking program    Time 4    Period Weeks    Status New    Target Date 08/07/21      PT SHORT TERM GOAL #2   Title Pt will increase # of repetitions on 30 second sit to stand to 14 to indicate increased functional LE strength    Baseline 11    Time 4    Period Weeks  Status New    Target Date  08/07/21      PT SHORT TERM GOAL #3   Title Pt will increase FGA by 5 points to indicate decreased falls risk with dynamic gait.    Baseline 3/30    Time 4    Period Weeks    Status New    Target Date 08/07/21      PT SHORT TERM GOAL #4   Title Pt will demonstrate improved sensory integration by performing MCTSIB: EO conditions for 30 seconds with minimal sway and EC conditions x 15 seconds with close supervision    Time 4    Period Weeks    Status New    Target Date 08/07/21      PT SHORT TERM GOAL #5   Title Pt will demonstrate ability to perform floor <> stand transfer with use of UE and min A x 2 reps    Time 4    Period Weeks    Status New    Target Date 08/07/21               PT Long Term Goals - 07/16/21 0645       PT LONG TERM GOAL #1   Title Pt will demonstrate independence with final HEP, will return to walking program 2-3x/week with LRAD, and will return to chair yoga at [facility]    Time 8    Period Weeks    Status New      PT LONG TERM GOAL #2   Title Pt will demonstrate ability to stand from floor with use of UE x 3 reps MOD I    Time 8    Period Weeks    Status New      PT LONG TERM GOAL #3   Title Pt will increase FGA score to >/= 19/30 to indicate decreased falls risk    Time 8    Period Weeks    Status New      PT LONG TERM GOAL #4   Title Pt will perform MCTSIB conditions with EC x 20 seconds with supervision to improve safety/balance with washing hair and ambulating in low light conditions    Time 8    Period Weeks    Status New      PT LONG TERM GOAL #5   Title Pt will ambulate x 1000 over variety of surfaces, up/down curb, and across parking lot with LRAD, MOD I and perform visual scanning for safety without evidence of imbalance    Time 8    Period Weeks    Status New                   Plan - 07/16/21 [id number]     Clinical Impression Statement Pt has moderate postural instability upon initial sit to stand with attempting to  initiate gait, however, pt reports she does not have the difficulty at home which she has in unfamiliar environments.  Pt has slightly abnormal VOR with a 3 line difference between SVA & DVA.  Pt had some unsteadiness with standing on pillows with EC with feet apart but was able to maintain this position for 30 secs today, whereas, she was unable to do this at eval per chart note.  Pt was not issued standing with EC with feet together with head turns for HEP for safety concerns - was only instructed to stand on compliant surface with EC and hold as needed to chair in front.  Pt has  symptoms consistent with vestibular hypofunction.  Cont with POC.    Personal Factors and Comorbidities Comorbidity 2;Past/Current Experience;Social Background    Examination-Activity Limitations Bend;Locomotion Level;Stairs;Stand;Squat;Transfers    Examination-Participation Restrictions Cleaning;Community Activity;Yard Work    [employer] Evolving/Moderate complexity    Rehab Potential Good    PT Frequency 2x / week    PT Duration 8 weeks    PT Treatment/Interventions ADLs/Self Care Home Management;Aquatic Therapy;Canalith Repostioning;DME Instruction;Gait training;Stair training;Functional mobility training;Therapeutic activities;Therapeutic exercise;Balance training;Neuromuscular re-education;Patient/family education;Vestibular    PT Next Visit Plan Check HEP for any questions or problems; continue with balance/vestibular/gait activities - did pt purchase a trekking pole?    PT Home Exercise Plan balance on foam - feet apart/together with EO and EC; marching in place with head turns and x1 viewing    Consulted and Agree with Plan of Care Patient             Patient will benefit from skilled therapeutic intervention in order to improve the following deficits and impairments:  Abnormal gait, Decreased balance, Difficulty walking, Impaired perceived functional ability, Other (comment)  (disequilibrium)  Visit Diagnosis: Unsteadiness on feet  Difficulty in walking, not elsewhere classified     Problem List Patient Active Problem List   Diagnosis Date Noted   Atypical atrial flutter ([place]) 11/13/2020   Persistent atrial fibrillation ([place]) 06/19/2020   Atrial fibrillation with RVR ([place]) 05/21/2020   Ataxia 05/21/2020   Hyperlipidemia 04/03/2018   GERD (gastroesophageal reflux disease) 03/31/2018    Lisa Moore, [place], PT 07/16/2021, 6:48 AM  [facility] [address] [place], [place], [address] Phone: [phone]   Fax:  [phone]  Name: Lisa Moore MRN: [id number] Date of Birth: [birth year]

## 2021-07-17 ENCOUNTER — Ambulatory Visit: Payer: Medicare Other

## 2021-07-17 ENCOUNTER — Other Ambulatory Visit: Payer: Self-pay

## 2021-07-17 DIAGNOSIS — R2681 Unsteadiness on feet: Secondary | ICD-10-CM

## 2021-07-17 DIAGNOSIS — R262 Difficulty in walking, not elsewhere classified: Secondary | ICD-10-CM | POA: Diagnosis not present

## 2021-07-17 DIAGNOSIS — R296 Repeated falls: Secondary | ICD-10-CM

## 2021-07-17 NOTE — Therapy (Signed)
[facility] [address] [place], [place], [address] Phone: [phone]   Fax:  [phone]  Physical Therapy Treatment  Patient Details  Name: Lisa Moore MRN: [id number] Date of Birth: 11-30-1950 Referring Provider (PT): Blenda Nicely, Henderson Cloud, MD   Encounter Date: 07/17/2021   PT End of Session - 07/17/21 1015     Visit Number 3    Number of Visits 17    Date for PT Re-Evaluation 09/06/21    Authorization Type Medicare    Progress Note Due on Visit 10    PT Start Time [id number]    PT Stop Time 1059    PT Time Calculation (min) 44 min    Equipment Utilized During Treatment Gait belt    Activity Tolerance Patient tolerated treatment well    Behavior During Therapy [facility] for tasks assessed/performed;Anxious             Past Medical History:  Diagnosis Date   Arthritis    Benign hematuria    GERD (gastroesophageal reflux disease)    Migraines    rare   Persistent atrial fibrillation (HCC) 05/21/2020   Typical atrial flutter ([place]) 11/13/2020    Past Surgical History:  Procedure Laterality Date   ABDOMINAL HYSTERECTOMY     APPENDECTOMY     ATRIAL FIBRILLATION ABLATION N/A 01/13/2021   Procedure: ATRIAL FIBRILLATION ABLATION;  Surgeon: Thompson Grayer, MD;  Location: [place] CV LAB;  Service: Cardiovascular;  Laterality: N/A;   CARDIOVERSION N/A 07/07/2020   Procedure: CARDIOVERSION;  Surgeon: Josue Hector, MD;  Location: [facility] ENDOSCOPY;  Service: Cardiovascular;  Laterality: N/A;   CARDIOVERSION N/A 11/07/2020   Procedure: CARDIOVERSION;  Surgeon: Josue Hector, MD;  Location: [facility] ENDOSCOPY;  Service: Cardiovascular;  Laterality: N/A;    There were no vitals filed for this visit.   Subjective Assessment - 07/17/21 1015     Subjective Patient reports no new changes. Did get the walking stick and today is her first time using. Reports she did the exercises, most challenge she reports is eyes closed. No  pain to report. No falls.    Pertinent History OA, Migraines, a-fib with cardioversion - pt elected to stop Eliquis    Limitations Standing;Walking;House hold activities    Diagnostic tests MRI in 2021: negative brain MRI for age, no acute abnormality    Patient Stated Goals To remain independent, prevent falls, be able to garden, to be able to get off the floor independently.    Currently in Pain? No/denies    Pain Onset --                               [facility] Adult PT Treatment/Exercise - 07/17/21 0001       Transfers   Transfers Sit to Stand;Stand to Sit      Ambulation/Gait   Ambulation/Gait Yes    Ambulation/Gait Assistance 5: Supervision    Ambulation/Gait Assistance Details completed ambulation with single walking stick that patient purchased x 115 ft, with patient able to sequence appropriately. Then to further challenge balance x 230 ft without AD working on improved step length and improved reciprocal arm swing.    Ambulation Distance (Feet) 115 Feet   230   Assistive device Other (Comment);None   single trekking pole   Gait Pattern Step-through pattern;Decreased step length - right;Decreased step length - left    Ambulation Surface Level;Indoor  Vestibular Treatment/Exercise - 07/17/21 0001       Vestibular Treatment/Exercise   Vestibular Treatment Provided Gaze    Gaze Exercises X1 Viewing Horizontal;X1 Viewing Vertical      X1 Viewing Horizontal   Foot Position bil. stance    Time --   30 secs; 45 secs   Reps 2    Comments 4' away from target - plain background. progressed to 45 seconds with second rep. mild dizziness      X1 Viewing Vertical   Foot Position bil. stance    Reps 2    Comments 4' away from target - plain background. progressed to 45 seconds with second rep. mild dizziness. removed glasses due to progressive increasing challenge.                Balance Exercises - 07/17/21 0001       Balance Exercises:  Standing   Standing Eyes Opened Wide (BOA);Solid surface;Foam/compliant surface;Head turns;Limitations    Standing Eyes Opened Limitations standing wide BOS on pillows EO, horizontal/vertical head turns x 10 reps each direction. Narrow BOS EO x 30 seconds. All completed on 2 pillows.    Standing Eyes Closed Wide (BOA);Foam/compliant surface;Head turns;Solid surface;Narrow base of support (BOS);Limitations    Standing Eyes Closed Limitations standing on firm surface narrow BOS EC 2 x 30 seconds. Then progressed to pillows with wide BOS and EC 2 x 30 seconds. then maintaining wide BOS and added in horizontal/vertical head turns x 10 reps with eyes closed. increased postural sway noted.    Rockerboard Anterior/posterior;EO;Intermittent UE support;Limitations    Rockerboard Limitations standing on rockerbaord A/P: completed static standing maintaining board steady x 30 seconds, then progressed to alternating UE raises x 10 reps to further challenge balance. intermittent touchA and CGA. Then starting iwth BUE support completed ant/posterior weight shift x 15 reps working toward single UE > no UE support. increased challenge iwth posterior weight shift when UE support removed.                PT Education - 07/17/21 1140     Education Details Continue HEP; Progress VOR to 45 seconds    Person(s) Educated Patient    Methods Explanation    Comprehension Verbalized understanding              PT Short Term Goals - 07/16/21 0645       PT SHORT TERM GOAL #1   Title Pt will initiate balance HEP and will trial walking with walking stick to be able to return to walking program    Time 4    Period Weeks    Status New    Target Date 08/07/21      PT SHORT TERM GOAL #2   Title Pt will increase # of repetitions on 30 second sit to stand to 14 to indicate increased functional LE strength    Baseline 11    Time 4    Period Weeks    Status New    Target Date 08/07/21      PT SHORT TERM GOAL  #3   Title Pt will increase FGA by 5 points to indicate decreased falls risk with dynamic gait.    Baseline 3/30    Time 4    Period Weeks    Status New    Target Date 08/07/21      PT SHORT TERM GOAL #4   Title Pt will demonstrate improved sensory integration by performing MCTSIB: EO conditions for 30 seconds  with minimal sway and EC conditions x 15 seconds with close supervision    Time 4    Period Weeks    Status New    Target Date 08/07/21      PT SHORT TERM GOAL #5   Title Pt will demonstrate ability to perform floor <> stand transfer with use of UE and min A x 2 reps    Time 4    Period Weeks    Status New    Target Date 08/07/21               PT Long Term Goals - 07/16/21 0645       PT LONG TERM GOAL #1   Title Pt will demonstrate independence with final HEP, will return to walking program 2-3x/week with LRAD, and will return to chair yoga at [facility]    Time 8    Period Weeks    Status New      PT LONG TERM GOAL #2   Title Pt will demonstrate ability to stand from floor with use of UE x 3 reps MOD I    Time 8    Period Weeks    Status New      PT LONG TERM GOAL #3   Title Pt will increase FGA score to >/= 19/30 to indicate decreased falls risk    Time 8    Period Weeks    Status New      PT LONG TERM GOAL #4   Title Pt will perform MCTSIB conditions with EC x 20 seconds with supervision to improve safety/balance with washing hair and ambulating in low light conditions    Time 8    Period Weeks    Status New      PT LONG TERM GOAL #5   Title Pt will ambulate x 1000 over variety of surfaces, up/down curb, and across parking lot with LRAD, MOD I and perform visual scanning for safety without evidence of imbalance    Time 8    Period Weeks    Status New                   Plan - 07/17/21 1015     Clinical Impression Statement Today's session focused on review components of HEP provided at prior session, included continued VOR and balance  training with focus on complaint surfaces and balance reactions. Paitent purchased single trekking pole therefore ensured proper sequencing. patient demo improved stability with use of this device. Will continue per POC.    Personal Factors and Comorbidities Comorbidity 2;Past/Current Experience;Social Background    Examination-Activity Limitations Bend;Locomotion Level;Stairs;Stand;Squat;Transfers    Examination-Participation Restrictions Cleaning;Community Activity;Yard Work    [employer] officer Evolving/Moderate complexity    Rehab Potential Good    PT Frequency 2x / week    PT Duration 8 weeks    PT Treatment/Interventions ADLs/Self Care Home Management;Aquatic Therapy;Canalith Repostioning;DME Instruction;Gait training;Stair training;Functional mobility training;Therapeutic activities;Therapeutic exercise;Balance training;Neuromuscular re-education;Patient/family education;Vestibular    PT Next Visit Plan trial trekking pole outdoors unelvel surfaces. continue with balance/vestibular/gait activities. VOR progression. simulation of gardening activities.    PT Home Exercise Plan balance on foam - feet apart/together with EO and EC; marching in place with head turns and x1 viewing    Consulted and Agree with Plan of Care Patient             Patient will benefit from skilled therapeutic intervention in order to improve the following deficits and impairments:  Abnormal gait,  Decreased balance, Difficulty walking, Impaired perceived functional ability, Other (comment) (disequilibrium)  Visit Diagnosis: Unsteadiness on feet  Difficulty in walking, not elsewhere classified  Repeated falls     Problem List Patient Active Problem List   Diagnosis Date Noted   Atypical atrial flutter ([facility]) 11/13/2020   Persistent atrial fibrillation ([facility]) 06/19/2020   Atrial fibrillation with RVR ([facility]) 05/21/2020   Ataxia 05/21/2020   Hyperlipidemia 04/03/2018   GERD  (gastroesophageal reflux disease) 03/31/2018    Tempie Donning, PT, DPT 07/17/2021, 12:07 PM  [facility] [address] [place], [place], [address] Phone: [phone]   Fax:  [phone]  Name: Lisa Moore MRN: [id number] Date of Birth: 03-05-1951

## 2021-07-21 DIAGNOSIS — H2513 Age-related nuclear cataract, bilateral: Secondary | ICD-10-CM | POA: Diagnosis not present

## 2021-07-22 ENCOUNTER — Ambulatory Visit: Payer: Medicare Other

## 2021-07-22 ENCOUNTER — Other Ambulatory Visit: Payer: Self-pay

## 2021-07-22 DIAGNOSIS — R296 Repeated falls: Secondary | ICD-10-CM

## 2021-07-22 DIAGNOSIS — L562 Photocontact dermatitis [berloque dermatitis]: Secondary | ICD-10-CM | POA: Diagnosis not present

## 2021-07-22 DIAGNOSIS — L570 Actinic keratosis: Secondary | ICD-10-CM | POA: Diagnosis not present

## 2021-07-22 DIAGNOSIS — L820 Inflamed seborrheic keratosis: Secondary | ICD-10-CM | POA: Diagnosis not present

## 2021-07-22 DIAGNOSIS — R2681 Unsteadiness on feet: Secondary | ICD-10-CM | POA: Diagnosis not present

## 2021-07-22 DIAGNOSIS — X32XXXA Exposure to sunlight, initial encounter: Secondary | ICD-10-CM | POA: Diagnosis not present

## 2021-07-22 DIAGNOSIS — R262 Difficulty in walking, not elsewhere classified: Secondary | ICD-10-CM | POA: Diagnosis not present

## 2021-07-22 NOTE — Therapy (Signed)
[facility] [address] [place], [place], [address] Phone: [phone]   Fax:  [phone]  Physical Therapy Treatment  Patient Details  Name: Lisa Moore MRN: [id number] Date of Birth: 06/14/1951 Referring Provider (PT): Blenda Nicely, Henderson Cloud, MD   Encounter Date: 07/22/2021   PT End of Session - 07/22/21 0932     Visit Number 4    Number of Visits 17    Date for PT Re-Evaluation 09/06/21    Authorization Type Medicare    Progress Note Due on Visit 10    PT Start Time 0932    PT Stop Time 1014    PT Time Calculation (min) 42 min    Equipment Utilized During Treatment Gait belt    Activity Tolerance Patient tolerated treatment well    Behavior During Therapy [facility] for tasks assessed/performed;Anxious             Past Medical History:  Diagnosis Date   Arthritis    Benign hematuria    GERD (gastroesophageal reflux disease)    Migraines    rare   Persistent atrial fibrillation (HCC) 05/21/2020   Typical atrial flutter ([place]) 11/13/2020    Past Surgical History:  Procedure Laterality Date   ABDOMINAL HYSTERECTOMY     APPENDECTOMY     ATRIAL FIBRILLATION ABLATION N/A 01/13/2021   Procedure: ATRIAL FIBRILLATION ABLATION;  Surgeon: Thompson Grayer, MD;  Location: [facility];  Service: Cardiovascular;  Laterality: N/A;   CARDIOVERSION N/A 07/07/2020   Procedure: CARDIOVERSION;  Surgeon: Josue Hector, MD;  Location: [facility] ENDOSCOPY;  Service: Cardiovascular;  Laterality: N/A;   CARDIOVERSION N/A 11/07/2020   Procedure: CARDIOVERSION;  Surgeon: Josue Hector, MD;  Location: [facility] ENDOSCOPY;  Service: Cardiovascular;  Laterality: N/A;    There were no vitals filed for this visit.   Subjective Assessment - 07/22/21 0935     Subjective Reports she is still doing her exercises, still challenged with eyes closed. No falls. Reports she is feeling more comfortable with the walking stick.    Pertinent History  OA, Migraines, a-fib with cardioversion - pt elected to stop Eliquis    Limitations Standing;Walking;House hold activities    Diagnostic tests MRI in 2021: negative brain MRI for age, no acute abnormality    Patient Stated Goals To remain independent, prevent falls, be able to garden, to be able to get off the floor independently.    Currently in Pain? No/denies               [facility] Adult PT Treatment/Exercise - 07/22/21 0001       Transfers   Transfers Sit to Stand;Stand to Sit      Ambulation/Gait   Ambulation/Gait Yes    Ambulation/Gait Assistance 5: Supervision    Ambulation/Gait Assistance Details completed ambulation x 200 ft indors without device and focus on improved arm swing and equal step length, patient demo improvements prior to last session. then progressed to ambulation outdoors on unlevel surfaces including paved and grass x 400 ft. initially started on grass using walking stick and then progressed to completing without walking stick to further challenge balance. supervision throughout.    Ambulation Distance (Feet) 400 Feet   x1, 200 x 1   Assistive device Other (Comment);None    Gait Pattern Step-through pattern;Decreased step length - right;Decreased step length - left    Ambulation Surface Level;Indoor;Unlevel;Outdoor;Paved;Grass                 Balance  Exercises - 07/22/21 0001       Balance Exercises: Standing   Standing Eyes Opened Narrow base of support (BOS);Head turns;Foam/compliant surface;Limitations    Standing Eyes Opened Limitations standing narrow BOS and EO completed horizontal/vertical head turns x 10 reps.    Standing Eyes Closed Wide (BOA);Narrow base of support (BOS);Foam/compliant surface;Head turns;Limitations    Standing Eyes Closed Limitations standing feet hip width completed horizontal/vertical head turns x 10 reps with EO, then with narrow BOS EC 3 x 30 seconds on airex.    SLS with Vectors Foam/compliant surface;Intermittent  upper extremity assist;Limitations    SLS with Vectors Limitations standing on blue mat on incline completed alternating toe taps to colored pebbles x 5 reps bilat with single UE support x 10 reps bilat without UE support.    Wall Bumps Hip;Eyes opened;10 reps;Limitations    Wall Bumps Limitations x 2 sets, focus on eccentric control and technique    Rockerboard Anterior/posterior;Lateral;EO;Head turns;Intermittent UE support;Limitations    Rockerboard Limitations standing on rockerbaord A/P: completed static standing maintaining board steady x 30 seconds, then completed ant/posterior weight shift x 15 reps working toward single UE > no UE support. continue to demo more challenge with posterior weight shift. then transitioned to rockerboard lateral: completed static standing with eyes open 2 x 30 seconds, then progressed to horizontal head turns x 10 reps, intermittent touch A to // bars.    Other Standing Exercises standing on incline on blue mat without UE support: and feet hip width apart completed eyes closed 3 x 30 seconds, then eyes closed with horizontal/vetical head turns x 10 reps each direction. more challenge noted with horizontal > vertical.                  PT Short Term Goals - 07/16/21 0645       PT SHORT TERM GOAL #1   Title Pt will initiate balance HEP and will trial walking with walking stick to be able to return to walking program    Time 4    Period Weeks    Status New    Target Date 08/07/21      PT SHORT TERM GOAL #2   Title Pt will increase # of repetitions on 30 second sit to stand to 14 to indicate increased functional LE strength    Baseline 11    Time 4    Period Weeks    Status New    Target Date 08/07/21      PT SHORT TERM GOAL #3   Title Pt will increase FGA by 5 points to indicate decreased falls risk with dynamic gait.    Baseline 3/30    Time 4    Period Weeks    Status New    Target Date 08/07/21      PT SHORT TERM GOAL #4   Title Pt will  demonstrate improved sensory integration by performing MCTSIB: EO conditions for 30 seconds with minimal sway and EC conditions x 15 seconds with close supervision    Time 4    Period Weeks    Status New    Target Date 08/07/21      PT SHORT TERM GOAL #5   Title Pt will demonstrate ability to perform floor <> stand transfer with use of UE and min A x 2 reps    Time 4    Period Weeks    Status New    Target Date 08/07/21  PT Long Term Goals - 07/16/21 0645       PT LONG TERM GOAL #1   Title Pt will demonstrate independence with final HEP, will return to walking program 2-3x/week with LRAD, and will return to chair yoga at [facility]    Time 8    Period Weeks    Status New      PT LONG TERM GOAL #2   Title Pt will demonstrate ability to stand from floor with use of UE x 3 reps MOD I    Time 8    Period Weeks    Status New      PT LONG TERM GOAL #3   Title Pt will increase FGA score to >/= 19/30 to indicate decreased falls risk    Time 8    Period Weeks    Status New      PT LONG TERM GOAL #4   Title Pt will perform MCTSIB conditions with EC x 20 seconds with supervision to improve safety/balance with washing hair and ambulating in low light conditions    Time 8    Period Weeks    Status New      PT LONG TERM GOAL #5   Title Pt will ambulate x 1000 over variety of surfaces, up/down curb, and across parking lot with LRAD, MOD I and perform visual scanning for safety without evidence of imbalance    Time 8    Period Weeks    Status New                   Plan - 07/22/21 1103     Clinical Impression Statement Today's skilled PT session focused on continued gait training indoors and outdoors working toawrd reduced support during session to further challenge balance. then continued high level balance and balance strategies with focus on complaint surfaces today. continue to demo more usnteadiness with horizontal > vertical head turns. will continue to  progress toward all LTGs.    Personal Factors and Comorbidities Comorbidity 2;Past/Current Experience;Social Background    Examination-Activity Limitations Bend;Locomotion Level;Stairs;Stand;Squat;Transfers    Examination-Participation Restrictions Cleaning;Community Activity;Yard Work    Conservation officer, historic buildings Evolving/Moderate complexity    Rehab Potential Good    PT Frequency 2x / week    PT Duration 8 weeks    PT Treatment/Interventions ADLs/Self Care Home Management;Aquatic Therapy;Canalith Repostioning;DME Instruction;Gait training;Stair training;Functional mobility training;Therapeutic activities;Therapeutic exercise;Balance training;Neuromuscular re-education;Patient/family education;Vestibular    PT Next Visit Plan continue with balance/vestibular/gait activities. VOR progression. simulation of gardening activities. gait outdoors on unlevel surfaces, include inclines.    PT Home Exercise Plan balance on foam - feet apart/together with EO and EC; marching in place with head turns and x1 viewing    Consulted and Agree with Plan of Care Patient             Patient will benefit from skilled therapeutic intervention in order to improve the following deficits and impairments:  Abnormal gait, Decreased balance, Difficulty walking, Impaired perceived functional ability, Other (comment) (disequilibrium)  Visit Diagnosis: Unsteadiness on feet  Difficulty in walking, not elsewhere classified  Repeated falls     Problem List Patient Active Problem List   Diagnosis Date Noted   Atypical atrial flutter (HCC) 11/13/2020   Persistent atrial fibrillation (HCC) 06/19/2020   Atrial fibrillation with RVR (HCC) 05/21/2020   Ataxia 05/21/2020   Hyperlipidemia 04/03/2018   GERD (gastroesophageal reflux disease) 03/31/2018    Tempie Donning, PT, DPT 07/22/2021, 11:06 AM  [facility]  [facility] [address] [place],  [place], [address] Phone: [phone]   Fax:  [phone]  Name: Lisa Moore MRN: [id number] Date of Birth: 01/17/1951

## 2021-07-23 ENCOUNTER — Ambulatory Visit: Payer: Medicare Other | Admitting: Physical Therapy

## 2021-07-23 ENCOUNTER — Encounter: Payer: Self-pay | Admitting: Physical Therapy

## 2021-07-23 DIAGNOSIS — R296 Repeated falls: Secondary | ICD-10-CM | POA: Diagnosis not present

## 2021-07-23 DIAGNOSIS — R262 Difficulty in walking, not elsewhere classified: Secondary | ICD-10-CM

## 2021-07-23 DIAGNOSIS — R2681 Unsteadiness on feet: Secondary | ICD-10-CM

## 2021-07-23 NOTE — Therapy (Signed)
[facility] [address] [place], [place], [address] Phone: [phone]   Fax:  [phone]  Physical Therapy Treatment  Patient Details  Name: Lisa Moore MRN: [id number] Date of Birth: 03-02-1951 Referring Provider (Lisa Moore): Doran Heater, Darliss Ridgel, MD   Encounter Date: 07/23/2021   Lisa Moore End of Session - 07/23/21 1117     Visit Number 5    Number of Visits 17    Date for Lisa Moore Re-Evaluation 09/06/21    Authorization Type Medicare    Progress Note Due on Visit 10    Lisa Moore Start Time 0854    Lisa Moore Stop Time 0939    Lisa Moore Time Calculation (min) 45 min    Activity Tolerance Patient tolerated treatment well    Behavior During Therapy [facility] for tasks assessed/performed             Past Medical History:  Diagnosis Date   Arthritis    Benign hematuria    GERD (gastroesophageal reflux disease)    Migraines    rare   Persistent atrial fibrillation (HCC) 05/21/2020   Typical atrial flutter (HCC) 11/13/2020    Past Surgical History:  Procedure Laterality Date   ABDOMINAL HYSTERECTOMY     APPENDECTOMY     ATRIAL FIBRILLATION ABLATION N/A 01/13/2021   Procedure: ATRIAL FIBRILLATION ABLATION;  Surgeon: Hillis Range, MD;  Location: [facility] INVASIVE CV LAB;  Service: Cardiovascular;  Laterality: N/A;   CARDIOVERSION N/A 07/07/2020   Procedure: CARDIOVERSION;  Surgeon: Wendall Stade, MD;  Location: [facility] ENDOSCOPY;  Service: Cardiovascular;  Laterality: N/A;   CARDIOVERSION N/A 11/07/2020   Procedure: CARDIOVERSION;  Surgeon: Wendall Stade, MD;  Location: [facility] ENDOSCOPY;  Service: Cardiovascular;  Laterality: N/A;    There were no vitals filed for this visit.   Subjective Assessment - 07/23/21 0858     Subjective Walking stick is a back up when walking but feels much more confident when walking.    Pertinent History OA, Migraines, a-fib with cardioversion - Lisa Moore elected to stop Eliquis    Limitations Standing;Walking;House hold activities     Diagnostic tests MRI in 2021: negative brain MRI for age, no acute abnormality    Patient Stated Goals To remain independent, prevent falls, be able to garden, to be able to get off the floor independently.    Currently in Pain? No/denies               Vestibular Treatment/Exercise - 07/23/21 1114       Vestibular Treatment/Exercise   Vestibular Treatment Provided Gaze    Gaze Exercises X1 Viewing Horizontal;X1 Viewing Vertical      X1 Viewing Horizontal   Foot Position feet apart no UE support > feet together, finger tip touch for support    Reps 2    Comments plain background, 60 seconds; mild symptoms with feet together      X1 Viewing Vertical   Foot Position feet apart no UE support > feet together, finger tip touch for support    Reps 2    Comments plain background, 60 seconds feet apart, only able to tolerate 45 seconds with feet together; mild symptoms with feet together                Balance Exercises - 07/23/21 1112       Balance Exercises: Standing   Standing Eyes Closed Narrow base of support (BOS);Wide (BOA);Foam/compliant surface;Head turns;5 reps    Standing Eyes Closed Limitations standing on pillow: first feet apart  and then together.  Lisa Moore unable to perform with feet completely together, even with UE support, so moved feet slightly apart, one finger touch to chair for support.  5 reps head turns and head nods.    Marching Solid surface;Head turns;Static;5 reps;Limitations    Marching Limitations marching in place on solid surface with natural arm swing and performing head turns to R and L every 3 marches; intermittent return to forward gaze for balance as needed.  Mild instability noted                Lisa Moore Education - 07/23/21 1116     Education Details information sheet about aquatic therapy, therapeutic use of water for balance training, setting up first aquatic appointment, how to prepare and safety measures taken in the water.  Updating HEP     Person(s) Educated Patient    Methods Explanation;Handout;Demonstration    Comprehension Verbalized understanding;Returned demonstration            Gaze Stabilization: Standing Feet Together    Feet together, keeping eyes on target on wall __5__ feet away, tilt head down 15-30 and move head side to side for ___60_ seconds.  Repeat while moving head up and down for __45__ seconds, but try to work up to 60 seconds Do __2-3__ sessions per day.   Marching In-Place     Standing straight, alternate marching in place. Let arms swing naturally. Once you start marching, turn your head to the right for 3 marches, middle for 3 marches, left for 3 marches.   Keep changing between R, middle, L, middle for 5 repetitions.  Stop if you feel really off balance. Do _1-2__ times per day.   Feet Together (Compliant Surface) Head Motion - Eyes Closed    Stand on compliant surface: pillow with feet CLOSE together, but not touching. Close eyes and move head slowly, side to side, 5 times. Rest Close eyes and move head slowly, up and down, 5 times Repeat 2 times per session. Do 2 sessions per day.       Aquatic Therapy: What to Expect!  Where:  [facility] [address] [place], [place]  [address] [phone]  NOTE:  You will receive an automated phone message reminding you of your appointment and it will say the appointment is at the [facility] on [address]  We are working to fix this- just know that you will meet [place] at the pool!  How to Prepare: Please make sure you drink 8 ounces of water about one hour prior to your pool session A caregiver MUST attend the entire session with the patient.  The caregiver will be responsible for assisting with dressing as well as any toileting needs.  If the patient will be doing a home program this should likely be the person who will assist as well.  Patients must wear either their street shoes or pool shoes  until they are ready to enter the pool with the therapist.  Patients must also wear either street shoes or pool shoes once exiting the pool to walk to the locker room.  This will helps [place] prevent slips and falls.  Please arrive 15 minutes early to prepare for your pool therapy session Sign in at the front desk on the clipboard marked for [place] You may use the locker rooms on your right and then enter directly into the recreation pool (NOT the competition pool) Please make sure to attend to any toileting needs prior to entering  the pool Please be dressed in your swim suit and on the pool deck at least 5 minutes before your appointment Once on the pool deck your therapist will ask you to sign the Patient  Consent and Assignment of Benefits form Your therapist may take your blood pressure prior to, during and after your session if indicated  About the pool  and parking: Entering the pool Your therapist will assist you; there are 2 ways to enter:  stairs with railings or with a chair lift.   Your therapist will determine the most appropriate way for you. Water temperature is usually between 86-87 degrees There may be other swimmers in the pool at the same time Parking is free.   Contact Info:     Appointments: [facility]  All sessions are 45 minutes   [address] [address]     Please call the [facility] if   [place], [place]   [address]    you need to cancel or reschedule an appointment.  [phone]         Lisa Moore Short Term Goals - 07/16/21 0645       Lisa Moore SHORT TERM GOAL #1   Title Lisa Moore will initiate balance HEP and will trial walking with walking stick to be able to return to walking program    Time 4    Period Weeks    Status New    Target Date 08/07/21      Lisa Moore SHORT TERM GOAL #2   Title Lisa Moore will increase # of repetitions on 30 second sit to stand to 14 to indicate increased functional LE strength    Baseline 11    Time 4    Period  Weeks    Status New    Target Date 08/07/21      Lisa Moore SHORT TERM GOAL #3   Title Lisa Moore will increase FGA by 5 points to indicate decreased falls risk with dynamic gait.    Baseline 3/30    Time 4    Period Weeks    Status New    Target Date 08/07/21      Lisa Moore SHORT TERM GOAL #4   Title Lisa Moore will demonstrate improved sensory integration by performing MCTSIB: EO conditions for 30 seconds with minimal sway and EC conditions x 15 seconds with close supervision    Time 4    Period Weeks    Status New    Target Date 08/07/21      Lisa Moore SHORT TERM GOAL #5   Title Lisa Moore will demonstrate ability to perform floor <> stand transfer with use of UE and min A x 2 reps    Time 4    Period Weeks    Status New    Target Date 08/07/21               Lisa Moore Long Term Goals - 07/16/21 0645       Lisa Moore LONG TERM GOAL #1   Title Lisa Moore will demonstrate independence with final HEP, will return to walking program 2-3x/week with LRAD, and will return to chair yoga at [facility]    Time 8    Period Weeks    Status New      Lisa Moore LONG TERM GOAL #2   Title Lisa Moore will demonstrate ability to stand from floor with use of UE x 3 reps MOD I    Time 8    Period Weeks    Status New  Lisa Moore LONG TERM GOAL #3   Title Lisa Moore will increase FGA score to >/= 19/30 to indicate decreased falls risk    Time 8    Period Weeks    Status New      Lisa Moore LONG TERM GOAL #4   Title Lisa Moore will perform MCTSIB conditions with EC x 20 seconds with supervision to improve safety/balance with washing hair and ambulating in low light conditions    Time 8    Period Weeks    Status New      Lisa Moore LONG TERM GOAL #5   Title Lisa Moore will ambulate x 1000 over variety of surfaces, up/down curb, and across parking lot with LRAD, MOD I and perform visual scanning for safety without evidence of imbalance    Time 8    Period Weeks    Status New                   Plan - 07/23/21 1117     Clinical Impression Statement Lisa Moore is making good progress; able to progress  VOR to more narrow BOS today and able to progress balance training to narrow BOS on compliant surface with removal of visual input while performing head movements for primarily vestibular stimulation.  Lisa Moore did experience greater instability and disequilibrium with narrow BOS but did not experience a LOB.  Will continue to progress towards LTG.    Personal Factors and Comorbidities Comorbidity 2;Past/Current Experience;Social Background    Examination-Activity Limitations Bend;Locomotion Level;Stairs;Stand;Squat;Transfers    Examination-Participation Restrictions Cleaning;Community Activity;Yard Work    Merchant navy officer Evolving/Moderate complexity    Rehab Potential Good    Lisa Moore Frequency 2x / week    Lisa Moore Duration 8 weeks    Lisa Moore Treatment/Interventions ADLs/Self Care Home Management;Aquatic Therapy;Canalith Repostioning;DME Instruction;Gait training;Stair training;Functional mobility training;Therapeutic activities;Therapeutic exercise;Balance training;Neuromuscular re-education;Patient/family education;Vestibular    Lisa Moore Next Visit Plan Practice getting down and up off the floor: simulation of gardening activities. Continue to progres VOR and balance with narrow BOS.  EC. gait outdoors on unlevel surfaces, include inclines.    Lisa Moore Home Exercise Plan --    Consulted and Agree with Plan of Care Patient             Patient will benefit from skilled therapeutic intervention in order to improve the following deficits and impairments:  Abnormal gait, Decreased balance, Difficulty walking, Impaired perceived functional ability, Other (comment) (disequilibrium)  Visit Diagnosis: Unsteadiness on feet  Difficulty in walking, not elsewhere classified  Repeated falls     Problem List Patient Active Problem List   Diagnosis Date Noted   Atypical atrial flutter ([place]) 11/13/2020   Persistent atrial fibrillation ([place]) 06/19/2020   Atrial fibrillation with RVR ([place]) 05/21/2020   Ataxia  05/21/2020   Hyperlipidemia 04/03/2018   GERD (gastroesophageal reflux disease) 03/31/2018    Lisa Moore, Lisa Moore, Lisa Moore 07/23/21    11:25 AM    [facility] [address] [place], [place], [address] Phone: [phone]   Fax:  [phone]  Name: Lisa Moore MRN: [id number] Date of Birth: [birth year]

## 2021-07-23 NOTE — Patient Instructions (Addendum)
Gaze Stabilization: Standing Feet Together    Feet together, keeping eyes on target on wall __5__ feet away, tilt head down 15-30 and move head side to side for ___60_ seconds.  Repeat while moving head up and down for __45__ seconds, but try to work up to 60 seconds Do __2-3__ sessions per day.   Marching In-Place     Standing straight, alternate marching in place. Let arms swing naturally. Once you start marching, turn your head to the right for 3 marches, middle for 3 marches, left for 3 marches.   Keep changing between R, middle, L, middle for 5 repetitions.  Stop if you feel really off balance. Do _1-2__ times per day.   Feet Together (Compliant Surface) Head Motion - Eyes Closed    Stand on compliant surface: pillow with feet CLOSE together, but not touching. Close eyes and move head slowly, side to side, 5 times. Rest Close eyes and move head slowly, up and down, 5 times Repeat 2 times per session. Do 2 sessions per day.       Aquatic Therapy: What to Expect!  Where:  [facility] [address] [place], [place]  [address] [phone]  NOTE:  You will receive an automated phone message reminding you of your appointment and it will say the appointment is at the [facility] on [address]  We are working to fix this- just know that you will meet [place] at the pool!  How to Prepare: Please make sure you drink 8 ounces of water about one hour prior to your pool session A caregiver MUST attend the entire session with the patient.  The caregiver will be responsible for assisting with dressing as well as any toileting needs.  If the patient will be doing a home program this should likely be the person who will assist as well.  Patients must wear either their street shoes or pool shoes until they are ready to enter the pool with the therapist.  Patients must also wear either street shoes or pool shoes once exiting the pool to walk to the  locker room.  This will helps [place] prevent slips and falls.  Please arrive 15 minutes early to prepare for your pool therapy session Sign in at the front desk on the clipboard marked for [place] You may use the locker rooms on your right and then enter directly into the recreation pool (NOT the competition pool) Please make sure to attend to any toileting needs prior to entering the pool Please be dressed in your swim suit and on the pool deck at least 5 minutes before your appointment Once on the pool deck your therapist will ask you to sign the Patient  Consent and Assignment of Benefits form Your therapist may take your blood pressure prior to, during and after your session if indicated  About the pool  and parking: Entering the pool Your therapist will assist you; there are 2 ways to enter:  stairs with railings or with a chair lift.   Your therapist will determine the most appropriate way for you. Water temperature is usually between 86-87 degrees There may be other swimmers in the pool at the same time Parking is free.   Contact Info:     Appointments: [facility]  All sessions are 45 minutes   [address]  [address]     Please call the [facility] if   [place], [place]   [address]  you need to cancel or reschedule an appointment.  [phone]

## 2021-07-29 ENCOUNTER — Ambulatory Visit (INDEPENDENT_AMBULATORY_CARE_PROVIDER_SITE_OTHER): Payer: Medicare Other | Admitting: Family

## 2021-07-29 ENCOUNTER — Other Ambulatory Visit: Payer: Self-pay

## 2021-07-29 ENCOUNTER — Encounter: Payer: Self-pay | Admitting: Family

## 2021-07-29 VITALS — BP 122/70 | HR 71 | Temp 97.2°F | Wt 194.4 lb

## 2021-07-29 DIAGNOSIS — R2689 Other abnormalities of gait and mobility: Secondary | ICD-10-CM

## 2021-07-29 DIAGNOSIS — I4891 Unspecified atrial fibrillation: Secondary | ICD-10-CM

## 2021-07-29 NOTE — Progress Notes (Signed)
Subjective:     Patient ID: Lisa Moore, female    DOB: Oct 16, 1950, 70 y.o.   MRN: [id number]  Chief Complaint  Patient presents with   Transitions Of Care   Atrial Fibrillation   Balance problems    HPI Arrhythmia: Patient presents for follow up of  Atrial fibrillation . Onset was  months ago, with improving course since that time, hx of 2 cardioversions & ablation procedure 90mos. ago.  The patient denies chest pain, cough, dizziness, leg swelling, palpitations, rapid heart beat, shortness of breath, and syncope.  The patient has a past history of atrial fibrillation.  The patient denies a past history of CABG, CAD, CHF, DM, HTN, and hyperlipidemia.   Gait disturbance:  reports starting a few months ago, pt fell into a small hole, denies injury, but since has had trouble with her balance. Seen by ENT who ordered physical therapy which she states has been helping. Using a cane with ambulation.  Health Maintenance Due  Topic Date Due   TETANUS/TDAP  Never done   COLONOSCOPY (Pts 45-51yrs Insurance coverage will need to be confirmed)  Never done   DEXA SCAN  Never done   Pneumonia Vaccine 89+ Years old (2 - PPSV23 if available, else PCV20) 04/01/2019   MAMMOGRAM  04/03/2020   COVID-19 Vaccine (5 - Booster for Pfizer series) 07/23/2021    Past Medical History:  Diagnosis Date   Arthritis    Benign hematuria    GERD (gastroesophageal reflux disease)    Migraines    rare   Persistent atrial fibrillation ([place]) 05/21/2020   Typical atrial flutter ([place]) 11/13/2020    Past Surgical History:  Procedure Laterality Date   ABDOMINAL HYSTERECTOMY     APPENDECTOMY     ATRIAL FIBRILLATION ABLATION N/A 01/13/2021   Procedure: ATRIAL FIBRILLATION ABLATION;  Surgeon: Thompson Grayer, MD;  Location: [facility];  Service: Cardiovascular;  Laterality: N/A;   CARDIOVERSION N/A 07/07/2020   Procedure: CARDIOVERSION;  Surgeon: Josue Hector, MD;  Location: [facility] ENDOSCOPY;  Service:  Cardiovascular;  Laterality: N/A;   CARDIOVERSION N/A 11/07/2020   Procedure: CARDIOVERSION;  Surgeon: Josue Hector, MD;  Location: [facility] ENDOSCOPY;  Service: Cardiovascular;  Laterality: N/A;    Outpatient Medications Prior to Visit  Medication Sig Dispense Refill   acetaminophen (TYLENOL) 500 MG tablet Take 1,000 mg by mouth every 6 (six) hours as needed for moderate pain or headache.     diltiazem (CARDIZEM CD) 120 MG 24 hr capsule Take 1 capsule (120 mg total) by mouth daily. 90 capsule 3   apixaban (ELIQUIS) 5 MG TABS tablet Take 1 tablet (5 mg total) by mouth 2 (two) times daily. (Patient not taking: Reported on 07/07/2021) 14 tablet 0   No facility-administered medications prior to visit.    No Known Allergies      Objective:    Physical Exam Vitals and nursing note reviewed.  Constitutional:      Appearance: Normal appearance. She is obese.  Cardiovascular:     Rate and Rhythm: Normal rate and regular rhythm.  Pulmonary:     Effort: Pulmonary effort is normal.     Breath sounds: Normal breath sounds.  Musculoskeletal:        General: Normal range of motion.  Skin:    General: Skin is warm and dry.  Neurological:     Mental Status: She is alert.     Gait: Gait abnormal (using cane for balance).  Psychiatric:  Mood and Affect: Mood normal.        Behavior: Behavior normal.    BP 122/70   Pulse 71   Temp (!) 97.2 F (36.2 C)   Wt 194 lb 6.4 oz (88.2 kg)   LMP  (LMP Unknown)   SpO2 96%   BMI 32.35 kg/m  Wt Readings from Last 3 Encounters:  07/29/21 194 lb 6.4 oz (88.2 kg)  04/13/21 206 lb 12.8 oz (93.8 kg)  02/10/21 216 lb 12.8 oz (98.3 kg)       Assessment & Plan:   Problem List Items Addressed This Visit       Cardiovascular and Mediastinum   Atrial fibrillation with RVR (HCC)    Pt reports previous unsuccessful cardioversions x 2,  had ablation procedure about 22mos. And has felt good other than some balance issues, working with PT now.  Has  f/u appt with cardiology in Feb., remains on Cardizem, stopped Eliquis.        Other   Balance problems - Primary    Reports starting a couple of months ago, seen by ENT thru [facility] and no vestibular cause, sent to physical therapy which she states is helping.       No orders of the defined types were placed in this encounter.

## 2021-07-29 NOTE — Patient Instructions (Signed)
It was very nice to meet you today! ° ° ° °PLEASE NOTE: ° °If you had any lab tests please let us know if you have not heard back within a few days. You may see your results on MyChart before we have a chance to review them but we will give you a call once they are reviewed by us. If we ordered any referrals today, please let us know if you have not heard from their office within the next week.  ° °Please try these tips to maintain a healthy lifestyle: ° °Eat most of your calories during the day when you are active. Eliminate processed foods including packaged sweets (pies, cakes, cookies), reduce intake of potatoes, white bread, white pasta, and white rice. Look for whole grain options, oat flour or almond flour. ° °Each meal should contain half fruits/vegetables, one quarter protein, and one quarter carbs (no bigger than a computer mouse). ° °Cut down on sweet beverages. This includes juice, soda, and sweet tea. Also watch fruit intake, though this is a healthier sweet option, it still contains natural sugar! Limit to 3 servings daily. ° °Drink at least 1 glass of water with each meal and aim for at least 8 glasses per day ° °Exercise at least 150 minutes every week.  ° °

## 2021-07-29 NOTE — Assessment & Plan Note (Signed)
Reports starting a couple of months ago, seen by ENT thru [facility] and no vestibular cause, sent to physical therapy which she states is helping.

## 2021-07-29 NOTE — Assessment & Plan Note (Addendum)
Pt reports previous unsuccessful cardioversions x 2,  had ablation procedure about 55mos. And has felt good other than some balance issues, working with PT now.  Has f/u appt with cardiology in Feb., remains on Cardizem, stopped Eliquis.

## 2021-07-30 ENCOUNTER — Ambulatory Visit: Payer: Medicare Other | Admitting: Physical Therapy

## 2021-07-30 DIAGNOSIS — R2681 Unsteadiness on feet: Secondary | ICD-10-CM | POA: Diagnosis not present

## 2021-07-30 DIAGNOSIS — R262 Difficulty in walking, not elsewhere classified: Secondary | ICD-10-CM

## 2021-07-30 DIAGNOSIS — R296 Repeated falls: Secondary | ICD-10-CM | POA: Diagnosis not present

## 2021-07-30 NOTE — Patient Instructions (Signed)
Feet Heel-Toe "Tandem"    Arms outstretched, walk a straight line bringing one foot directly in front of the other. Repeat for 4 laps per session. Do __2__ sessions per day.   Gaze Stabilization: Standing Feet Together    Feet together, keeping eyes on target on wall __5__ feet away, tilt head down 15-30 and move head side to side for ___60_ seconds.  Repeat while moving head up and down for __45__ seconds, but try to work up to 60 seconds Do __2-3__ sessions per day.   Marching In-Place     Standing straight, alternate marching in place. Let arms swing naturally. Once you start marching, turn your head to the right for 3 marches, middle for 3 marches, left for 3 marches.   Keep changing between R, middle, L, middle for 5 repetitions.  Stop if you feel really off balance. Do _1-2__ times per day.   Feet Together (Compliant Surface) Head Motion - Eyes Closed    Stand on compliant surface: pillow with feet CLOSE together, but not touching. Close eyes and move head slowly, side to side, 5 times. Rest Close eyes and move head slowly, up and down, 5 times Repeat 2 times per session. Do 2 sessions per day.       Aquatic Therapy: What to Expect!  Where:  [facility] at [facility] [address] [place], [place]  [address] [phone]  NOTE:  You will receive an automated phone message reminding you of your appointment and it will say the appointment is at the [facility] on [address]  We are working to fix this- just know that you will meet [place] at the pool!  How to Prepare: Please make sure you drink 8 ounces of water about one hour prior to your pool session A caregiver MUST attend the entire session with the patient.  The caregiver will be responsible for assisting with dressing as well as any toileting needs.  If the patient will be doing a home program this should likely be the person who will assist as well.  Patients must wear either their  street shoes or pool shoes until they are ready to enter the pool with the therapist.  Patients must also wear either street shoes or pool shoes once exiting the pool to walk to the locker room.  This will helps [place] prevent slips and falls.  Please arrive 15 minutes early to prepare for your pool therapy session Sign in at the front desk on the clipboard marked for [place] You may use the locker rooms on your right and then enter directly into the recreation pool (NOT the competition pool) Please make sure to attend to any toileting needs prior to entering the pool Please be dressed in your swim suit and on the pool deck at least 5 minutes before your appointment Once on the pool deck your therapist will ask you to sign the Patient  Consent and Assignment of Benefits form Your therapist may take your blood pressure prior to, during and after your session if indicated  About the pool  and parking: Entering the pool Your therapist will assist you; there are 2 ways to enter:  stairs with railings or with a chair lift.   Your therapist will determine the most appropriate way for you. Water temperature is usually between 86-87 degrees There may be other swimmers in the pool at the same time Parking is free.   Contact Info:     Appointments: [facility]  All sessions are 45 minutes   [address]  [address]     Please call the [facility] if   [place], [place]   [address]    you need to cancel or reschedule an appointment.  [phone]

## 2021-07-30 NOTE — Therapy (Signed)
[facility] [address] [place], [place], [address] Phone: [phone]   Fax:  [phone]  Physical Therapy Treatment  Patient Details  Name: Lisa Moore MRN: [id number] Date of Birth: 1951-04-30 Referring Provider (PT): Doran Heater, Darliss Ridgel, MD   Encounter Date: 07/30/2021   PT End of Session - 07/30/21 1136     Visit Number 6    Number of Visits 17    Date for PT Re-Evaluation 09/06/21    Authorization Type Medicare    Progress Note Due on Visit 10    PT Start Time 0847    PT Stop Time 0933    PT Time Calculation (min) 46 min    Activity Tolerance Patient tolerated treatment well    Behavior During Therapy [facility] for tasks assessed/performed             Past Medical History:  Diagnosis Date   Arthritis    Benign hematuria    GERD (gastroesophageal reflux disease)    Migraines    rare   Persistent atrial fibrillation (HCC) 05/21/2020   Typical atrial flutter (HCC) 11/13/2020    Past Surgical History:  Procedure Laterality Date   ABDOMINAL HYSTERECTOMY     APPENDECTOMY     ATRIAL FIBRILLATION ABLATION N/A 01/13/2021   Procedure: ATRIAL FIBRILLATION ABLATION;  Surgeon: Hillis Range, MD;  Location: [facility] INVASIVE CV LAB;  Service: Cardiovascular;  Laterality: N/A;   CARDIOVERSION N/A 07/07/2020   Procedure: CARDIOVERSION;  Surgeon: Wendall Stade, MD;  Location: [facility] ENDOSCOPY;  Service: Cardiovascular;  Laterality: N/A;   CARDIOVERSION N/A 11/07/2020   Procedure: CARDIOVERSION;  Surgeon: Wendall Stade, MD;  Location: [facility] ENDOSCOPY;  Service: Cardiovascular;  Laterality: N/A;    There were no vitals filed for this visit.   Subjective Assessment - 07/30/21 0850     Subjective Pt got new progressive lenses yesterday; feels a little off today, trying to get used to them.  Mask is fogging up the glasses.  Had appointment yesterday to follow up on A-fib, no issues.    Pertinent History OA, Migraines,  a-fib with cardioversion - pt elected to stop Eliquis    Limitations Standing;Walking;House hold activities    Diagnostic tests MRI in 2021: negative brain MRI for age, no acute abnormality    Patient Stated Goals To remain independent, prevent falls, be able to garden, to be able to get off the floor independently.    Currently in Pain? No/denies              [facility] Adult PT Treatment/Exercise - 07/30/21 0911       Transfers   Transfers Floor to Transfer    Floor to Transfer 3: Mod assist    Floor to Transfer Details (indicate cue type and reason) using gardening bench with handles and knee pad.  Required mod A to transition to tall kneeling on bench x 2-3 reps and multiple attempts to weight shift forwards.  Transitioned to half kneeling with RLE with mod A and to standing with Mod A but unable to perform with LLE.  Unable to shift backwards from tall kneeling to standing from bench.    Comments Performed standing > tall kneeling on tall bench with one UE support on trekking pole x 3 reps alternating which LE leading with.  Required min-mod A from therapist for balance when weight shifting and during tall kneeling stance.      Ambulation/Gait   Ambulation/Gait Yes      High  Level Balance   High Level Balance Activities Negotiating over obstacles    High Level Balance Comments Tall and medium obstacles x 8 reps while ambulating with trekking pole.  Focused on SLS and full LE clearance over obstacle, increasing stride length and weight shifting forwards in staggered stance.  Performed two laps down and back with min A from therapist for balance and providing cues for technique.               PT Education - 07/30/21 1135     Education Details added tandem gait to HEP with countertop and trekking pole for support; focus on forward weight shift    Person(s) Educated Patient    Methods Explanation;Demonstration    Comprehension Verbalized understanding;Returned demonstration               PT Short Term Goals - 07/16/21 0645       PT SHORT TERM GOAL #1   Title Pt will initiate balance HEP and will trial walking with walking stick to be able to return to walking program    Time 4    Period Weeks    Status New    Target Date 08/07/21      PT SHORT TERM GOAL #2   Title Pt will increase # of repetitions on 30 second sit to stand to 14 to indicate increased functional LE strength    Baseline 11    Time 4    Period Weeks    Status New    Target Date 08/07/21      PT SHORT TERM GOAL #3   Title Pt will increase FGA by 5 points to indicate decreased falls risk with dynamic gait.    Baseline 3/30    Time 4    Period Weeks    Status New    Target Date 08/07/21      PT SHORT TERM GOAL #4   Title Pt will demonstrate improved sensory integration by performing MCTSIB: EO conditions for 30 seconds with minimal sway and EC conditions x 15 seconds with close supervision    Time 4    Period Weeks    Status New    Target Date 08/07/21      PT SHORT TERM GOAL #5   Title Pt will demonstrate ability to perform floor <> stand transfer with use of UE and min A x 2 reps    Time 4    Period Weeks    Status New    Target Date 08/07/21               PT Long Term Goals - 07/16/21 0645       PT LONG TERM GOAL #1   Title Pt will demonstrate independence with final HEP, will return to walking program 2-3x/week with LRAD, and will return to chair yoga at [facility]    Time 8    Period Weeks    Status New      PT LONG TERM GOAL #2   Title Pt will demonstrate ability to stand from floor with use of UE x 3 reps MOD I    Time 8    Period Weeks    Status New      PT LONG TERM GOAL #3   Title Pt will increase FGA score to >/= 19/30 to indicate decreased falls risk    Time 8    Period Weeks    Status New      PT LONG TERM GOAL #  4   Title Pt will perform MCTSIB conditions with EC x 20 seconds with supervision to improve safety/balance with washing hair and  ambulating in low light conditions    Time 8    Period Weeks    Status New      PT LONG TERM GOAL #5   Title Pt will ambulate x 1000 over variety of surfaces, up/down curb, and across parking lot with LRAD, MOD I and perform visual scanning for safety without evidence of imbalance    Time 8    Period Weeks    Status New                   Plan - 07/30/21 1137     Clinical Impression Statement Treatment session focused on patient's goal of returning to gardening and being able to get down on and off the floor independently.  Began with kneeling on gardening bench and returning to stand but pt demonstrated increased difficulty with weight shifting and weakness to be able to stand from lower garden bench.  Transitioned to taller bench with continued difficulty with forward weight shift.  Performed stepping over obstacles and tandem gait to continue to focus on anterior weight shifting and proximal hip strengthening.  Tandem gait added to HEP.    Personal Factors and Comorbidities Comorbidity 2;Past/Current Experience;Social Background    Examination-Activity Limitations Bend;Locomotion Level;Stairs;Stand;Squat;Transfers    Examination-Participation Restrictions Cleaning;Community Activity;Yard Work    [employer] Evolving/Moderate complexity    Rehab Potential Good    PT Frequency 2x / week    PT Duration 8 weeks    PT Treatment/Interventions ADLs/Self Care Home Management;Aquatic Therapy;Canalith Repostioning;DME Instruction;Gait training;Stair training;Functional mobility training;Therapeutic activities;Therapeutic exercise;Balance training;Neuromuscular re-education;Patient/family education;Vestibular    PT Next Visit Plan Glute med/hip strengthening.  Tall kneeling <> standing on taller bench/mat slowly progressing towards garden bench and floor.  Then, add simulation of gardening activities. Continue to progres VOR and balance with narrow BOS.  EC. gait  outdoors on unlevel surfaces, include inclines.    Consulted and Agree with Plan of Care Patient             Patient will benefit from skilled therapeutic intervention in order to improve the following deficits and impairments:  Abnormal gait, Decreased balance, Difficulty walking, Impaired perceived functional ability, Other (comment) (disequilibrium)  Visit Diagnosis: Unsteadiness on feet  Difficulty in walking, not elsewhere classified  Repeated falls     Problem List Patient Active Problem List   Diagnosis Date Noted   Atypical atrial flutter ([place]) 11/13/2020   Persistent atrial fibrillation ([place]) 06/19/2020   Atrial fibrillation with RVR ([place]) 05/21/2020   Balance problems 05/21/2020   Hyperlipidemia 04/03/2018   GERD (gastroesophageal reflux disease) 03/31/2018    Rico Junker, PT, DPT 07/30/21    11:46 AM    [facility] [address] [place], [place], [address] Phone: [phone]   Fax:  [phone]  Name: Radiya Minehart Maskell MRN: [id number] Date of Birth: Mar 21, 1951

## 2021-07-31 ENCOUNTER — Ambulatory Visit: Payer: Medicare Other | Admitting: Physical Therapy

## 2021-07-31 ENCOUNTER — Other Ambulatory Visit: Payer: Self-pay

## 2021-07-31 DIAGNOSIS — R262 Difficulty in walking, not elsewhere classified: Secondary | ICD-10-CM

## 2021-07-31 DIAGNOSIS — R296 Repeated falls: Secondary | ICD-10-CM

## 2021-07-31 DIAGNOSIS — R2681 Unsteadiness on feet: Secondary | ICD-10-CM | POA: Diagnosis not present

## 2021-07-31 NOTE — Patient Instructions (Signed)
Bridge Pose, One Leg    Start with both feet on bed.  Squeeze your butt muscles, lift your hips. Keep the hips lifted while you lift one foot up.  Foot down, hips down. Repeat, lifting the other foot. Repeat __5__ times each leg.    Hip Abduction: Modified    Lying on right side, right knee and hip bent.  Top leg out straight. Lift the top leg up towards the ceiling (doesn't have to be too high), keeping the pelvis rolled forwards but keep the leg from drifting forwards.   Repeat __10__ times per side.    Hip Abduction: Side-Stepping: Monster Steps (Eccentric)    Band around thighs.  Keeping the feet forwards, sit in a small squat and perform side steps against the resistance of the band to the right and then switch and step back to the left.  4 laps down and back, __2_ sets per day.     Feet Heel-Toe "Tandem"    Arms outstretched, walk a straight line bringing one foot directly in front of the other. Repeat for 4 laps per session. Do __2__ sessions per day.   Gaze Stabilization: Standing Feet Together    Feet together, keeping eyes on target on wall __5__ feet away, tilt head down 15-30 and move head side to side for ___60_ seconds.  Repeat while moving head up and down for __45__ seconds, but try to work up to 60 seconds Do __2-3__ sessions per day.   Marching In-Place     Standing straight, alternate marching in place. Let arms swing naturally. Once you start marching, turn your head to the right for 3 marches, middle for 3 marches, left for 3 marches.   Keep changing between R, middle, L, middle for 5 repetitions.  Stop if you feel really off balance. Do _1-2__ times per day.   Feet Together (Compliant Surface) Head Motion - Eyes Closed    Stand on compliant surface: pillow with feet CLOSE together, but not touching. Close eyes and move head slowly, side to side, 5 times. Rest Close eyes and move head slowly, up and down, 5 times Repeat 2 times per  session. Do 2 sessions per day.

## 2021-08-02 NOTE — Therapy (Signed)
[facility] [address] [place], [place], [address] Phone: [phone]   Fax:  [phone]  Physical Therapy Treatment  Patient Details  Name: Lisa Moore MRN: [id number] Date of Birth: May 13, 1951 Referring Provider (PT): Blenda Nicely, Henderson Cloud, MD   Encounter Date: 07/31/2021   PT End of Session - 08/02/21 1444     Visit Number 7    Number of Visits 17    Date for PT Re-Evaluation 09/06/21    Authorization Type Medicare    Progress Note Due on Visit 10    PT Start Time 0937    PT Stop Time 1015    PT Time Calculation (min) 38 min    Activity Tolerance Patient tolerated treatment well    Behavior During Therapy [facility] for tasks assessed/performed             Past Medical History:  Diagnosis Date   Arthritis    Benign hematuria    GERD (gastroesophageal reflux disease)    Migraines    rare   Persistent atrial fibrillation ([place]) 05/21/2020   Typical atrial flutter ([place]) 11/13/2020    Past Surgical History:  Procedure Laterality Date   ABDOMINAL HYSTERECTOMY     APPENDECTOMY     ATRIAL FIBRILLATION ABLATION N/A 01/13/2021   Procedure: ATRIAL FIBRILLATION ABLATION;  Surgeon: Thompson Grayer, MD;  Location: [facility];  Service: Cardiovascular;  Laterality: N/A;   CARDIOVERSION N/A 07/07/2020   Procedure: CARDIOVERSION;  Surgeon: Josue Hector, MD;  Location: [facility] ENDOSCOPY;  Service: Cardiovascular;  Laterality: N/A;   CARDIOVERSION N/A 11/07/2020   Procedure: CARDIOVERSION;  Surgeon: Josue Hector, MD;  Location: [facility] ENDOSCOPY;  Service: Cardiovascular;  Laterality: N/A;    There were no vitals filed for this visit.   07/31/21 0941  Symptoms/Limitations  Subjective Is getting used her new glasses; took her trekking pole out for multiple walks yesterday.  Hips are pretty sore from yesterday.  Had to take some medication to help her sleep last night and to get through therapy this morning.   Pertinent History OA, Migraines, a-fib with cardioversion - pt elected to stop Eliquis  Limitations Standing;Walking;House hold activities  Diagnostic tests MRI in 2021: negative brain MRI for age, no acute abnormality  Patient Stated Goals To remain independent, prevent falls, be able to garden, to be able to get off the floor independently.  Pain Assessment  Currently in Pain? Yes  Pain Score 5  Pain Location Hip      [facility] Adult PT Treatment/Exercise - 08/02/21 1452       Exercises   Exercises Knee/Hip      Knee/Hip Exercises: Standing   Hip Abduction Stengthening;Right;Left;2 sets;Knee bent;Limitations    Abduction Limitations resisted side stepping to L x 10 steps and then R x 10 steps first with red theraband around thighs progressing to green for increased resistance.  Cued to keep toes forward and to remain in slight squat while performing side stepping      Knee/Hip Exercises: Supine   Bridges Strengthening;Both;1 set;10 reps    Bridges Limitations cues for glute activation    Single Leg Bridge Strengthening;Right;Left;1 set;10 reps;Limitations   bilat bridge > lifting one LE with hips elevated, alternating R and L each rep     Knee/Hip Exercises: Sidelying   Hip ABduction Strengthening;Right;Left;1 set;10 reps;Limitations    Hip ABduction Limitations cues to keep pelvis rolled forwards and keep LE in line with body to prevent compensation with IT band  or hip flexor              PT Education - 08/02/21 1443     Education Details proximal hip strengthening exercises; reviewed pool/aquatic information one more time before first aquatic session on Tuesday    Person(s) Educated Patient    Methods Explanation;Demonstration;Handout    Comprehension Verbalized understanding;Returned demonstration              PT Short Term Goals - 07/16/21 0645       PT SHORT TERM GOAL #1   Title Pt will initiate balance HEP and will trial walking with walking stick to be able  to return to walking program    Time 4    Period Weeks    Status New    Target Date 08/07/21      PT SHORT TERM GOAL #2   Title Pt will increase # of repetitions on 30 second sit to stand to 14 to indicate increased functional LE strength    Baseline 11    Time 4    Period Weeks    Status New    Target Date 08/07/21      PT SHORT TERM GOAL #3   Title Pt will increase FGA by 5 points to indicate decreased falls risk with dynamic gait.    Baseline 3/30    Time 4    Period Weeks    Status New    Target Date 08/07/21      PT SHORT TERM GOAL #4   Title Pt will demonstrate improved sensory integration by performing MCTSIB: EO conditions for 30 seconds with minimal sway and EC conditions x 15 seconds with close supervision    Time 4    Period Weeks    Status New    Target Date 08/07/21      PT SHORT TERM GOAL #5   Title Pt will demonstrate ability to perform floor <> stand transfer with use of UE and min A x 2 reps    Time 4    Period Weeks    Status New    Target Date 08/07/21               PT Long Term Goals - 07/16/21 0645       PT LONG TERM GOAL #1   Title Pt will demonstrate independence with final HEP, will return to walking program 2-3x/week with LRAD, and will return to chair yoga at [facility]    Time 8    Period Weeks    Status New      PT LONG TERM GOAL #2   Title Pt will demonstrate ability to stand from floor with use of UE x 3 reps MOD I    Time 8    Period Weeks    Status New      PT LONG TERM GOAL #3   Title Pt will increase FGA score to >/= 19/30 to indicate decreased falls risk    Time 8    Period Weeks    Status New      PT LONG TERM GOAL #4   Title Pt will perform MCTSIB conditions with EC x 20 seconds with supervision to improve safety/balance with washing hair and ambulating in low light conditions    Time 8    Period Weeks    Status New      PT LONG TERM GOAL #5   Title Pt will ambulate x 1000 over variety of surfaces, up/down curb, and  across parking lot with LRAD, MOD I and perform visual scanning for safety without evidence of imbalance    Time 8    Period Weeks    Status New                   Plan - 08/02/21 1444     Clinical Impression Statement Transitioned focus today to performing and providing patient with proximal hip/glute strengthening exercises to assist with dynamic balance, weight shifting and SLS balance.  Pt reporting some soreness from therapy yesterday but able to perform exercises provided today with cues for technique and sequencing.  Will continue to address next week with first aquatic therapy session.    Personal Factors and Comorbidities Comorbidity 2;Past/Current Experience;Social Background    Examination-Activity Limitations Bend;Locomotion Level;Stairs;Stand;Squat;Transfers    Examination-Participation Restrictions Cleaning;Community Activity;Yard Work    Merchant navy officer Evolving/Moderate complexity    Rehab Potential Good    PT Frequency 2x / week    PT Duration 8 weeks    PT Treatment/Interventions ADLs/Self Care Home Management;Aquatic Therapy;Canalith Repostioning;DME Instruction;Gait training;Stair training;Functional mobility training;Therapeutic activities;Therapeutic exercise;Balance training;Neuromuscular re-education;Patient/family education;Vestibular    PT Next Visit Plan How are hip exercises going?  Aquatic-hip strengthening, step ups with head turns, balance while walking, balance on noodle.  Tall kneeling <> standing on taller bench/mat slowly progressing towards garden bench and floor.  Then, add simulation of gardening activities. Continue to progres VOR and balance with narrow BOS.  EC. gait outdoors on unlevel surfaces, include inclines.    Consulted and Agree with Plan of Care Patient             Patient will benefit from skilled therapeutic intervention in order to improve the following deficits and impairments:  Abnormal gait, Decreased  balance, Difficulty walking, Impaired perceived functional ability, Other (comment) (disequilibrium)  Visit Diagnosis: Unsteadiness on feet  Difficulty in walking, not elsewhere classified  Repeated falls     Problem List Patient Active Problem List   Diagnosis Date Noted   Atypical atrial flutter ([place]) 11/13/2020   Persistent atrial fibrillation ([place]) 06/19/2020   Atrial fibrillation with RVR ([place]) 05/21/2020   Balance problems 05/21/2020   Hyperlipidemia 04/03/2018   GERD (gastroesophageal reflux disease) 03/31/2018   Rico Junker, PT, DPT 08/02/21    2:56 PM  [facility] [address] [place], [place], [address] Phone: [phone]   Fax:  [phone]  Name: Lisa Moore MRN: [id number] Date of Birth: Apr 18, 1951

## 2021-08-03 ENCOUNTER — Ambulatory Visit: Payer: Medicare Other

## 2021-08-03 ENCOUNTER — Other Ambulatory Visit: Payer: Self-pay

## 2021-08-03 DIAGNOSIS — R2681 Unsteadiness on feet: Secondary | ICD-10-CM

## 2021-08-03 DIAGNOSIS — R296 Repeated falls: Secondary | ICD-10-CM

## 2021-08-03 DIAGNOSIS — R262 Difficulty in walking, not elsewhere classified: Secondary | ICD-10-CM

## 2021-08-03 NOTE — Therapy (Signed)
[facility] [address] [place], [place], [address] Phone: [phone]   Fax:  [phone]  Physical Therapy Treatment  Patient Details  Name: Lisa Moore MRN: [id number] Date of Birth: [birth year] Referring Provider (PT): Blenda Nicely, Henderson Cloud, MD   Encounter Date: 08/03/2021   PT End of Session - 08/03/21 1016     Visit Number 8    Number of Visits 17    Date for PT Re-Evaluation 09/06/21    Authorization Type Medicare    Progress Note Due on Visit 10    PT Start Time 1016    PT Stop Time 1100    PT Time Calculation (min) 44 min    Equipment Utilized During Treatment Gait belt    Activity Tolerance Patient tolerated treatment well    Behavior During Therapy [facility] for tasks assessed/performed             Past Medical History:  Diagnosis Date   Arthritis    Benign hematuria    GERD (gastroesophageal reflux disease)    Migraines    rare   Persistent atrial fibrillation ([place]) 05/21/2020   Typical atrial flutter ([place]) 11/13/2020    Past Surgical History:  Procedure Laterality Date   ABDOMINAL HYSTERECTOMY     APPENDECTOMY     ATRIAL FIBRILLATION ABLATION N/A 01/13/2021   Procedure: ATRIAL FIBRILLATION ABLATION;  Surgeon: Thompson Grayer, MD;  Location: [facility];  Service: Cardiovascular;  Laterality: N/A;   CARDIOVERSION N/A 07/07/2020   Procedure: CARDIOVERSION;  Surgeon: Josue Hector, MD;  Location: [facility] ENDOSCOPY;  Service: Cardiovascular;  Laterality: N/A;   CARDIOVERSION N/A 11/07/2020   Procedure: CARDIOVERSION;  Surgeon: Josue Hector, MD;  Location: [facility] ENDOSCOPY;  Service: Cardiovascular;  Laterality: N/A;    There were no vitals filed for this visit.   Subjective Assessment - 08/03/21 1019     Subjective Patient reports she is doing well. She is excited to start the pool. Does have some hip soreness this morning.    Pertinent History OA, Migraines, a-fib with cardioversion - pt  elected to stop Eliquis    Limitations Standing;Walking;House hold activities    Diagnostic tests MRI in 2021: negative brain MRI for age, no acute abnormality    Patient Stated Goals To remain independent, prevent falls, be able to garden, to be able to get off the floor independently.    Currently in Pain? Yes    Pain Score 2     Pain Location Hip    Pain Orientation Right    Pain Descriptors / Indicators Aching;Discomfort                [facility] PT Assessment - 08/03/21 0001       Functional Gait  Assessment   Gait assessed  Yes    Gait Level Surface Walks 20 ft, slow speed, abnormal gait pattern, evidence for imbalance or deviates 10-15 in outside of the 12 in walkway width. Requires more than 7 sec to ambulate 20 ft.    Change in Gait Speed Makes only minor adjustments to walking speed, or accomplishes a change in speed with significant gait deviations, deviates 10-15 in outside the 12 in walkway width, or changes speed but loses balance but is able to recover and continue walking.    Gait with Horizontal Head Turns Performs head turns with moderate changes in gait velocity, slows down, deviates 10-15 in outside 12 in walkway width but recovers, can continue to walk.  Gait with Vertical Head Turns Performs task with slight change in gait velocity (eg, minor disruption to smooth gait path), deviates 6 - 10 in outside 12 in walkway width or uses assistive device    Gait and Pivot Turn Pivot turns safely in greater than 3 sec and stops with no loss of balance, or pivot turns safely within 3 sec and stops with mild imbalance, requires small steps to catch balance.    Step Over Obstacle Is able to step over one shoe box (4.5 in total height) but must slow down and adjust steps to clear box safely. May require verbal cueing.    Gait with Narrow Base of Support Ambulates less than 4 steps heel to toe or cannot perform without assistance.    Gait with Eyes Closed Walks 20 ft, slow speed,  abnormal gait pattern, evidence for imbalance, deviates 10-15 in outside 12 in walkway width. Requires more than 9 sec to ambulate 20 ft.    Ambulating Backwards Walks 20 ft, slow speed, abnormal gait pattern, evidence for imbalance, deviates 10-15 in outside 12 in walkway width.    Steps Alternating feet, must use rail.    Total Score 12               [facility] Adult PT Treatment/Exercise - 08/03/21 0001       Transfers   Transfers Floor to Transfer    Five time sit to stand comments  30 Second Chair Stand Test: completed 14 reps without UE support from standard chair.    Floor to Transfer 4: Min assist;4: Min guard    Floor to Transfer Details (indicate cue type and reason) using mat for UE  support (see therapeutic activities for details)      Ambulation/Gait   Ambulation/Gait Yes    Ambulation/Gait Assistance 5: Supervision    Ambulation/Gait Assistance Details throughout therapy session without AD.    Assistive device None    Gait Pattern Step-through pattern;Decreased step length - right;Decreased step length - left    Ambulation Surface Level;Indoor      Therapeutic Activites    Therapeutic Activities Other Therapeutic Activities    Other Therapeutic Activities perform floor <> stand transfer with use of UE on mat x 2 reps, PT providing cues for proper completion and technique. Min A initially but progressed to CGA throughout. Patient tolerating well.      Neuro Re-ed    Neuro Re-ed Details  Completed M-CTSIB: patient able to hold all four situation for full 30 seconds, continue to have sway noted with EC requiring close supervision.             Completed verbal review of HEP, patient denies questions/concerns at this time.   Bridge Pose, One Leg    Start with both feet on bed.  Squeeze your butt muscles, lift your hips. Keep the hips lifted while you lift one foot up.  Foot down, hips down. Repeat, lifting the other foot. Repeat __5__ times each leg.      Hip  Abduction: Modified    Lying on right side, right knee and hip bent.  Top leg out straight. Lift the top leg up towards the ceiling (doesn't have to be too high), keeping the pelvis rolled forwards but keep the leg from drifting forwards.   Repeat __10__ times per side.       Hip Abduction: Side-Stepping: Monster Steps (Eccentric)    Band around thighs.  Keeping the feet forwards, sit in a small squat  and perform side steps against the resistance of the band to the right and then switch and step back to the left.  4 laps down and back, __2_ sets per day.      Feet Heel-Toe "Tandem"    Arms outstretched, walk a straight line bringing one foot directly in front of the other. Repeat for 4 laps per session. Do __2__ sessions per day.    Gaze Stabilization: Standing Feet Together    Feet together, keeping eyes on target on wall __5__ feet away, tilt head down 15-30 and move head side to side for ___60_ seconds.  Repeat while moving head up and down for __45__ seconds, but try to work up to 60 seconds Do __2-3__ sessions per day.     Marching In-Place     Standing straight, alternate marching in place. Let arms swing naturally. Once you start marching, turn your head to the right for 3 marches, middle for 3 marches, left for 3 marches.   Keep changing between R, middle, L, middle for 5 repetitions.  Stop if you feel really off balance. Do _1-2__ times per day.     Feet Together (Compliant Surface) Head Motion - Eyes Closed    Stand on compliant surface: pillow with feet CLOSE together, but not touching. Close eyes and move head slowly, side to side, 5 times. Rest Close eyes and move head slowly, up and down, 5 times Repeat 2 times per session. Do 2 sessions per day.       PT Education - 08/03/21 1104     Education Details progress toward STGs    Person(s) Educated Patient    Methods Explanation    Comprehension Verbalized understanding               PT Short Term Goals - 08/03/21 1022       PT SHORT TERM GOAL #1   Title Pt will initiate balance HEP and will trial walking with walking stick to be able to return to walking program    Baseline reports independence with current HEP and use of walking stick for walking program    Time 4    Period Weeks    Status Achieved    Target Date 08/07/21      PT SHORT TERM GOAL #2   Title Pt will increase # of repetitions on 30 second sit to stand to 14 to indicate increased functional LE strength    Baseline 11, 14 sit <> stands without UE support    Time 4    Period Weeks    Status Achieved    Target Date 08/07/21      PT SHORT TERM GOAL #3   Title Pt will increase FGA by 5 points to indicate decreased falls risk with dynamic gait.    Baseline 3/30; 12/30    Time 4    Period Weeks    Status Achieved    Target Date 08/07/21      PT SHORT TERM GOAL #4   Title Pt will demonstrate improved sensory integration by performing MCTSIB: EO conditions for 30 seconds with minimal sway and EC conditions x 15 seconds with close supervision    Baseline All Four situation 30 seconds with supervision    Time 4    Period Weeks    Status Achieved    Target Date 08/07/21      PT SHORT TERM GOAL #5   Title Pt will demonstrate ability to perform floor <> stand transfer  with use of UE and min A x 2 reps    Baseline able to complete floor <> stand with UE on mat x 2 reps with CGA    Time 4    Period Weeks    Status Achieved    Target Date 08/07/21               PT Long Term Goals - 07/16/21 0645       PT LONG TERM GOAL #1   Title Pt will demonstrate independence with final HEP, will return to walking program 2-3x/week with LRAD, and will return to chair yoga at [employer]    Time 8    Period Weeks    Status New      PT LONG TERM GOAL #2   Title Pt will demonstrate ability to stand from floor with use of UE x 3 reps MOD I    Time 8    Period Weeks    Status New      PT LONG TERM GOAL #3    Title Pt will increase FGA score to >/= 19/30 to indicate decreased falls risk    Time 8    Period Weeks    Status New      PT LONG TERM GOAL #4   Title Pt will perform MCTSIB conditions with EC x 20 seconds with supervision to improve safety/balance with washing hair and ambulating in low light conditions    Time 8    Period Weeks    Status New      PT LONG TERM GOAL #5   Title Pt will ambulate x 1000 over variety of surfaces, up/down curb, and across parking lot with LRAD, MOD I and perform visual scanning for safety without evidence of imbalance    Time 8    Period Weeks    Status New                   Plan - 08/03/21 1113     Clinical Impression Statement Today's skilled PT session focused on assesment of progress toward STGs. Patient able to meet all STG today demonstrating improved balance, strength and reduced fall risk. Patient improved FGA from 3/30 to 12/30 demonstrating improved balance. Patient also able to complete 14 sit <> stands without UE support in 30 seconds demo improved functional strength. Continued focus on floor <> stand transfer continue to require Min A at times for completion. Patient is making significant progress with PT services, and will continue to benefit from skilled PT.    Personal Factors and Comorbidities Comorbidity 2;Past/Current Experience;Social Background    Examination-Activity Limitations Bend;Locomotion Level;Stairs;Stand;Squat;Transfers    Examination-Participation Restrictions Cleaning;Community Activity;Yard Work    Conservation officer, historic buildings Evolving/Moderate complexity    Rehab Potential Good    PT Frequency 2x / week    PT Duration 8 weeks    PT Treatment/Interventions ADLs/Self Care Home Management;Aquatic Therapy;Canalith Repostioning;DME Instruction;Gait training;Stair training;Functional mobility training;Therapeutic activities;Therapeutic exercise;Balance training;Neuromuscular re-education;Patient/family  education;Vestibular    PT Next Visit Plan Aquatic-hip strengthening, step ups with head turns, balance while walking, balance on noodle.  Tall kneeling <> standing on taller bench/mat slowly progressing towards garden bench and floor.  Then, add simulation of gardening activities. Continue to progres VOR and balance with narrow BOS.  EC. gait outdoors on unlevel surfaces, include inclines.    Consulted and Agree with Plan of Care Patient             Patient will benefit from skilled  therapeutic intervention in order to improve the following deficits and impairments:  Abnormal gait, Decreased balance, Difficulty walking, Impaired perceived functional ability, Other (comment) (disequilibrium)  Visit Diagnosis: Unsteadiness on feet  Difficulty in walking, not elsewhere classified  Repeated falls     Problem List Patient Active Problem List   Diagnosis Date Noted   Atypical atrial flutter ([place]) 11/13/2020   Persistent atrial fibrillation ([place]) 06/19/2020   Atrial fibrillation with RVR ([place]) 05/21/2020   Balance problems 05/21/2020   Hyperlipidemia 04/03/2018   GERD (gastroesophageal reflux disease) 03/31/2018    Jones Bales, PT, DPT 08/03/2021, 11:17 AM  [facility] [address] [place], [place], [address] Phone: [phone]   Fax:  [phone]  Name: Lisa Moore MRN: [id number] Date of Birth: Feb 07, 1951

## 2021-08-04 ENCOUNTER — Ambulatory Visit: Payer: Medicare Other | Admitting: Physical Therapy

## 2021-08-04 DIAGNOSIS — R296 Repeated falls: Secondary | ICD-10-CM

## 2021-08-04 DIAGNOSIS — R2681 Unsteadiness on feet: Secondary | ICD-10-CM

## 2021-08-04 DIAGNOSIS — R262 Difficulty in walking, not elsewhere classified: Secondary | ICD-10-CM

## 2021-08-05 NOTE — Therapy (Signed)
[facility] [address] [place], [place], [address] Phone: [phone]   Fax:  [phone]  Physical Therapy Treatment  Patient Details  Name: Lisa Moore MRN: [id number] Date of Birth: 09-Apr-1951 Referring Provider (PT): Blenda Nicely, Henderson Cloud, MD   Encounter Date: 08/04/2021   PT End of Session - 08/04/21 1150     Visit Number 9    Number of Visits 17    Date for PT Re-Evaluation 09/06/21    Authorization Type Medicare    Progress Note Due on Visit 10    PT Start Time 1050    PT Stop Time 1135    PT Time Calculation (min) 45 min    Equipment Utilized During Treatment Other (comment)   pool noodle, ankle weights   Activity Tolerance Patient tolerated treatment well    Behavior During Therapy [facility] for tasks assessed/performed             Past Medical History:  Diagnosis Date   Arthritis    Benign hematuria    GERD (gastroesophageal reflux disease)    Migraines    rare   Persistent atrial fibrillation ([place]) 05/21/2020   Typical atrial flutter ([place]) 11/13/2020    Past Surgical History:  Procedure Laterality Date   ABDOMINAL HYSTERECTOMY     APPENDECTOMY     ATRIAL FIBRILLATION ABLATION N/A 01/13/2021   Procedure: ATRIAL FIBRILLATION ABLATION;  Surgeon: Thompson Grayer, MD;  Location: [facility];  Service: Cardiovascular;  Laterality: N/A;   CARDIOVERSION N/A 07/07/2020   Procedure: CARDIOVERSION;  Surgeon: Josue Hector, MD;  Location: [facility] ENDOSCOPY;  Service: Cardiovascular;  Laterality: N/A;   CARDIOVERSION N/A 11/07/2020   Procedure: CARDIOVERSION;  Surgeon: Josue Hector, MD;  Location: [facility] ENDOSCOPY;  Service: Cardiovascular;  Laterality: N/A;    There were no vitals filed for this visit.   Subjective Assessment - 08/04/21 1148     Subjective Hip is a little sore today but ready to do the pool.    Pertinent History OA, Migraines, a-fib with cardioversion - pt elected to stop Eliquis     Limitations Standing;Walking;House hold activities    Diagnostic tests MRI in 2021: negative brain MRI for age, no acute abnormality    Patient Stated Goals To remain independent, prevent falls, be able to garden, to be able to get off the floor independently.    Currently in Pain? No/denies            Patient seen for first aquatic therapy session today.  Treatment took place in water 3-3.6 feet deep depending upon activity.  Pt entered and exited the pool via stairs with bilat UE support on rails.  Pt reporting feeling very nervous in the water and required hands on, min A throughout session for balance, stability and safety.  Also remained in shallow region of pool due to feeling nervous about water.  Began with UE support on wall and facing blank wall to decrease visual stimuli.  Focused on standing x 10 seconds releasing UE support on wall and maintaining balance against current of water x 3 reps.  Performed side stepping along wall to L and R x 3 reps with min A and forwards/retro gait with one UE support on wall and HHA x 3 reps.  Performed progressive balance training beginning with UE support on wall, progressing to UE on noodle pressed against wall >> UE on noodle away from wall allowing noodle to move with patient's body.  Performed wide  stance weight shifting laterally x 10 reps and then staggered stance anterior/posterior weight shifting x 10 reps each foot forwards with each UE hold focusing on keeping feet grounded and weight shifting through pelvis while maintaining head righting in midline.  Progressed from weight shifting to side stepping in place and then backwards and forwards stepping in place x 10 reps with each hand hold and min A.  Finally returned to side stepping L<>R down pool wall but with UE support on noodle; also performed retro/forwards walking 2-3 feet holding pool noodle only but keeping vision on blank wall.  Incorporated turns away from wall: performed 90 deg  turns to R and L keeping one UE support on wall, x 5 reps each side and then 5 reps alternating L and R.    Performed hip strengthening with 3 way hip kicks: side ABD, glute extension, hip flexion march x 10 reps each side with black/red ankle weights, and 10 reps squats with bilat UE support on wall with cues to maintain upright posture and decrease compensatory trunk flexion/lateral flexion.  Finished session with forwards walking in shallow water across width of pool with UE support on noodle only with PT providing min A beginning with gaze forwards and then incorporating 1 head turn to R or L; performed down and back x 3 reps.    When exiting pool pt required min A to walk across pool deck due to feeling more unsteady and slightly dizzy.  Pt able to utilize trekking pole to enter locker room independently.  Pt requires buoyancy of water for support for joint offloading for pain reduction in hip, to reduce fall risk with gait training and balance exercises with minimal UE support; exercises able to be performed safely in water without the risk of fall compared to those same exercises performed on land; viscosity of water needed for resistance for strengthening.  Current of water provides perturbations for challenging static & dynamic standing balance.    PT Short Term Goals - 08/03/21 1022       PT SHORT TERM GOAL #1   Title Pt will initiate balance HEP and will trial walking with walking stick to be able to return to walking program    Baseline reports independence with current HEP and use of walking stick for walking program    Time 4    Period Weeks    Status Achieved    Target Date 08/07/21      PT SHORT TERM GOAL #2   Title Pt will increase # of repetitions on 30 second sit to stand to 14 to indicate increased functional LE strength    Baseline 11, 14 sit <> stands without UE support    Time 4    Period Weeks    Status Achieved    Target Date 08/07/21      PT SHORT TERM GOAL #3    Title Pt will increase FGA by 5 points to indicate decreased falls risk with dynamic gait.    Baseline 3/30; 12/30    Time 4    Period Weeks    Status Achieved    Target Date 08/07/21      PT SHORT TERM GOAL #4   Title Pt will demonstrate improved sensory integration by performing MCTSIB: EO conditions for 30 seconds with minimal sway and EC conditions x 15 seconds with close supervision    Baseline All Four situation 30 seconds with supervision    Time 4    Period Weeks  Status Achieved    Target Date 08/07/21      PT SHORT TERM GOAL #5   Title Pt will demonstrate ability to perform floor <> stand transfer with use of UE and min A x 2 reps    Baseline able to complete floor <> stand with UE on mat x 2 reps with CGA    Time 4    Period Weeks    Status Achieved    Target Date 08/07/21               PT Long Term Goals - 07/16/21 0645       PT LONG TERM GOAL #1   Title Pt will demonstrate independence with final HEP, will return to walking program 2-3x/week with LRAD, and will return to chair yoga at [facility]    Time 8    Period Weeks    Status New      PT LONG TERM GOAL #2   Title Pt will demonstrate ability to stand from floor with use of UE x 3 reps MOD I    Time 8    Period Weeks    Status New      PT LONG TERM GOAL #3   Title Pt will increase FGA score to >/= 19/30 to indicate decreased falls risk    Time 8    Period Weeks    Status New      PT LONG TERM GOAL #4   Title Pt will perform MCTSIB conditions with EC x 20 seconds with supervision to improve safety/balance with washing hair and ambulating in low light conditions    Time 8    Period Weeks    Status New      PT LONG TERM GOAL #5   Title Pt will ambulate x 1000 over variety of surfaces, up/down curb, and across parking lot with LRAD, MOD I and perform visual scanning for safety without evidence of imbalance    Time 8    Period Weeks    Status New                   Plan - 08/04/21  1151     Clinical Impression Statement Initiated aquatic therapy today with focus on dynamic standing balance, vestibular training and LE strengthening.  Pt experienced increased balance challenge with viscosity of the water and intermittent motion sensitivity due to the movement of the water.  Pt required constant UE support throughout session but was able to progress away from UE support on the wall to the more unstable support of a noodle.  Will continue to progress aquatic therapy by decreasing UE support and performing exercises away from the side of the pool where pt will experience greater visual challenge and water current.  Pt reported at the end of the session that she would like to continue with aquatic therapy.    Personal Factors and Comorbidities Comorbidity 2;Past/Current Experience;Social Background    Examination-Activity Limitations Bend;Locomotion Level;Stairs;Stand;Squat;Transfers    Examination-Participation Restrictions Cleaning;Community Activity;Yard Work    Conservation officer, historic buildings Evolving/Moderate complexity    Rehab Potential Good    PT Frequency 2x / week    PT Duration 8 weeks    PT Treatment/Interventions ADLs/Self Care Home Management;Aquatic Therapy;Canalith Repostioning;DME Instruction;Gait training;Stair training;Functional mobility training;Therapeutic activities;Therapeutic exercise;Balance training;Neuromuscular re-education;Patient/family education;Vestibular    PT Next Visit Plan 10th visit PN due; Aquatic-hip strengthening, step ups with head turns, balance while walking, balance on noodle.  Tall kneeling <> standing on taller  bench/mat slowly progressing towards garden bench and floor.  Then, add simulation of gardening activities. Continue to progres VOR and balance with narrow BOS.  EC. gait outdoors on unlevel surfaces, include inclines.    Consulted and Agree with Plan of Care Patient             Patient will benefit from skilled therapeutic  intervention in order to improve the following deficits and impairments:  Abnormal gait, Decreased balance, Difficulty walking, Impaired perceived functional ability, Other (comment) (disequilibrium)  Visit Diagnosis: Unsteadiness on feet  Difficulty in walking, not elsewhere classified  Repeated falls     Problem List Patient Active Problem List   Diagnosis Date Noted   Atypical atrial flutter ([place]) 11/13/2020   Persistent atrial fibrillation ([place]) 06/19/2020   Atrial fibrillation with RVR ([place]) 05/21/2020   Balance problems 05/21/2020   Hyperlipidemia 04/03/2018   GERD (gastroesophageal reflux disease) 03/31/2018    Rico Junker, PT 08/05/2021, 9:38 AM  [facility] [address] [place], [place], [address] Phone: [phone]   Fax:  [phone]  Name: Lisa Moore MRN: [id number] Date of Birth: 12-11-1950

## 2021-08-10 ENCOUNTER — Encounter: Payer: Self-pay | Admitting: Internal Medicine

## 2021-08-11 ENCOUNTER — Ambulatory Visit: Payer: Medicare Other | Admitting: Physical Therapy

## 2021-08-11 ENCOUNTER — Other Ambulatory Visit: Payer: Self-pay

## 2021-08-11 DIAGNOSIS — R262 Difficulty in walking, not elsewhere classified: Secondary | ICD-10-CM | POA: Diagnosis not present

## 2021-08-11 DIAGNOSIS — R296 Repeated falls: Secondary | ICD-10-CM

## 2021-08-11 DIAGNOSIS — R2681 Unsteadiness on feet: Secondary | ICD-10-CM

## 2021-08-11 NOTE — Therapy (Signed)
[facility] [address] [place], [place], [address] Phone: [phone]   Fax:  [phone]  Physical Therapy Treatment and 10th Visit Progress Note  Patient Details  Name: Lisa Moore MRN: [id number] Date of Birth: 1950/10/16 Referring Provider (PT): Blenda Nicely Henderson Cloud, MD   Encounter Date: 08/11/2021  Progress Note Reporting Period 07/08/2021 to 08/11/2021  See note below for Objective Data and Assessment of Progress/Goals.     PT End of Session - 08/11/21 1647     Visit Number 10    Number of Visits 17    Date for PT Re-Evaluation 09/06/21    Authorization Type Medicare    Progress Note Due on Visit 4    PT Start Time 1105    PT Stop Time 1150    PT Time Calculation (min) 45 min    Equipment Utilized During Treatment Other (comment)   pool noodle   Activity Tolerance Patient tolerated treatment well    Behavior During Therapy WFL for tasks assessed/performed             Past Medical History:  Diagnosis Date   Arthritis    Benign hematuria    GERD (gastroesophageal reflux disease)    Migraines    rare   Persistent atrial fibrillation ([place]) 05/21/2020   Typical atrial flutter ([place]) 11/13/2020    Past Surgical History:  Procedure Laterality Date   ABDOMINAL HYSTERECTOMY     APPENDECTOMY     ATRIAL FIBRILLATION ABLATION N/A 01/13/2021   Procedure: ATRIAL FIBRILLATION ABLATION;  Surgeon: Thompson Grayer, MD;  Location: [facility];  Service: Cardiovascular;  Laterality: N/A;   CARDIOVERSION N/A 07/07/2020   Procedure: CARDIOVERSION;  Surgeon: Josue Hector, MD;  Location: [facility] ENDOSCOPY;  Service: Cardiovascular;  Laterality: N/A;   CARDIOVERSION N/A 11/07/2020   Procedure: CARDIOVERSION;  Surgeon: Josue Hector, MD;  Location: [facility] ENDOSCOPY;  Service: Cardiovascular;  Laterality: N/A;    There were no vitals filed for this visit.   Subjective Assessment - 08/11/21 1529      Subjective Brought trekking pole in today but did not use it to walk in.  Back and hip are a little sore from decorating for Christmas.  Still nervous about the pool.    Pertinent History OA, Migraines, a-fib with cardioversion - pt elected to stop Eliquis    Limitations Standing;Walking;House hold activities    Diagnostic tests MRI in 2021: negative brain MRI for age, no acute abnormality    Patient Stated Goals To remain independent, prevent falls, be able to garden, to be able to get off the floor independently.    Currently in Pain? Yes    Pain Score 2     Pain Location Hip             Patient seen for aquatic therapy today.  Treatment took place in water 3-4 feet deep depending upon activity.  Pt entered and exited the pool via stairs with bilat rails.    Performed warm up with walking along wall in shallow end of pool, facing blank wall with UE support on wall: side stepping to L and R with head turns to L and R x 3 laps.  Also performed forwards marching with head turns to L and R with one UE support on wall and one on noodle, down and back x 3 laps. Continued balance training with tandem gait with one UE support on wall and head turns to L and R x  3 reps down and back.  Performed 90 deg turns, alternating between L and R turns with one UE support on wall x 5 reps to each side.  Progressed to 180 turns away from wall x 3 reps to each side, alternating between R and L.  Progressed to turns with UE support on noodle beginning with 180 deg turns to L and R > full 360 deg turns with noodle to L and R x 1 rep to each side.  Added in side stepping x 2 steps to L with L 180 turn x 4 sets and then switching to R side stepping with R 180 turn.  Progressed gait training with forward walking across width of shallow end of pool with PT in front of patient providing facilitation for reciprocal arm swing facilitation x 2 laps.  PT moved to behind patient and provided min A for balance with patient  performing reciprocal arm swing x 2 laps.  Pt requires buoyancy of water for support for joint offloading for pain reduction in hip and low back, to reduce fall risk with gait training and balance exercises with minimal UE support; exercises able to be performed safely in water without the risk of fall compared to those same exercises performed on land; viscosity of water needed for resistance for strengthening.  Current of water provides perturbations for challenging static & dynamic standing balance.    PT Short Term Goals - 08/03/21 1022       PT SHORT TERM GOAL #1   Title Pt will initiate balance HEP and will trial walking with walking stick to be able to return to walking program    Baseline reports independence with current HEP and use of walking stick for walking program    Time 4    Period Weeks    Status Achieved    Target Date 08/07/21      PT SHORT TERM GOAL #2   Title Pt will increase # of repetitions on 30 second sit to stand to 14 to indicate increased functional LE strength    Baseline 11, 14 sit <> stands without UE support    Time 4    Period Weeks    Status Achieved    Target Date 08/07/21      PT SHORT TERM GOAL #3   Title Pt will increase FGA by 5 points to indicate decreased falls risk with dynamic gait.    Baseline 3/30; 12/30    Time 4    Period Weeks    Status Achieved    Target Date 08/07/21      PT SHORT TERM GOAL #4   Title Pt will demonstrate improved sensory integration by performing MCTSIB: EO conditions for 30 seconds with minimal sway and EC conditions x 15 seconds with close supervision    Baseline All Four situation 30 seconds with supervision    Time 4    Period Weeks    Status Achieved    Target Date 08/07/21      PT SHORT TERM GOAL #5   Title Pt will demonstrate ability to perform floor <> stand transfer with use of UE and min A x 2 reps    Baseline able to complete floor <> stand with UE on mat x 2 reps with CGA    Time 4    Period  Weeks    Status Achieved    Target Date 08/07/21               PT  Long Term Goals - 07/16/21 0645       PT LONG TERM GOAL #1   Title Pt will demonstrate independence with final HEP, will return to walking program 2-3x/week with LRAD, and will return to chair yoga at [facility]    Time 8    Period Weeks    Status New      PT LONG TERM GOAL #2   Title Pt will demonstrate ability to stand from floor with use of UE x 3 reps MOD I    Time 8    Period Weeks    Status New      PT LONG TERM GOAL #3   Title Pt will increase FGA score to >/= 19/30 to indicate decreased falls risk    Time 8    Period Weeks    Status New      PT LONG TERM GOAL #4   Title Pt will perform MCTSIB conditions with EC x 20 seconds with supervision to improve safety/balance with washing hair and ambulating in low light conditions    Time 8    Period Weeks    Status New      PT LONG TERM GOAL #5   Title Pt will ambulate x 1000 over variety of surfaces, up/down curb, and across parking lot with LRAD, MOD I and perform visual scanning for safety without evidence of imbalance    Time 8    Period Weeks    Status New                   Plan - 08/11/21 1647     Clinical Impression Statement Pt is making good progress and has achieved all STG; pt has also initiated and completed 2 aquatic therapy sessions for vestibular and multi-sensory balance training.  Pt demonstrated progress today in aquatic environment and was able to progress away from UE support on the wall.  Pt able to perform 25% of the session with UE support on noodle only while performing dynamic balance activities and gait training.  Pt also able to begin shift gaze away from stable wall to more open pool area.  Pt has requested to continue with the pool.  Will continue to address in order to progress towards patient's LTG.    Personal Factors and Comorbidities Comorbidity 2;Past/Current Experience;Social Background    Examination-Activity  Limitations Bend;Locomotion Level;Stairs;Stand;Squat;Transfers    Examination-Participation Restrictions Cleaning;Community Activity;Yard Work    Merchant navy officer Evolving/Moderate complexity    Rehab Potential Good    PT Frequency 2x / week    PT Duration 8 weeks    PT Treatment/Interventions ADLs/Self Care Home Management;Aquatic Therapy;Canalith Repostioning;DME Instruction;Gait training;Stair training;Functional mobility training;Therapeutic activities;Therapeutic exercise;Balance training;Neuromuscular re-education;Patient/family education;Vestibular    PT Next Visit Plan Aquatic-hip strengthening, step ups with head turns, balance while walking, balance on noodle.  Floor transfers.  Then, add simulation of gardening activities. Continue to progres VOR and balance with narrow BOS.  EC. gait outdoors on unlevel surfaces, include inclines.    Consulted and Agree with Plan of Care Patient             Patient will benefit from skilled therapeutic intervention in order to improve the following deficits and impairments:  Abnormal gait, Decreased balance, Difficulty walking, Impaired perceived functional ability, Other (comment) (disequilibrium)  Visit Diagnosis: Unsteadiness on feet  Difficulty in walking, not elsewhere classified  Repeated falls     Problem List Patient Active Problem List   Diagnosis Date Noted  Atypical atrial flutter ([place]) 11/13/2020   Persistent atrial fibrillation ([place]) 06/19/2020   Atrial fibrillation with RVR ([place]) 05/21/2020   Balance problems 05/21/2020   Hyperlipidemia 04/03/2018   GERD (gastroesophageal reflux disease) 03/31/2018   Rico Junker, PT, DPT 08/12/21    9:15 AM    [facility] [address] [place], [place], [address] Phone: [phone]   Fax:  [phone]  Name: Lisa Moore MRN: [id number] Date of Birth: 08/08/1951

## 2021-08-13 ENCOUNTER — Encounter: Payer: Medicare Other | Admitting: Physical Therapy

## 2021-08-14 ENCOUNTER — Ambulatory Visit: Payer: Medicare Other | Attending: Otolaryngology | Admitting: Physical Therapy

## 2021-08-14 ENCOUNTER — Other Ambulatory Visit: Payer: Self-pay

## 2021-08-14 DIAGNOSIS — R2681 Unsteadiness on feet: Secondary | ICD-10-CM | POA: Diagnosis not present

## 2021-08-14 DIAGNOSIS — R296 Repeated falls: Secondary | ICD-10-CM | POA: Diagnosis not present

## 2021-08-14 DIAGNOSIS — R262 Difficulty in walking, not elsewhere classified: Secondary | ICD-10-CM | POA: Diagnosis not present

## 2021-08-14 NOTE — Therapy (Signed)
[facility] [address] [place], [place], [address] Phone: [phone]   Fax:  [phone]  Physical Therapy Treatment  Patient Details  Name: Lisa Moore MRN: [id number] Date of Birth: 1950-12-21 Referring Provider (PT): Blenda Nicely, Henderson Cloud, MD   Encounter Date: 08/14/2021   PT End of Session - 08/14/21 1643     Visit Number 11    Number of Visits 17    Date for PT Re-Evaluation 09/06/21    Authorization Type Medicare    Progress Note Due on Visit 80    PT Start Time 0850    PT Stop Time 0932    PT Time Calculation (min) 42 min    Activity Tolerance Patient tolerated treatment well    Behavior During Therapy [facility] for tasks assessed/performed             Past Medical History:  Diagnosis Date   Arthritis    Benign hematuria    GERD (gastroesophageal reflux disease)    Migraines    rare   Persistent atrial fibrillation ([place]) 05/21/2020   Typical atrial flutter ([place]) 11/13/2020    Past Surgical History:  Procedure Laterality Date   ABDOMINAL HYSTERECTOMY     APPENDECTOMY     ATRIAL FIBRILLATION ABLATION N/A 01/13/2021   Procedure: ATRIAL FIBRILLATION ABLATION;  Surgeon: Thompson Grayer, MD;  Location: [facility];  Service: Cardiovascular;  Laterality: N/A;   CARDIOVERSION N/A 07/07/2020   Procedure: CARDIOVERSION;  Surgeon: Josue Hector, MD;  Location: [facility] ENDOSCOPY;  Service: Cardiovascular;  Laterality: N/A;   CARDIOVERSION N/A 11/07/2020   Procedure: CARDIOVERSION;  Surgeon: Josue Hector, MD;  Location: [facility] ENDOSCOPY;  Service: Cardiovascular;  Laterality: N/A;    There were no vitals filed for this visit.   Subjective Assessment - 08/14/21 0854     Subjective Hip still a little sore.  After Aquatic on Tuesday pt went into [employer], did shopping and loaded items into car and then realized she didn't use her trekking pole or the shopping cart for support!  Has been  practicing getting on her knees on low padded stool but has a hard time getting up.    Pertinent History OA, Migraines, a-fib with cardioversion - pt elected to stop Eliquis    Limitations Standing;Walking;House hold activities    Diagnostic tests MRI in 2021: negative brain MRI for age, no acute abnormality    Patient Stated Goals To remain independent, prevent falls, be able to garden, to be able to get off the floor independently.    Currently in Pain? Yes               [facility] Adult PT Treatment/Exercise - 08/14/21 0857       Transfers   Transfers Floor to Transfer;Sit to Stand;Stand to Sit    Sit to Stand 5: Supervision;4: Min guard    Sit to Stand Details (indicate cue type and reason) with feet close together on double blue mat, compliant - 5 reps EO, no UE support > 5 reps EC, no UE support but min A.  With EO performed sit > stand, no UE with alternating step forwards and back with each LE x 5 reps each side.  Performed sit <> stand with alternating step and head turn to ipsilateral side x5 reps each side.  Min A due to impaired balance with head turns.    Floor to Transfer 4: Min guard    Floor to Transfer Details (indicate  cue type and reason) from tall kneeling >half kneeling on RLE pushing to stand with UE support on mat and then with one UE on mat and one on knee with weight shift forwards to stand.  3 reps pushing up with RLE progressing to supervision.  Attempted pushing with LLE, min A             Vestibular Treatment/Exercise - 08/14/21 0917       Vestibular Treatment/Exercise   Vestibular Treatment Provided Gaze    Gaze Exercises X1 Viewing Horizontal;X1 Viewing Vertical      X1 Viewing Horizontal   Foot Position feet apart > feet together on solid surface, busy background    Reps 3    Comments 30 seconds, no symptoms until feet together      X1 Viewing Vertical   Foot Position feet apart > feet together on solid surface, busy background    Reps 3     Comments 30 seconds, no symptoms until feet together               PT Education - 08/14/21 1641     Education Details update to VOR - changed to busy background; pt would like to return to the [facility] but is concerned about COVID/Flu - educated pt on online Silver Sneakers exercises videos.    Person(s) Educated Patient    Methods Explanation;Demonstration;Handout    Comprehension Verbalized understanding;Returned demonstration              PT Short Term Goals - 08/03/21 1022       PT SHORT TERM GOAL #1   Title Pt will initiate balance HEP and will trial walking with walking stick to be able to return to walking program    Baseline reports independence with current HEP and use of walking stick for walking program    Time 4    Period Weeks    Status Achieved    Target Date 08/07/21      PT SHORT TERM GOAL #2   Title Pt will increase # of repetitions on 30 second sit to stand to 14 to indicate increased functional LE strength    Baseline 11, 14 sit <> stands without UE support    Time 4    Period Weeks    Status Achieved    Target Date 08/07/21      PT SHORT TERM GOAL #3   Title Pt will increase FGA by 5 points to indicate decreased falls risk with dynamic gait.    Baseline 3/30; 12/30    Time 4    Period Weeks    Status Achieved    Target Date 08/07/21      PT SHORT TERM GOAL #4   Title Pt will demonstrate improved sensory integration by performing MCTSIB: EO conditions for 30 seconds with minimal sway and EC conditions x 15 seconds with close supervision    Baseline All Four situation 30 seconds with supervision    Time 4    Period Weeks    Status Achieved    Target Date 08/07/21      PT SHORT TERM GOAL #5   Title Pt will demonstrate ability to perform floor <> stand transfer with use of UE and min A x 2 reps    Baseline able to complete floor <> stand with UE on mat x 2 reps with CGA    Time 4    Period Weeks    Status Achieved    Target Date  08/07/21                PT Long Term Goals - 07/16/21 0645       PT LONG TERM GOAL #1   Title Pt will demonstrate independence with final HEP, will return to walking program 2-3x/week with LRAD, and will return to chair yoga at [facility]    Time 8    Period Weeks    Status New      PT LONG TERM GOAL #2   Title Pt will demonstrate ability to stand from floor with use of UE x 3 reps MOD I    Time 8    Period Weeks    Status New      PT LONG TERM GOAL #3   Title Pt will increase FGA score to >/= 19/30 to indicate decreased falls risk    Time 8    Period Weeks    Status New      PT LONG TERM GOAL #4   Title Pt will perform MCTSIB conditions with EC x 20 seconds with supervision to improve safety/balance with washing hair and ambulating in low light conditions    Time 8    Period Weeks    Status New      PT LONG TERM GOAL #5   Title Pt will ambulate x 1000 over variety of surfaces, up/down curb, and across parking lot with LRAD, MOD I and perform visual scanning for safety without evidence of imbalance    Time 8    Period Weeks    Status New                   Plan - 08/14/21 1644     Clinical Impression Statement Pt continues to demonstrate progress and was able to progress to busy background on VOR with only mild symptoms.  Pt is also demonstrating decreased reliance on trekking pole and UE support for stability and has demonstrated ability to transfer onto and off floor with UE support without assistance from therapist.  Will continue to address and progress towards LTG.    Personal Factors and Comorbidities Comorbidity 2;Past/Current Experience;Social Background    Examination-Activity Limitations Bend;Locomotion Level;Stairs;Stand;Squat;Transfers    Examination-Participation Restrictions Cleaning;Community Activity;Yard Work    Conservation officer, historic buildings Evolving/Moderate complexity    Rehab Potential Good    PT Frequency 2x / week    PT Duration 8 weeks    PT  Treatment/Interventions ADLs/Self Care Home Management;Aquatic Therapy;Canalith Repostioning;DME Instruction;Gait training;Stair training;Functional mobility training;Therapeutic activities;Therapeutic exercise;Balance training;Neuromuscular re-education;Patient/family education;Vestibular    PT Next Visit Plan Aquatic-hip strengthening, step ups with head turns, balance while walking, balance on noodle.  LAND: progress time to 60 seconds on VOR/feet together/busy background.  Continue balance training with EC. gait outdoors on unlevel surfaces, include inclines.    Consulted and Agree with Plan of Care Patient             Patient will benefit from skilled therapeutic intervention in order to improve the following deficits and impairments:  Abnormal gait, Decreased balance, Difficulty walking, Impaired perceived functional ability, Other (comment) (disequilibrium)  Visit Diagnosis: Unsteadiness on feet  Difficulty in walking, not elsewhere classified  Repeated falls     Problem List Patient Active Problem List   Diagnosis Date Noted   Atypical atrial flutter ([facility]) 11/13/2020   Persistent atrial fibrillation ([facility]) 06/19/2020   Atrial fibrillation with RVR ([facility]) 05/21/2020   Balance problems 05/21/2020   Hyperlipidemia 04/03/2018   GERD (gastroesophageal reflux  disease) 03/31/2018    Rico Junker, PT, DPT 08/14/21    4:49 PM    [facility] [address] [place], [place], [address] Phone: [phone]   Fax:  [phone]  Name: Lisa Moore MRN: [id number] Date of Birth: 12-12-1950

## 2021-08-14 NOTE — Patient Instructions (Signed)
Gaze Stabilization: Standing Feet Together    Put letter on busy background (wrapping paper).  Feet together, keeping eyes on target on wall ___3_ feet away, tilt head down 15-30 and move head side to side for _30___ seconds. Repeat while moving head up and down for ___30_ seconds. Do __2-3__ sessions per day.   Bridge Pose, One Leg    Start with both feet on bed.  Squeeze your butt muscles, lift your hips. Keep the hips lifted while you lift one foot up.  Foot down, hips down. Repeat, lifting the other foot. Repeat __5__ times each leg.    Hip Abduction: Modified    Lying on right side, right knee and hip bent.  Top leg out straight. Lift the top leg up towards the ceiling (doesn't have to be too high), keeping the pelvis rolled forwards but keep the leg from drifting forwards.   Repeat __10__ times per side.    Hip Abduction: Side-Stepping: Monster Steps (Eccentric)    Band around thighs.  Keeping the feet forwards, sit in a small squat and perform side steps against the resistance of the band to the right and then switch and step back to the left.  4 laps down and back, __2_ sets per day.     Feet Heel-Toe "Tandem"    Arms outstretched, walk a straight line bringing one foot directly in front of the other. Repeat for 4 laps per session. Do __2__ sessions per day.   Marching In-Place     Standing straight, alternate marching in place. Let arms swing naturally. Once you start marching, turn your head to the right for 3 marches, middle for 3 marches, left for 3 marches.   Keep changing between R, middle, L, middle for 5 repetitions.  Stop if you feel really off balance. Do _1-2__ times per day.   Feet Together (Compliant Surface) Head Motion - Eyes Closed    Stand on compliant surface: pillow with feet CLOSE together, but not touching. Close eyes and move head slowly, side to side, 5 times. Rest Close eyes and move head slowly, up and down, 5  times Repeat 2 times per session. Do 2 sessions per day.

## 2021-08-20 ENCOUNTER — Ambulatory Visit: Payer: Medicare Other | Admitting: Physical Therapy

## 2021-08-20 ENCOUNTER — Other Ambulatory Visit: Payer: Self-pay

## 2021-08-20 DIAGNOSIS — R262 Difficulty in walking, not elsewhere classified: Secondary | ICD-10-CM

## 2021-08-20 DIAGNOSIS — R296 Repeated falls: Secondary | ICD-10-CM

## 2021-08-20 DIAGNOSIS — R2681 Unsteadiness on feet: Secondary | ICD-10-CM

## 2021-08-20 NOTE — Therapy (Signed)
[facility] [address] [place], [place], [address] Phone: [phone]   Fax:  [phone]  Physical Therapy Treatment  Patient Details  Name: Lisa Moore MRN: [id number] Date of Birth: 1951-05-21 Referring Provider (PT): Blenda Nicely, Henderson Cloud, MD   Encounter Date: 08/20/2021   PT End of Session - 08/20/21 1151     Visit Number 12    Number of Visits 17    Date for PT Re-Evaluation 09/06/21    Authorization Type Medicare    Progress Note Due on Visit 101    PT Start Time 0853    PT Stop Time 0934    PT Time Calculation (min) 41 min    Activity Tolerance Patient tolerated treatment well    Behavior During Therapy [facility] for tasks assessed/performed             Past Medical History:  Diagnosis Date   Arthritis    Benign hematuria    GERD (gastroesophageal reflux disease)    Migraines    rare   Persistent atrial fibrillation ([place]) 05/21/2020   Typical atrial flutter ([place]) 11/13/2020    Past Surgical History:  Procedure Laterality Date   ABDOMINAL HYSTERECTOMY     APPENDECTOMY     ATRIAL FIBRILLATION ABLATION N/A 01/13/2021   Procedure: ATRIAL FIBRILLATION ABLATION;  Surgeon: Thompson Grayer, MD;  Location: [facility];  Service: Cardiovascular;  Laterality: N/A;   CARDIOVERSION N/A 07/07/2020   Procedure: CARDIOVERSION;  Surgeon: Josue Hector, MD;  Location: [facility] ENDOSCOPY;  Service: Cardiovascular;  Laterality: N/A;   CARDIOVERSION N/A 11/07/2020   Procedure: CARDIOVERSION;  Surgeon: Josue Hector, MD;  Location: [facility] ENDOSCOPY;  Service: Cardiovascular;  Laterality: N/A;    There were no vitals filed for this visit.   Subjective Assessment - 08/20/21 0853     Subjective Dizziness is good, has had a few intermittent spells.  Is still working on getting up and down from the floor, easier to get up pushing from the side vs. forwards.    Pertinent History OA, Migraines, a-fib with cardioversion - pt  elected to stop Eliquis    Limitations Standing;Walking;House hold activities    Diagnostic tests MRI in 2021: negative brain MRI for age, no acute abnormality    Patient Stated Goals To remain independent, prevent falls, be able to garden, to be able to get off the floor independently.    Currently in Pain? Yes              Vestibular Treatment/Exercise - 08/20/21 0900       Vestibular Treatment/Exercise   Vestibular Treatment Provided Gaze    Gaze Exercises X1 Viewing Horizontal;X1 Viewing Vertical      X1 Viewing Horizontal   Foot Position feet apart on pillow, busy background > progressed to feet together    Reps 2    Comments 60 seconds, mild symptoms      X1 Viewing Vertical   Foot Position feet apart on pillow, busy background > progressed to feet together    Reps 2    Comments 60 seconds, mild symptoms                Balance Exercises - 08/20/21 0920       Balance Exercises: Standing   Stepping Strategy Anterior;Posterior;Foam/compliant surface;UE support;5 reps;Limitations    Stepping Strategy Limitations Performed stepping up and then back down from foam, 5 reps leading with each LE, min A for balance.    Gait  with Head Turns Forward;Retro;4 reps;Limitations    Gait with Head Turns Limitations Walking forwards at fast pace looking to the L and R to pass ball back and forth to PT.  Also performed side stepping to L and R while performing vertical head movements to pass ball back and forth to PT.                PT Education - 08/20/21 1148     Education Details updated HEP, plan for more visits in new year aquatic and land    Person(s) Educated Patient    Methods Explanation;Demonstration;Handout    Comprehension Verbalized understanding;Returned demonstration              PT Short Term Goals - 08/03/21 1022       PT SHORT TERM GOAL #1   Title Pt will initiate balance HEP and will trial walking with walking stick to be able to return to  walking program    Baseline reports independence with current HEP and use of walking stick for walking program    Time 4    Period Weeks    Status Achieved    Target Date 08/07/21      PT SHORT TERM GOAL #2   Title Pt will increase # of repetitions on 30 second sit to stand to 14 to indicate increased functional LE strength    Baseline 11, 14 sit <> stands without UE support    Time 4    Period Weeks    Status Achieved    Target Date 08/07/21      PT SHORT TERM GOAL #3   Title Pt will increase FGA by 5 points to indicate decreased falls risk with dynamic gait.    Baseline 3/30; 12/30    Time 4    Period Weeks    Status Achieved    Target Date 08/07/21      PT SHORT TERM GOAL #4   Title Pt will demonstrate improved sensory integration by performing MCTSIB: EO conditions for 30 seconds with minimal sway and EC conditions x 15 seconds with close supervision    Baseline All Four situation 30 seconds with supervision    Time 4    Period Weeks    Status Achieved    Target Date 08/07/21      PT SHORT TERM GOAL #5   Title Pt will demonstrate ability to perform floor <> stand transfer with use of UE and min A x 2 reps    Baseline able to complete floor <> stand with UE on mat x 2 reps with CGA    Time 4    Period Weeks    Status Achieved    Target Date 08/07/21               PT Long Term Goals - 07/16/21 0645       PT LONG TERM GOAL #1   Title Pt will demonstrate independence with final HEP, will return to walking program 2-3x/week with LRAD, and will return to chair yoga at [facility]    Time 8    Period Weeks    Status New      PT LONG TERM GOAL #2   Title Pt will demonstrate ability to stand from floor with use of UE x 3 reps MOD I    Time 8    Period Weeks    Status New      PT LONG TERM GOAL #3   Title Pt  will increase FGA score to >/= 19/30 to indicate decreased falls risk    Time 8    Period Weeks    Status New      PT LONG TERM GOAL #4   Title Pt will  perform MCTSIB conditions with EC x 20 seconds with supervision to improve safety/balance with washing hair and ambulating in low light conditions    Time 8    Period Weeks    Status New      PT LONG TERM GOAL #5   Title Pt will ambulate x 1000 over variety of surfaces, up/down curb, and across parking lot with LRAD, MOD I and perform visual scanning for safety without evidence of imbalance    Time 8    Period Weeks    Status New                   Plan - 08/20/21 1151     Clinical Impression Statement Continued to progress patient's VOR by increasing time and balance challenge in standing; also progressed marching to more functional stepping on compliant surface but pt continued to require significant UE support for safety to maintain balance.  Pt also is tolerating faster walking and in different directions with functional head turns with minimal symptoms.  Pt reports improved confidence when ambulating in her house and outside.  Will continue to address and progress towards LTG.    Personal Factors and Comorbidities Comorbidity 2;Past/Current Experience;Social Background    Examination-Activity Limitations Bend;Locomotion Level;Stairs;Stand;Squat;Transfers    Examination-Participation Restrictions Cleaning;Community Activity;Yard Work    Merchant navy officer Evolving/Moderate complexity    Rehab Potential Good    PT Frequency 2x / week    PT Duration 8 weeks    PT Treatment/Interventions ADLs/Self Care Home Management;Aquatic Therapy;Canalith Repostioning;DME Instruction;Gait training;Stair training;Functional mobility training;Therapeutic activities;Therapeutic exercise;Balance training;Neuromuscular re-education;Patient/family education;Vestibular    PT Next Visit Plan Aquatic-hip strengthening, step ups with head turns, balance while walking, balance on noodle.  LAND: Did she try the virtual courses for silver sneakers? Continue balance training with EC. gait  outdoors on unlevel surfaces, include inclines.    Consulted and Agree with Plan of Care Patient             Patient will benefit from skilled therapeutic intervention in order to improve the following deficits and impairments:  Abnormal gait, Decreased balance, Difficulty walking, Impaired perceived functional ability, Other (comment) (disequilibrium)  Visit Diagnosis: Unsteadiness on feet  Difficulty in walking, not elsewhere classified  Repeated falls     Problem List Patient Active Problem List   Diagnosis Date Noted   Atypical atrial flutter ([place]) 11/13/2020   Persistent atrial fibrillation ([place]) 06/19/2020   Atrial fibrillation with RVR ([place]) 05/21/2020   Balance problems 05/21/2020   Hyperlipidemia 04/03/2018   GERD (gastroesophageal reflux disease) 03/31/2018    Rico Junker, PT, DPT 08/20/21    12:07 PM   [facility] [address] [place], [place], [address] Phone: [phone]   Fax:  [phone]  Name: Lisa Moore MRN: [id number] Date of Birth: Sep 29, 1950

## 2021-08-20 NOTE — Patient Instructions (Addendum)
Gaze Stabilization: Standing Feet Together (Compliant Surface)    Busy background (wrapping paper).   Feet together on pillow, keeping eyes on target on wall __3__ feet away, tilt head down 15-30 and move head side to side for ___60_ seconds.  Repeat while moving head up and down for _60___ seconds. Do __2-3__ sessions per day.   Bridge Pose, One Leg    Start with both feet on bed.  Squeeze your butt muscles, lift your hips. Keep the hips lifted while you lift one foot up.  Foot down, hips down. Repeat, lifting the other foot. Repeat __5__ times each leg.    Hip Abduction: Modified    Lying on right side, right knee and hip bent.  Top leg out straight. Lift the top leg up towards the ceiling (doesn't have to be too high), keeping the pelvis rolled forwards but keep the leg from drifting forwards.   Repeat __10__ times per side.   Hip Abduction: Side-Stepping: Monster Steps (Eccentric)    Band around thighs.  Keeping the feet forwards, sit in a small squat and perform side steps against the resistance of the band to the right and then switch and step back to the left.  4 laps down and back, __2_ sets per day.    Feet Heel-Toe "Tandem"    Arms outstretched, walk a straight line bringing one foot directly in front of the other. Repeat for 4 laps per session. Do __2__ sessions per day.   Step-Up: Forward <> Backward on Pillow    Standing in the corner, pillow in front of you and chair for support. High Step forward on to the pillow with your right foot, followed with left.  Step back and down off the pillow.  Repeat 5 times leading with right leg. Switch and lead with the left leg, 5 times.     Feet Together (Compliant Surface) Head Motion - Eyes Closed    Stand on compliant surface: pillow with feet CLOSE together, but not touching. Close eyes and move head slowly, side to side, 5 times. Rest Close eyes and move head slowly, up and down, 5 times Repeat 2  times per session. Do 2 sessions per day.

## 2021-08-21 ENCOUNTER — Ambulatory Visit: Payer: Medicare Other | Admitting: Physical Therapy

## 2021-08-21 DIAGNOSIS — R296 Repeated falls: Secondary | ICD-10-CM

## 2021-08-21 DIAGNOSIS — R262 Difficulty in walking, not elsewhere classified: Secondary | ICD-10-CM

## 2021-08-21 DIAGNOSIS — R2681 Unsteadiness on feet: Secondary | ICD-10-CM

## 2021-08-21 NOTE — Therapy (Signed)
[facility] [address] [place], [place], [address] Phone: [phone]   Fax:  [phone]  Physical Therapy Treatment  Patient Details  Name: Lisa Moore MRN: [id number] Date of Birth: [birth year] Referring Provider (PT): Blenda Nicely, Henderson Cloud, MD   Encounter Date: 08/21/2021   PT End of Session - 08/21/21 0934     Visit Number 13    Number of Visits 17    Date for PT Re-Evaluation 09/06/21    Authorization Type Medicare    Progress Note Due on Visit 63    PT Start Time 0852    PT Stop Time 0932    PT Time Calculation (min) 40 min    Activity Tolerance Patient tolerated treatment well    Behavior During Therapy [facility] for tasks assessed/performed             Past Medical History:  Diagnosis Date   Arthritis    Benign hematuria    GERD (gastroesophageal reflux disease)    Migraines    rare   Persistent atrial fibrillation ([place]) 05/21/2020   Typical atrial flutter ([place]) 11/13/2020    Past Surgical History:  Procedure Laterality Date   ABDOMINAL HYSTERECTOMY     APPENDECTOMY     ATRIAL FIBRILLATION ABLATION N/A 01/13/2021   Procedure: ATRIAL FIBRILLATION ABLATION;  Surgeon: Thompson Grayer, MD;  Location: [place] [facility];  Service: Cardiovascular;  Laterality: N/A;   CARDIOVERSION N/A 07/07/2020   Procedure: CARDIOVERSION;  Surgeon: Josue Hector, MD;  Location: [facility] ENDOSCOPY;  Service: Cardiovascular;  Laterality: N/A;   CARDIOVERSION N/A 11/07/2020   Procedure: CARDIOVERSION;  Surgeon: Josue Hector, MD;  Location: [facility] ENDOSCOPY;  Service: Cardiovascular;  Laterality: N/A;    There were no vitals filed for this visit.   Subjective Assessment - 08/21/21 0854     Subjective Back is a little sore today but walked a lot yesterday.  No other issues.  Did look up online chair yoga class; similar to what she was doing at the [facility].    Pertinent History OA, Migraines, a-fib with cardioversion - pt elected  to stop Eliquis    Limitations Standing;Walking;House hold activities    Diagnostic tests MRI in 2021: negative brain MRI for age, no acute abnormality    Patient Stated Goals To remain independent, prevent falls, be able to garden, to be able to get off the floor independently.    Currently in Pain? Yes                [facility] Adult PT Treatment/Exercise - 08/21/21 [id number]       Ambulation/Gait   Ramp 4: Min assist    Ramp Details (indicate cue type and reason) Balance standing on solid ramp, facing up the ramp feet apart and then feet together, EO and then EC with 10 reps each head turns and head nods with min A; mild imbalance with EC and feet together.  Also facing up and down ramp performed stepping forwards and back in place x 10 reps with R and LLE.  Finished with walking up and down ramp focusing on maintaining stride length and keeping gaze forwards, performed x 4 reps with pt still relying on looking down and taking shorter steps when descending ramp.               PT Short Term Goals - 08/03/21 1022       PT SHORT TERM GOAL #1   Title Pt will initiate balance HEP  and will trial walking with walking stick to be able to return to walking program    Baseline reports independence with current HEP and use of walking stick for walking program    Time 4    Period Weeks    Status Achieved    Target Date 08/07/21      PT SHORT TERM GOAL #2   Title Pt will increase # of repetitions on 30 second sit to stand to 14 to indicate increased functional LE strength    Baseline 11, 14 sit <> stands without UE support    Time 4    Period Weeks    Status Achieved    Target Date 08/07/21      PT SHORT TERM GOAL #3   Title Pt will increase FGA by 5 points to indicate decreased falls risk with dynamic gait.    Baseline 3/30; 12/30    Time 4    Period Weeks    Status Achieved    Target Date 08/07/21      PT SHORT TERM GOAL #4   Title Pt will demonstrate improved sensory integration  by performing MCTSIB: EO conditions for 30 seconds with minimal sway and EC conditions x 15 seconds with close supervision    Baseline All Four situation 30 seconds with supervision    Time 4    Period Weeks    Status Achieved    Target Date 08/07/21      PT SHORT TERM GOAL #5   Title Pt will demonstrate ability to perform floor <> stand transfer with use of UE and min A x 2 reps    Baseline able to complete floor <> stand with UE on mat x 2 reps with CGA    Time 4    Period Weeks    Status Achieved    Target Date 08/07/21               PT Long Term Goals - 07/16/21 0645       PT LONG TERM GOAL #1   Title Pt will demonstrate independence with final HEP, will return to walking program 2-3x/week with LRAD, and will return to chair yoga at [facility]    Time 8    Period Weeks    Status New      PT LONG TERM GOAL #2   Title Pt will demonstrate ability to stand from floor with use of UE x 3 reps MOD I    Time 8    Period Weeks    Status New      PT LONG TERM GOAL #3   Title Pt will increase FGA score to >/= 19/30 to indicate decreased falls risk    Time 8    Period Weeks    Status New      PT LONG TERM GOAL #4   Title Pt will perform MCTSIB conditions with EC x 20 seconds with supervision to improve safety/balance with washing hair and ambulating in low light conditions    Time 8    Period Weeks    Status New      PT LONG TERM GOAL #5   Title Pt will ambulate x 1000 over variety of surfaces, up/down curb, and across parking lot with LRAD, MOD I and perform visual scanning for safety without evidence of imbalance    Time 8    Period Weeks    Status New             Plan -  08/21/21 0934     Clinical Impression Statement Treatment session today focused solely on static and dynamic standing balance on inclines; pt demonstrating improved static balance on uneven surface with head turns and even with vision removed.  Still having greatest difficulty with dynamic balance on  uneven surfaces.  Will continue to address and progress towards LTG.    Personal Factors and Comorbidities Comorbidity 2;Past/Current Experience;Social Background    Examination-Activity Limitations Bend;Locomotion Level;Stairs;Stand;Squat;Transfers    Examination-Participation Restrictions Cleaning;Community Activity;Yard Work    Merchant [employer] officer Evolving/Moderate complexity    Rehab Potential Good    PT Frequency 2x / week    PT Duration 8 weeks    PT Treatment/Interventions ADLs/Self Care Home Management;Aquatic Therapy;Canalith Repostioning;DME Instruction;Gait training;Stair training;Functional mobility training;Therapeutic activities;Therapeutic exercise;Balance training;Neuromuscular re-education;Patient/family education;Vestibular    PT Next Visit Plan Aquatic-hip strengthening, step ups with head turns, balance while walking, balance on noodle.  LAND: up/down ramp without breaking stride.  Ramp with mat over it.  Continue balance training with EC. gait outdoors on unlevel surfaces, include inclines.    Consulted and Agree with Plan of Care Patient             Patient will benefit from skilled therapeutic intervention in order to improve the following deficits and impairments:  Abnormal gait, Decreased balance, Difficulty walking, Impaired perceived functional ability, Other (comment) (disequilibrium)  Visit Diagnosis: Unsteadiness on feet  Difficulty in walking, not elsewhere classified  Repeated falls     Problem List Patient Active Problem List   Diagnosis Date Noted   Atypical atrial flutter ([place]) 11/13/2020   Persistent atrial fibrillation ([place]) 06/19/2020   Atrial fibrillation with RVR ([place]) 05/21/2020   Balance problems 05/21/2020   Hyperlipidemia 04/03/2018   GERD (gastroesophageal reflux disease) 03/31/2018   Rico Junker, PT, DPT 08/21/21    9:37 AM    [facility] [address]  [place], [place], [address] Phone: [phone]   Fax:  [phone]  Name: Lisa Moore MRN: [id number] Date of Birth: 12-Dec-1950

## 2021-08-25 ENCOUNTER — Other Ambulatory Visit: Payer: Self-pay

## 2021-08-25 ENCOUNTER — Ambulatory Visit: Payer: Medicare Other | Admitting: Physical Therapy

## 2021-08-25 DIAGNOSIS — R262 Difficulty in walking, not elsewhere classified: Secondary | ICD-10-CM

## 2021-08-25 DIAGNOSIS — R296 Repeated falls: Secondary | ICD-10-CM

## 2021-08-25 DIAGNOSIS — R2681 Unsteadiness on feet: Secondary | ICD-10-CM | POA: Diagnosis not present

## 2021-08-25 NOTE — Therapy (Signed)
[facility] [address] [place], [place], [address] Phone: [phone]   Fax:  [phone]  Physical Therapy Treatment  Patient Details  Name: Brunilda Montierth Peri MRN: [id number] Date of Birth: 11/05/1950 Referring Provider (PT): Blenda Nicely, Henderson Cloud, MD   Encounter Date: 08/25/2021   PT End of Session - 08/25/21 1356     Visit Number 14    Number of Visits 17    Date for PT Re-Evaluation 09/06/21    Authorization Type Medicare    Progress Note Due on Visit 29    PT Start Time 1105    PT Stop Time 1145    PT Time Calculation (min) 40 min    Equipment Utilized During Treatment Other (comment)   pool noodles   Activity Tolerance Patient tolerated treatment well    Behavior During Therapy [facility] for tasks assessed/performed             Past Medical History:  Diagnosis Date   Arthritis    Benign hematuria    GERD (gastroesophageal reflux disease)    Migraines    rare   Persistent atrial fibrillation ([place]) 05/21/2020   Typical atrial flutter ([place]) 11/13/2020    Past Surgical History:  Procedure Laterality Date   ABDOMINAL HYSTERECTOMY     APPENDECTOMY     ATRIAL FIBRILLATION ABLATION N/A 01/13/2021   Procedure: ATRIAL FIBRILLATION ABLATION;  Surgeon: Thompson Grayer, MD;  Location: [place] [facility];  Service: Cardiovascular;  Laterality: N/A;   CARDIOVERSION N/A 07/07/2020   Procedure: CARDIOVERSION;  Surgeon: Josue Hector, MD;  Location: [facility] ENDOSCOPY;  Service: Cardiovascular;  Laterality: N/A;   CARDIOVERSION N/A 11/07/2020   Procedure: CARDIOVERSION;  Surgeon: Josue Hector, MD;  Location: [facility] ENDOSCOPY;  Service: Cardiovascular;  Laterality: N/A;    There were no vitals filed for this visit.   Subjective Assessment - 08/25/21 1352     Subjective Back and hip are about a 2/10 today.  Feels like she has "gotten her life back" because she can walk again with confidence.    Pertinent History OA,  Migraines, a-fib with cardioversion - pt elected to stop Eliquis    Limitations Standing;Walking;House hold activities    Diagnostic tests MRI in 2021: negative brain MRI for age, no acute abnormality    Patient Stated Goals To remain independent, prevent falls, be able to garden, to be able to get off the floor independently.    Currently in Pain? Yes    Pain Score 2     Pain Location Hip             Patient seen for aquatic therapy today.  Treatment took place in water 3-4 feet deep depending upon activity.  Pt entered and exited the pool via stairs with step to sequencing and UE support on railing  Balance and vestibular training in aquatic environment consisted of: Walking across width of pool in 3.6 feet of water with UE support on noodle forwards x 3 reps, backwards x 3 reps, and R/L sideways x 2 to each side with therapist providing min guard and verbal cues for increased step and stride length.  Focused on moving vision away from blank wall to more visually challenging environment of water and light reflection off water. Tandem gait attempted holding noodle for UE support but pt unable to perform. At stairs with UE support on railing for balance only - cues to avoid pulling up with UE: performed 8 reps single leg step ups  on bottom step with contralateral LE tap to next step - performed with each LE; also focused on placing foot on bottom step and performing multiple weight shifts forwards over stance LE and then initiating step up without pulling with UE Stood in corner of pool with feet apart and feet touching pool wall: Performed 10 reps head turns and nods with EO, feet wide progressing to EC with feet wide and then with feet together.  Progressed to standing away from wall without feet touching wall and performed head turns and head nods with EC.  Pt able to perform without UE support and with supervision from therapist and no significant LOB. Attempted to perform sitting balance  seated on noodle with UE and feet floating but pt unable to tolerate.    Pt slightly off balance when exiting pool but was able to ambulate to locker room without use of trekking pole.  Pt requires buoyancy of water for support for joint offloading for pain reduction in hip, to reduce fall risk with gait training and balance exercises with minimal UE support; exercises able to be performed safely in water without the risk of fall compared to those same exercises performed on land;  viscosity of water needed for resistance for strengthening.  Current of water provides perturbations for challenging static & dynamic standing balance.  Movement of water needed to provide more visually challenging environment for visual-vestibular interactions.     PT Short Term Goals - 08/03/21 1022       PT SHORT TERM GOAL #1   Title Pt will initiate balance HEP and will trial walking with walking stick to be able to return to walking program    Baseline reports independence with current HEP and use of walking stick for walking program    Time 4    Period Weeks    Status Achieved    Target Date 08/07/21      PT SHORT TERM GOAL #2   Title Pt will increase # of repetitions on 30 second sit to stand to 14 to indicate increased functional LE strength    Baseline 11, 14 sit <> stands without UE support    Time 4    Period Weeks    Status Achieved    Target Date 08/07/21      PT SHORT TERM GOAL #3   Title Pt will increase FGA by 5 points to indicate decreased falls risk with dynamic gait.    Baseline 3/30; 12/30    Time 4    Period Weeks    Status Achieved    Target Date 08/07/21      PT SHORT TERM GOAL #4   Title Pt will demonstrate improved sensory integration by performing MCTSIB: EO conditions for 30 seconds with minimal sway and EC conditions x 15 seconds with close supervision    Baseline All Four situation 30 seconds with supervision    Time 4    Period Weeks    Status Achieved    Target Date  08/07/21      PT SHORT TERM GOAL #5   Title Pt will demonstrate ability to perform floor <> stand transfer with use of UE and min A x 2 reps    Baseline able to complete floor <> stand with UE on mat x 2 reps with CGA    Time 4    Period Weeks    Status Achieved    Target Date 08/07/21  PT Long Term Goals - 07/16/21 0645       PT LONG TERM GOAL #1   Title Pt will demonstrate independence with final HEP, will return to walking program 2-3x/week with LRAD, and will return to chair yoga at [facility]    Time 8    Period Weeks    Status New      PT LONG TERM GOAL #2   Title Pt will demonstrate ability to stand from floor with use of UE x 3 reps MOD I    Time 8    Period Weeks    Status New      PT LONG TERM GOAL #3   Title Pt will increase FGA score to >/= 19/30 to indicate decreased falls risk    Time 8    Period Weeks    Status New      PT LONG TERM GOAL #4   Title Pt will perform MCTSIB conditions with EC x 20 seconds with supervision to improve safety/balance with washing hair and ambulating in low light conditions    Time 8    Period Weeks    Status New      PT LONG TERM GOAL #5   Title Pt will ambulate x 1000 over variety of surfaces, up/down curb, and across parking lot with LRAD, MOD I and perform visual scanning for safety without evidence of imbalance    Time 8    Period Weeks    Status New                   Plan - 08/25/21 1357     Clinical Impression Statement Pt demonstrated progress with aquatic therapy today and was able to participate in ambulation across the width of the pool with noodle for UE support only.  Pt demonstrated only minimal imbalance when preforming static balance with eyes closed.  Continued to focus functional weight shifting and hip strategy training.  Pt unable to perform tandem gait in pool or perform seated exercises on noodle.  Will continue to address and progress towards LTG.    Personal Factors and  Comorbidities Comorbidity 2;Past/Current Experience;Social Background    Examination-Activity Limitations Bend;Locomotion Level;Stairs;Stand;Squat;Transfers    Examination-Participation Restrictions Cleaning;Community Activity;Yard Work    Conservation officer, historic buildings Evolving/Moderate complexity    Rehab Potential Good    PT Frequency 2x / week    PT Duration 8 weeks    PT Treatment/Interventions ADLs/Self Care Home Management;Aquatic Therapy;Canalith Repostioning;DME Instruction;Gait training;Stair training;Functional mobility training;Therapeutic activities;Therapeutic exercise;Balance training;Neuromuscular re-education;Patient/family education;Vestibular    PT Next Visit Plan Aquatic-walk with head turns, figure 8 gait in water, tandem gait, reaching out of BOS with noodle and feet togther, continue step ups and step downs.  LAND: up/down ramp without breaking stride.  Ramp with mat over it.  Continue balance training with EC. gait outdoors on unlevel surfaces, include inclines.    Consulted and Agree with Plan of Care Patient             Patient will benefit from skilled therapeutic intervention in order to improve the following deficits and impairments:  Abnormal gait, Decreased balance, Difficulty walking, Impaired perceived functional ability, Other (comment) (disequilibrium)  Visit Diagnosis: Unsteadiness on feet  Difficulty in walking, not elsewhere classified  Repeated falls     Problem List Patient Active Problem List   Diagnosis Date Noted   Atypical atrial flutter ([facility]) 11/13/2020   Persistent atrial fibrillation ([facility]) 06/19/2020   Atrial fibrillation with RVR ([facility]) 05/21/2020  Balance problems 05/21/2020   Hyperlipidemia 04/03/2018   GERD (gastroesophageal reflux disease) 03/31/2018    Rico Junker, PT, DPT 08/26/21    12:05 PM    [facility] [address] [place], [place],  [address] Phone: [phone]   Fax:  [phone]  Name: Nytia Evitts Rogowski MRN: [id number] Date of Birth: 09/09/1951

## 2021-08-27 ENCOUNTER — Ambulatory Visit: Payer: Medicare Other | Admitting: Physical Therapy

## 2021-08-27 ENCOUNTER — Other Ambulatory Visit: Payer: Self-pay

## 2021-08-27 DIAGNOSIS — R296 Repeated falls: Secondary | ICD-10-CM | POA: Diagnosis not present

## 2021-08-27 DIAGNOSIS — R262 Difficulty in walking, not elsewhere classified: Secondary | ICD-10-CM | POA: Diagnosis not present

## 2021-08-27 DIAGNOSIS — R2681 Unsteadiness on feet: Secondary | ICD-10-CM

## 2021-08-27 NOTE — Therapy (Signed)
[facility] [address] [place], [place], [address] Phone: [phone]   Fax:  [phone]  Physical Therapy Treatment  Patient Details  Name: Lisa Moore MRN: [id number] Date of Birth: 1951/05/20 Referring Provider (PT): Blenda Nicely, Henderson Cloud, MD   Encounter Date: 08/27/2021   PT End of Session - 08/27/21 1138     Visit Number 15    Number of Visits 17    Date for PT Re-Evaluation 09/06/21    Authorization Type Medicare    Progress Note Due on Visit 30    PT Start Time [phone]    PT Stop Time 0930    PT Time Calculation (min) 38 min    Activity Tolerance Patient tolerated treatment well    Behavior During Therapy [facility] for tasks assessed/performed             Past Medical History:  Diagnosis Date   Arthritis    Benign hematuria    GERD (gastroesophageal reflux disease)    Migraines    rare   Persistent atrial fibrillation ([place]) 05/21/2020   Typical atrial flutter ([place]) 11/13/2020    Past Surgical History:  Procedure Laterality Date   ABDOMINAL HYSTERECTOMY     APPENDECTOMY     ATRIAL FIBRILLATION ABLATION N/A 01/13/2021   Procedure: ATRIAL FIBRILLATION ABLATION;  Surgeon: Thompson Grayer, MD;  Location: [facility];  Service: Cardiovascular;  Laterality: N/A;   CARDIOVERSION N/A 07/07/2020   Procedure: CARDIOVERSION;  Surgeon: Josue Hector, MD;  Location: [facility] ENDOSCOPY;  Service: Cardiovascular;  Laterality: N/A;   CARDIOVERSION N/A 11/07/2020   Procedure: CARDIOVERSION;  Surgeon: Josue Hector, MD;  Location: [facility] ENDOSCOPY;  Service: Cardiovascular;  Laterality: N/A;    There were no vitals filed for this visit.   Subjective Assessment - 08/27/21 0853     Subjective Back and hip are about a 4/10 today.  Had a little fall yesterday; was bending down to pull a plant out of the ground and kept going forwards, was holding the shepherd's hook and that prevented her from falling all the way down.     Pertinent History OA, Migraines, a-fib with cardioversion - pt elected to stop Eliquis    Limitations Standing;Walking;House hold activities    Diagnostic tests MRI in 2021: negative brain MRI for age, no acute abnormality    Patient Stated Goals To remain independent, prevent falls, be able to garden, to be able to get off the floor independently.    Currently in Pain? Yes    Pain Score 4                [facility] Adult PT Treatment/Exercise - 08/27/21 0858       Ambulation/Gait   Ambulation/Gait Yes    Ambulation/Gait Assistance 5: Supervision    Ambulation/Gait Assistance Details around gym with focus on heel strike, increased stance time and stride length, focused also on more natural arm swing and then attempted to add in visual scanning.  3 laps    Ambulation Distance (Feet) 345 Feet    Assistive device None    Gait Pattern Step-through pattern;Decreased step length - right;Decreased step length - left    Ambulation Surface Level;Indoor    Gait Comments Still having difficulty with more open areas and light changes      Knee/Hip Exercises: Aerobic   Tread Mill slight incline 2%; 0.5 mph with UE support focusing on increasing step and stride length x 3 minutes.  Unable to increase  incline or practice downhill.  Required verbal cues to increase stance time and stride length bilaterally               Balance Exercises - 08/27/21 0924       Balance Exercises: Standing   Rockerboard Anterior/posterior;EO;10 reps;UE support    Rockerboard Limitations Feet staggered with R then L foot forwards performing diagonal weight shifting with anterior/posterior; also performed single leg on rockerboard performing weight shifting forwards and back x 5 reps and then adding contralateral LE step over to increase SLS and step length               PT Short Term Goals - 08/03/21 1022       PT SHORT TERM GOAL #1   Title Pt will initiate balance HEP and will trial walking with  walking stick to be able to return to walking program    Baseline reports independence with current HEP and use of walking stick for walking program    Time 4    Period Weeks    Status Achieved    Target Date 08/07/21      PT SHORT TERM GOAL #2   Title Pt will increase # of repetitions on 30 second sit to stand to 14 to indicate increased functional LE strength    Baseline 11, 14 sit <> stands without UE support    Time 4    Period Weeks    Status Achieved    Target Date 08/07/21      PT SHORT TERM GOAL #3   Title Pt will increase FGA by 5 points to indicate decreased falls risk with dynamic gait.    Baseline 3/30; 12/30    Time 4    Period Weeks    Status Achieved    Target Date 08/07/21      PT SHORT TERM GOAL #4   Title Pt will demonstrate improved sensory integration by performing MCTSIB: EO conditions for 30 seconds with minimal sway and EC conditions x 15 seconds with close supervision    Baseline All Four situation 30 seconds with supervision    Time 4    Period Weeks    Status Achieved    Target Date 08/07/21      PT SHORT TERM GOAL #5   Title Pt will demonstrate ability to perform floor <> stand transfer with use of UE and min A x 2 reps    Baseline able to complete floor <> stand with UE on mat x 2 reps with CGA    Time 4    Period Weeks    Status Achieved    Target Date 08/07/21               PT Long Term Goals - 07/16/21 0645       PT LONG TERM GOAL #1   Title Pt will demonstrate independence with final HEP, will return to walking program 2-3x/week with LRAD, and will return to chair yoga at [facility]    Time 8    Period Weeks    Status New      PT LONG TERM GOAL #2   Title Pt will demonstrate ability to stand from floor with use of UE x 3 reps MOD I    Time 8    Period Weeks    Status New      PT LONG TERM GOAL #3   Title Pt will increase FGA score to >/= 19/30 to indicate decreased falls risk  Time 8    Period Weeks    Status New      PT  LONG TERM GOAL #4   Title Pt will perform MCTSIB conditions with EC x 20 seconds with supervision to improve safety/balance with washing hair and ambulating in low light conditions    Time 8    Period Weeks    Status New      PT LONG TERM GOAL #5   Title Pt will ambulate x 1000 over variety of surfaces, up/down curb, and across parking lot with LRAD, MOD I and perform visual scanning for safety without evidence of imbalance    Time 8    Period Weeks    Status New                   Plan - 08/27/21 0929     Clinical Impression Statement Treatment session focused primarily on use of treadmill and rockerboard to facilitate increased weight shift during single limb stance to improve stance time and stride length during gait.  Patient able to demonstrate carryover of gait sequence when performing gait over level, indoor surfaces without UE support.    Personal Factors and Comorbidities Comorbidity 2;Past/Current Experience;Social Background    Examination-Activity Limitations Bend;Locomotion Level;Stairs;Stand;Squat;Transfers    Examination-Participation Restrictions Cleaning;Community Activity;Yard Work    Conservation officer, historic buildings Evolving/Moderate complexity    Rehab Potential Good    PT Frequency 2x / week    PT Duration 8 weeks    PT Treatment/Interventions ADLs/Self Care Home Management;Aquatic Therapy;Canalith Repostioning;DME Instruction;Gait training;Stair training;Functional mobility training;Therapeutic activities;Therapeutic exercise;Balance training;Neuromuscular re-education;Patient/family education;Vestibular    PT Next Visit Plan Aquatic-walk with head turns, figure 8 gait in water, tandem gait, reaching out of BOS with noodle and feet togther, continue step ups and step downs.  LAND: up/down ramp without breaking stride.  Ramp with mat over it.  Continue balance training with EC. Working in more open areas and changing between light and dark environments.     Consulted and Agree with Plan of Care Patient             Patient will benefit from skilled therapeutic intervention in order to improve the following deficits and impairments:  Abnormal gait, Decreased balance, Difficulty walking, Impaired perceived functional ability, Other (comment) (disequilibrium)  Visit Diagnosis: Unsteadiness on feet  Difficulty in walking, not elsewhere classified  Repeated falls     Problem List Patient Active Problem List   Diagnosis Date Noted   Atypical atrial flutter ([facility]) 11/13/2020   Persistent atrial fibrillation ([facility]) 06/19/2020   Atrial fibrillation with RVR ([facility]) 05/21/2020   Balance problems 05/21/2020   Hyperlipidemia 04/03/2018   GERD (gastroesophageal reflux disease) 03/31/2018    Dierdre Highman, PT, DPT 08/27/21    11:42 AM    [facility] [address] [place], [place], [address] Phone: [phone]   Fax:  [phone]  Name: Lisa Moore MRN: [id number] Date of Birth: 06/27/1951

## 2021-08-28 ENCOUNTER — Encounter: Payer: Medicare Other | Admitting: Physical Therapy

## 2021-09-01 ENCOUNTER — Ambulatory Visit: Payer: Medicare Other | Admitting: Physical Therapy

## 2021-09-01 ENCOUNTER — Other Ambulatory Visit: Payer: Self-pay

## 2021-09-01 DIAGNOSIS — R262 Difficulty in walking, not elsewhere classified: Secondary | ICD-10-CM

## 2021-09-01 DIAGNOSIS — R2681 Unsteadiness on feet: Secondary | ICD-10-CM

## 2021-09-01 DIAGNOSIS — R296 Repeated falls: Secondary | ICD-10-CM

## 2021-09-01 NOTE — Therapy (Signed)
[facility] [address] [place], [place], [address] Phone: [phone]   Fax:  [phone]  Physical Therapy Treatment  Patient Details  Name: Lisa Moore MRN: [id number] Date of Birth: September 06, 1951 Referring Provider (PT): Blenda Nicely, Henderson Cloud, MD   Encounter Date: 09/01/2021   PT End of Session - 09/01/21 1348     Visit Number 16    Number of Visits 17    Date for PT Re-Evaluation 09/06/21    Authorization Type Medicare    Progress Note Due on Visit 83    PT Start Time 1020    PT Stop Time 1100    PT Time Calculation (min) 40 min    Activity Tolerance Patient tolerated treatment well    Behavior During Therapy [facility] for tasks assessed/performed             Past Medical History:  Diagnosis Date   Arthritis    Benign hematuria    GERD (gastroesophageal reflux disease)    Migraines    rare   Persistent atrial fibrillation ([place]) 05/21/2020   Typical atrial flutter ([place]) 11/13/2020    Past Surgical History:  Procedure Laterality Date   ABDOMINAL HYSTERECTOMY     APPENDECTOMY     ATRIAL FIBRILLATION ABLATION N/A 01/13/2021   Procedure: ATRIAL FIBRILLATION ABLATION;  Surgeon: Thompson Grayer, MD;  Location: [facility];  Service: Cardiovascular;  Laterality: N/A;   CARDIOVERSION N/A 07/07/2020   Procedure: CARDIOVERSION;  Surgeon: Josue Hector, MD;  Location: [facility] ENDOSCOPY;  Service: Cardiovascular;  Laterality: N/A;   CARDIOVERSION N/A 11/07/2020   Procedure: CARDIOVERSION;  Surgeon: Josue Hector, MD;  Location: [facility] ENDOSCOPY;  Service: Cardiovascular;  Laterality: N/A;    There were no vitals filed for this visit.   Subjective Assessment - 09/01/21 1347     Subjective Back still hurting a little bit from doing housework.  No issues or falls to report.    Pertinent History OA, Migraines, a-fib with cardioversion - pt elected to stop Eliquis    Limitations Standing;Walking;House hold  activities    Diagnostic tests MRI in 2021: negative brain MRI for age, no acute abnormality    Patient Stated Goals To remain independent, prevent falls, be able to garden, to be able to get off the floor independently.    Currently in Pain? Yes             Patient seen for aquatic therapy today.  Treatment took place in water 3-4 feet deep depending upon activity.  Pt entered and exited the pool via stairs focusing on decreasing UE support on rails and performing alternating sequence.  Walking forwards across pool at 3.45ft depth x 4 laps with head turns to L and R x 2 laps then and nods up/down x 2 laps holding noodle close to body and PT providing min A for stabilization. Tandem gait forwards down and back from shallow > deep with one UE support on wall focusing on standing further away from wall for decreased UE support x 4 laps. With light bilat UE support on rails performed 10 reps alternating LE Step up with contralateral LE tap to next step with head turns towards tapping LE; Changed to lateral step ups x 10 to each side with one UE support and contralateral LE cross over tap to next step.  Performed to R and L with supervision.  Stood with staggered stance holding a noodle on each side for UE support; performed light bouncing  and then progressed to small jump switching feet placement R<>L forwards to increase LE stride length.  Carryover to ambulating forwards across pool with each hand holding noodle for UE support - performed reciprocal stepping with contralateral UE swing forwards with pause after stepping; performed x 2 laps.  Pt demonstrated greater difficulty with weight shift forwards over RLE to advance LLE and RUE.    Pt requires buoyancy of water for support for joint offloading for pain reduction in R hip, to reduce fall risk with gait training and balance exercises with minimal UE support; exercises able to be performed safely in water without the risk of fall compared to  those same exercises performed on land;  viscosity of water needed for resistance for strengthening.  Current of water provides perturbations for challenging static & dynamic standing balance.  Movement of water provided increased visual-vestibular challenge.   PT Education - 09/01/21 1348     Education Details practice reciprocal stepping and arm swing with trekking poles and focus on increased step and stride length    Person(s) Educated Patient    Methods Explanation    Comprehension Verbalized understanding              PT Short Term Goals - 08/03/21 1022       PT SHORT TERM GOAL #1   Title Pt will initiate balance HEP and will trial walking with walking stick to be able to return to walking program    Baseline reports independence with current HEP and use of walking stick for walking program    Time 4    Period Weeks    Status Achieved    Target Date 08/07/21      PT SHORT TERM GOAL #2   Title Pt will increase # of repetitions on 30 second sit to stand to 14 to indicate increased functional LE strength    Baseline 11, 14 sit <> stands without UE support    Time 4    Period Weeks    Status Achieved    Target Date 08/07/21      PT SHORT TERM GOAL #3   Title Pt will increase FGA by 5 points to indicate decreased falls risk with dynamic gait.    Baseline 3/30; 12/30    Time 4    Period Weeks    Status Achieved    Target Date 08/07/21      PT SHORT TERM GOAL #4   Title Pt will demonstrate improved sensory integration by performing MCTSIB: EO conditions for 30 seconds with minimal sway and EC conditions x 15 seconds with close supervision    Baseline All Four situation 30 seconds with supervision    Time 4    Period Weeks    Status Achieved    Target Date 08/07/21      PT SHORT TERM GOAL #5   Title Pt will demonstrate ability to perform floor <> stand transfer with use of UE and min A x 2 reps    Baseline able to complete floor <> stand with UE on mat x 2 reps with  CGA    Time 4    Period Weeks    Status Achieved    Target Date 08/07/21               PT Long Term Goals - 07/16/21 0645       PT LONG TERM GOAL #1   Title Pt will demonstrate independence with final HEP, will return to walking  program 2-3x/week with LRAD, and will return to chair yoga at [facility]    Time 8    Period Weeks    Status New      PT LONG TERM GOAL #2   Title Pt will demonstrate ability to stand from floor with use of UE x 3 reps MOD I    Time 8    Period Weeks    Status New      PT LONG TERM GOAL #3   Title Pt will increase FGA score to >/= 19/30 to indicate decreased falls risk    Time 8    Period Weeks    Status New      PT LONG TERM GOAL #4   Title Pt will perform MCTSIB conditions with EC x 20 seconds with supervision to improve safety/balance with washing hair and ambulating in low light conditions    Time 8    Period Weeks    Status New      PT LONG TERM GOAL #5   Title Pt will ambulate x 1000 over variety of surfaces, up/down curb, and across parking lot with LRAD, MOD I and perform visual scanning for safety without evidence of imbalance    Time 8    Period Weeks    Status New                   Plan - 09/01/21 1349     Clinical Impression Statement Pt continues to make progress in aquatic environment and was able to add head turns to ambulating across pool and when performing step ups.  Pt continues to rely less on UE during dynamic balance activities.  Utilized aquatic environment to focus on reciprocal arm swing and increased stride length; pt to continue to practice on land with trekking poles.  Will continue to address and progress towards LTG.    Personal Factors and Comorbidities Comorbidity 2;Past/Current Experience;Social Background    Examination-Activity Limitations Bend;Locomotion Level;Stairs;Stand;Squat;Transfers    Examination-Participation Restrictions Cleaning;Community Activity;Yard Work    Health visitor Evolving/Moderate complexity    Rehab Potential Good    PT Frequency 2x / week    PT Duration 8 weeks    PT Treatment/Interventions ADLs/Self Care Home Management;Aquatic Therapy;Canalith Repostioning;DME Instruction;Gait training;Stair training;Functional mobility training;Therapeutic activities;Therapeutic exercise;Balance training;Neuromuscular re-education;Patient/family education;Vestibular    PT Next Visit Plan Check goals and recert.  Aquatic-walk with head turns with arm swing, figure 8 gait in water, reaching out of BOS with noodle and feet togther, step downs.  LAND: SLS, increased stride length with trekking poles, up/down ramp without breaking stride.  Ramp with mat over it.  Continue balance training with EC. Working in more open areas and changing between light and dark environments.    Consulted and Agree with Plan of Care Patient             Patient will benefit from skilled therapeutic intervention in order to improve the following deficits and impairments:  Abnormal gait, Decreased balance, Difficulty walking, Impaired perceived functional ability, Other (comment) (disequilibrium)  Visit Diagnosis: Unsteadiness on feet  Difficulty in walking, not elsewhere classified  Repeated falls     Problem List Patient Active Problem List   Diagnosis Date Noted   Atypical atrial flutter ([facility]) 11/13/2020   Persistent atrial fibrillation ([facility]) 06/19/2020   Atrial fibrillation with RVR ([facility]) 05/21/2020   Balance problems 05/21/2020   Hyperlipidemia 04/03/2018   GERD (gastroesophageal reflux disease) 03/31/2018   Dierdre Highman, PT, DPT 09/01/21  3:27 PM   [facility] [address] [place], [place], [address] Phone: [phone]   Fax:  [phone]  Name: Derita Kalmbach Cisar MRN: [id number] Date of Birth: 05/30/1951

## 2021-09-03 ENCOUNTER — Ambulatory Visit: Payer: Medicare Other | Admitting: Physical Therapy

## 2021-09-03 ENCOUNTER — Other Ambulatory Visit: Payer: Self-pay

## 2021-09-03 DIAGNOSIS — R296 Repeated falls: Secondary | ICD-10-CM

## 2021-09-03 DIAGNOSIS — R262 Difficulty in walking, not elsewhere classified: Secondary | ICD-10-CM | POA: Diagnosis not present

## 2021-09-03 DIAGNOSIS — R2681 Unsteadiness on feet: Secondary | ICD-10-CM | POA: Diagnosis not present

## 2021-09-03 NOTE — Therapy (Signed)
[facility] [address] [place], [place], [address] Phone: [phone]   Fax:  [phone]  Physical Therapy Treatment  Patient Details  Name: Lisa Moore MRN: [id number] Date of Birth: [birth year] Referring Provider (PT): Blenda Nicely, Henderson Cloud, MD   Encounter Date: 09/03/2021   PT End of Session - 09/03/21 1140     Visit Number 17    Number of Visits 17    Date for PT Re-Evaluation 09/06/21    Authorization Type Medicare    Progress Note Due on Visit 34    PT Start Time 0850    PT Stop Time 0940    PT Time Calculation (min) 50 min    Activity Tolerance Patient tolerated treatment well    Behavior During Therapy [facility] for tasks assessed/performed             Past Medical History:  Diagnosis Date   Arthritis    Benign hematuria    GERD (gastroesophageal reflux disease)    Migraines    rare   Persistent atrial fibrillation ([place]) 05/21/2020   Typical atrial flutter ([place]) 11/13/2020    Past Surgical History:  Procedure Laterality Date   ABDOMINAL HYSTERECTOMY     APPENDECTOMY     ATRIAL FIBRILLATION ABLATION N/A 01/13/2021   Procedure: ATRIAL FIBRILLATION ABLATION;  Surgeon: Thompson Grayer, MD;  Location: [facility] CV LAB;  Service: Cardiovascular;  Laterality: N/A;   CARDIOVERSION N/A 07/07/2020   Procedure: CARDIOVERSION;  Surgeon: Josue Hector, MD;  Location: [facility] ENDOSCOPY;  Service: Cardiovascular;  Laterality: N/A;   CARDIOVERSION N/A 11/07/2020   Procedure: CARDIOVERSION;  Surgeon: Josue Hector, MD;  Location: [facility] ENDOSCOPY;  Service: Cardiovascular;  Laterality: N/A;    There were no vitals filed for this visit.   Subjective Assessment - 09/03/21 0849     Subjective Went to [employer] yesterday and the activity in the store caused her to feel a little off balance; had to use trekking pole in the store.  Back is hurting more today, 4/10 - kept her up last night.    Pertinent History OA,  Migraines, a-fib with cardioversion - pt elected to stop Eliquis    Limitations Standing;Walking;House hold activities    Diagnostic tests MRI in 2021: negative brain MRI for age, no acute abnormality    Patient Stated Goals To remain independent, prevent falls, be able to garden, to be able to get off the floor independently.    Currently in Pain? Yes    Pain Score 4     Pain Location Back                [facility] PT Assessment - 09/03/21 0857       Assessment   Medical Diagnosis Balance Disorder    Referring Provider (PT) Gavin Pound, MD    Onset Date/Surgical Date 07/03/21    Prior Therapy none      Precautions   Precautions Other (comment);Fall    Precaution Comments OA, Migraines, a-fib with cardioversion - pt elected to stop Eliquis      Balance Screen   Has the patient fallen in the past 6 months Yes      Prior Function   Level of Independence Independent      Observation/Other Assessments   Focus on Therapeutic Outcomes (FOTO)  Not assessed      Functional Gait  Assessment   Gait assessed  Yes    Gait Level Surface Walks 20 ft in less  than 5.5 sec, no assistive devices, good speed, no evidence for imbalance, normal gait pattern, deviates no more than 6 in outside of the 12 in walkway width.    Change in Gait Speed Able to change speed, demonstrates mild gait deviations, deviates 6-10 in outside of the 12 in walkway width, or no gait deviations, unable to achieve a major change in velocity, or uses a change in velocity, or uses an assistive device.    Gait with Horizontal Head Turns Performs head turns smoothly with slight change in gait velocity (eg, minor disruption to smooth gait path), deviates 6-10 in outside 12 in walkway width, or uses an assistive device.    Gait with Vertical Head Turns Performs task with slight change in gait velocity (eg, minor disruption to smooth gait path), deviates 6 - 10 in outside 12 in walkway width or uses assistive device     Gait and Pivot Turn Pivot turns safely in greater than 3 sec and stops with no loss of balance, or pivot turns safely within 3 sec and stops with mild imbalance, requires small steps to catch balance.    Step Over Obstacle Is able to step over one shoe box (4.5 in total height) without changing gait speed. No evidence of imbalance.    Gait with Narrow Base of Support Ambulates less than 4 steps heel to toe or cannot perform without assistance.    Gait with Eyes Closed Walks 20 ft, slow speed, abnormal gait pattern, evidence for imbalance, deviates 10-15 in outside 12 in walkway width. Requires more than 9 sec to ambulate 20 ft.    Ambulating Backwards Walks 20 ft, uses assistive device, slower speed, mild gait deviations, deviates 6-10 in outside 12 in walkway width.    Steps Alternating feet, must use rail.    Total Score 18    FGA comment: 18/30 - improved from 3/30                 Vestibular Assessment - 09/03/21 0911       Balancemaster   Balancemaster Comment M-CTSIB: 30 seconds for all 4 conditions without LOB or external assistance                [facility] Adult PT Treatment/Exercise - 09/03/21 0923       Transfers   Transfers Floor to Transfer    Floor to Transfer 6: Modified independent (Device/Increase time)    Floor to Transfer Details (indicate cue type and reason) x 2 from tall kneeling > half kneeling >stand with UE support on mat without assistance from PT            Updated patient's exercises as below.  Pt return demonstrated each safely.  Gaze Stabilization: Standing Feet Partial Heel-Toe    Feet in partial heel-toe position, right foot forwards, left foot back - keeping eyes fixed on target on busy background __3__ feet away, tilt head down 15-30 and move head side to side for ___60_ seconds. Repeat while moving head up and down for __60__ seconds.  Smaller head movement, but slightly faster. Switch feet to left foot forwards.  Repeat head  movements.   Walk forwards EYES CLOSED, Walk backwards EYES OPEN   Right hand on the counter - Walk forwards with eyes closed, slowly, keeping your balance.  At the end of the counter, open your eyes, walk backwards with right hand on the counter. Repeat 4 times.     PT Education - 09/03/21 1139  Education Details progress towards goals, areas to continue to address, updated HEP    Person(s) Educated Patient    Methods Explanation;Demonstration;Handout    Comprehension Verbalized understanding;Returned demonstration              PT Short Term Goals - 08/03/21 1022       PT SHORT TERM GOAL #1   Title Pt will initiate balance HEP and will trial walking with walking stick to be able to return to walking program    Baseline reports independence with current HEP and use of walking stick for walking program    Time 4    Period Weeks    Status Achieved    Target Date 08/07/21      PT SHORT TERM GOAL #2   Title Pt will increase # of repetitions on 30 second sit to stand to 14 to indicate increased functional LE strength    Baseline 11, 14 sit <> stands without UE support    Time 4    Period Weeks    Status Achieved    Target Date 08/07/21      PT SHORT TERM GOAL #3   Title Pt will increase FGA by 5 points to indicate decreased falls risk with dynamic gait.    Baseline 3/30; 12/30    Time 4    Period Weeks    Status Achieved    Target Date 08/07/21      PT SHORT TERM GOAL #4   Title Pt will demonstrate improved sensory integration by performing MCTSIB: EO conditions for 30 seconds with minimal sway and EC conditions x 15 seconds with close supervision    Baseline All Four situation 30 seconds with supervision    Time 4    Period Weeks    Status Achieved    Target Date 08/07/21      PT SHORT TERM GOAL #5   Title Pt will demonstrate ability to perform floor <> stand transfer with use of UE and min A x 2 reps    Baseline able to complete floor <> stand with UE on  mat x 2 reps with CGA    Time 4    Period Weeks    Status Achieved    Target Date 08/07/21               PT Long Term Goals - 09/03/21 4696       PT LONG TERM GOAL #1   Title Pt will demonstrate independence with final HEP, will return to walking program 2-3x/week with LRAD, and will return to chair yoga at [facility]    Time 8    Period Weeks    Status Partially Met      PT LONG TERM GOAL #2   Title Pt will demonstrate ability to stand from floor with use of UE x 3 reps MOD I    Time 8    Period Weeks    Status Achieved      PT LONG TERM GOAL #3   Title Pt will increase FGA score to >/= 19/30 to indicate decreased falls risk    Baseline 18/30    Time 8    Period Weeks    Status Partially Met      PT LONG TERM GOAL #4   Title Pt will perform MCTSIB conditions with EC x 20 seconds with supervision to improve safety/balance with washing hair and ambulating in low light conditions    Baseline met; all 4 conditions 30  seconds    Time 8    Period Weeks    Status Achieved      PT LONG TERM GOAL #5   Title Pt will ambulate x 1000 over variety of surfaces, up/down curb, and across parking lot with LRAD, MOD I and perform visual scanning for safety without evidence of imbalance    Time 8    Period Weeks    Status Unable to assess            New goals:   PT Short Term Goals - 09/03/21 1148       PT SHORT TERM GOAL #1   Title = LTG             PT Long Term Goals - 09/03/21 1148       PT LONG TERM GOAL #1   Title Pt will demonstrate independence with final HEP, will return to walking program 2-3x/week with LRAD, and will return to chair yoga at [facility]    Time 4    Period Weeks    Status On-going    Target Date 10/13/21      PT LONG TERM GOAL #2   Title Pt will demonstrate ability to stand from floor without the use of UE MOD I    Time 4    Period Weeks    Status Revised    Target Date 10/13/21      PT LONG TERM GOAL #3   Title Pt will increase FGA score  to >/= 22/30 to indicate decreased falls risk    Baseline 18/30    Time 4    Period Weeks    Status Revised    Target Date 10/13/21      PT LONG TERM GOAL #5   Title Pt will ambulate x 1000 over variety of surfaces, up/down curb, and across parking lot with LRAD, MOD I and perform visual scanning for safety without evidence of imbalance    Time 4    Period Weeks    Status Revised    Target Date 10/13/21                 Plan - 09/03/21 1141     Clinical Impression Statement Pt is making good progress and has met 2/5 LTG.  Pt demonstrated significant improvement in sensory organization as indicated by ability to maintain balance on all 4 conditions of M-CTSIB.  Pt also demonstrates ability to lower and rise from the floor with UE support independently without cues or assistance from therapist.  Pt has partially met FGA and HEP goals and demonstrates decreased falls risk but continues to require use of trekking pole when ambulating in the community and when in busier, more visually distracting environments.  Pt will benefit from continued skilled PT services to continue to address ongoing impairments to maximize functional mobility independence and continue to decrease falls risk.    Personal Factors and Comorbidities Comorbidity 2;Past/Current Experience;Social Background    Examination-Activity Limitations Bend;Locomotion Level;Stairs;Stand;Squat;Transfers    Examination-Participation Restrictions Cleaning;Community Activity;Yard Work    Rehab Potential Good    PT Frequency 2x / week    PT Duration 4 weeks    PT Treatment/Interventions ADLs/Self Care Home Management;Aquatic Therapy;Canalith Repostioning;DME Instruction;Gait training;Stair training;Functional mobility training;Therapeutic activities;Therapeutic exercise;Balance training;Neuromuscular re-education;Patient/family education;Vestibular    PT Next Visit Plan Aquatic-walk with head turns with arm swing, figure 8 gait in  water, reaching out of BOS with noodle and feet togther, step downs.  LAND: Check last goal -  ambulation outside; SLS on RLE, stepping over obstacles, increased stride length with trekking poles, up/down ramp without breaking stride.  Ramp with mat over it.  Continue balance training with EC. Working in more open areas and changing between light and dark environments.    Consulted and Agree with Plan of Care Patient             Patient will benefit from skilled therapeutic intervention in order to improve the following deficits and impairments:  Abnormal gait, Decreased balance, Difficulty walking, Impaired perceived functional ability, Other (comment) (disequilibrium)  Visit Diagnosis: Unsteadiness on feet  Difficulty in walking, not elsewhere classified  Repeated falls     Problem List Patient Active Problem List   Diagnosis Date Noted   Atypical atrial flutter ([place]) 11/13/2020   Persistent atrial fibrillation ([place]) 06/19/2020   Atrial fibrillation with RVR ([place]) 05/21/2020   Balance problems 05/21/2020   Hyperlipidemia 04/03/2018   GERD (gastroesophageal reflux disease) 03/31/2018   Rico Junker, PT, DPT 09/03/21    11:46 AM    [facility] [address] [place], [place], [address] Phone: [phone]   Fax:  [phone]  Name: Neve Branscomb Degroff MRN: [id number] Date of Birth: April 14, 1951

## 2021-09-03 NOTE — Patient Instructions (Addendum)
Gaze Stabilization: Standing Feet Partial Heel-Toe    Feet in partial heel-toe position, right foot forwards, left foot back - keeping eyes fixed on target on busy background __3__ feet away, tilt head down 15-30 and move head side to side for ___60_ seconds. Repeat while moving head up and down for __60__ seconds.  Smaller head movement, but slightly faster. Switch feet to left foot forwards.  Repeat head movements.  Feet Heel-Toe "Tandem"    Arms outstretched, walk a straight line bringing one foot directly in front of the other. Repeat for 4 laps per session. Do __2__ sessions per day.  Walk forwards EYES CLOSED, Walk backwards EYES OPEN   Right hand on the counter - Walk forwards with eyes closed, slowly, keeping your balance.  At the end of the counter, open your eyes, walk backwards with right hand on the counter. Repeat 4 times.   Step-Up: Forward <> Backward on Pillow    Standing in the corner, pillow in front of you and chair for support. High Step forward on to the pillow with your right foot, followed with left.  Step back and down off the pillow.  Repeat 5 times leading with right leg. Switch and lead with the left leg, 5 times.    Feet Together (Compliant Surface) Head Motion - Eyes Closed    Stand on compliant surface: pillow with feet CLOSE together, but not touching. Close eyes and move head slowly, side to side, 5 times. Rest Close eyes and move head slowly, up and down, 5 times Repeat 2 times per session. Do 2 sessions per day.  ____________________________________________________________________________________   Vivi Barrack, One Leg    Start with both feet on bed.  Squeeze your butt muscles, lift your hips. Keep the hips lifted while you lift one foot up.  Foot down, hips down. Repeat, lifting the other foot. Repeat __5__ times each leg.    Hip Abduction: Modified    Lying on right side, right knee and hip bent.  Top leg out  straight. Lift the top leg up towards the ceiling (doesn't have to be too high), keeping the pelvis rolled forwards but keep the leg from drifting forwards.   Repeat __10__ times per side.   Hip Abduction: Side-Stepping: Monster Steps (Eccentric)    Band around thighs.  Keeping the feet forwards, sit in a small squat and perform side steps against the resistance of the band to the right and then switch and step back to the left.  4 laps down and back, __2_ sets per day.

## 2021-09-15 ENCOUNTER — Ambulatory Visit: Payer: Medicare Other | Attending: Otolaryngology | Admitting: Physical Therapy

## 2021-09-15 ENCOUNTER — Other Ambulatory Visit: Payer: Self-pay

## 2021-09-15 DIAGNOSIS — R2681 Unsteadiness on feet: Secondary | ICD-10-CM | POA: Insufficient documentation

## 2021-09-15 DIAGNOSIS — R262 Difficulty in walking, not elsewhere classified: Secondary | ICD-10-CM | POA: Insufficient documentation

## 2021-09-15 DIAGNOSIS — R296 Repeated falls: Secondary | ICD-10-CM | POA: Diagnosis not present

## 2021-09-15 NOTE — Therapy (Signed)
[facility] [address] [place], [place], [address] Phone: [phone]   Fax:  [phone]  Physical Therapy Treatment  Patient Details  Name: Lisa Moore MRN: [id number] Date of Birth: [birth year] Referring Provider (PT): Blenda Nicely, Henderson Cloud, MD   Encounter Date: 09/15/2021   PT End of Session - 09/15/21 1219     Visit Number 18    Number of Visits 25    Date for PT Re-Evaluation 10/13/21    Authorization Type Medicare    Progress Note Due on Visit 64    PT Start Time 1017    PT Stop Time 1102    PT Time Calculation (min) 45 min    Equipment Utilized During Treatment Other (comment)   ankle flotation cuffs, noodle   Activity Tolerance Patient tolerated treatment well    Behavior During Therapy [facility] for tasks assessed/performed             Past Medical History:  Diagnosis Date   Arthritis    Benign hematuria    GERD (gastroesophageal reflux disease)    Migraines    rare   Persistent atrial fibrillation ([place]) 05/21/2020   Typical atrial flutter ([place]) 11/13/2020    Past Surgical History:  Procedure Laterality Date   ABDOMINAL HYSTERECTOMY     APPENDECTOMY     ATRIAL FIBRILLATION ABLATION N/A 01/13/2021   Procedure: ATRIAL FIBRILLATION ABLATION;  Surgeon: Thompson Grayer, MD;  Location: [facility] CV LAB;  Service: Cardiovascular;  Laterality: N/A;   CARDIOVERSION N/A 07/07/2020   Procedure: CARDIOVERSION;  Surgeon: Josue Hector, MD;  Location: [facility] ENDOSCOPY;  Service: Cardiovascular;  Laterality: N/A;   CARDIOVERSION N/A 11/07/2020   Procedure: CARDIOVERSION;  Surgeon: Josue Hector, MD;  Location: [facility] ENDOSCOPY;  Service: Cardiovascular;  Laterality: N/A;    There were no vitals filed for this visit.   Subjective Assessment - 09/15/21 1152     Subjective Had family in town for Christmas; experienced a little bit of dizziness but thinks it may have been due to fatigue.  Very mild hip and back pain  today.    Pertinent History OA, Migraines, a-fib with cardioversion - pt elected to stop Eliquis    Limitations Standing;Walking;House hold activities    Diagnostic tests MRI in 2021: negative brain MRI for age, no acute abnormality    Patient Stated Goals To remain independent, prevent falls, be able to garden, to be able to get off the floor independently.    Currently in Pain? Yes    Pain Score 2              Patient seen for aquatic therapy today.  Treatment took place in water 3-4 feet deep depending upon activity.  Pt entered and exited the pool via stairs with step over step technique with one UE support on railing.    Performed gait in 3 feet of water forwards x 4 laps across width of pool without UE support on noodle and therapist providing supervision focusing on increasing bilateral step length and stance time on RLE. Performed figure 8 gait in 108ft of water x 4 reps progressively increasing width of turn to move further away from wall/bench; pt performed without UE support and with close supervision-intermittent min A from PT for balance. Donned ankle flotation cuffs and performed high knee marching forwards without UE support x 4 laps down and back to facilitate increased SLS and time in stance phase; pt required min A from PT for stabilization  and to slow down/control anterior weight shift.  Returned to UE support on noodle while performing forwards walking x 4 laps with eyes closed with slight veering to L side; required min A for safety and balance.  Transitioned to stairs and performed single leg step ups with light UE support x 10 reps each LE with eyes closed for increased balance challenge and increased reliance on sensory information during weight shifting; required increased assistance from PT when leading with LLE due to increased LOB and use of UE to pull.  Transitioned to standing on bottom step and performing step downs forwards x 5 reps with RLE with UE support on noodle  in front of patient but holding rail to step backwards and up to bottom step, x5 reps holding rail when leading with LLE.  Attempted to perform step down leading with LLE with UE support on noodle only - pt required mod A from PT to perform due to significant anxiety when weight shifting forwards; able to perform 3 with PT controlling weight shift forwards and down.    Transitioned to standing with feet together, wall behind patient and one UE support on noodle.  Performed VOR cancellation with RUE x 10 reps L/R, x 10 reps up/down with close supervision as pt increased speed due to minor LOB.  Mild dizziness with side to side.    Pt requires buoyancy of water for support for joint offloading for pain reduction in hip and low back, to reduce fall risk with gait training and balance exercises with minimal UE support; exercises able to be performed safely in water without the risk of fall compared to those same exercises performed on land;  viscosity of water needed for resistance for strengthening.  Current of water provides perturbations for challenging static & dynamic standing balance and increasing visual flow during vestibular exercises.    PT Short Term Goals - 09/03/21 1148       PT SHORT TERM GOAL #1   Title = LTG               PT Long Term Goals - 09/03/21 1148       PT LONG TERM GOAL #1   Title Pt will demonstrate independence with final HEP, will return to walking program 2-3x/week with LRAD, and will return to chair yoga at [facility]    Time 4    Period Weeks    Status On-going    Target Date 10/13/21      PT LONG TERM GOAL #2   Title Pt will demonstrate ability to stand from floor without the use of UE MOD I    Time 4    Period Weeks    Status Revised    Target Date 10/13/21      PT LONG TERM GOAL #3   Title Pt will increase FGA score to >/= 22/30 to indicate decreased falls risk    Baseline 18/30    Time 4    Period Weeks    Status Revised    Target Date 10/13/21       PT LONG TERM GOAL #5   Title Pt will ambulate x 1000 over variety of surfaces, up/down curb, and across parking lot with LRAD, MOD I and perform visual scanning for safety without evidence of imbalance    Time 4    Period Weeks    Status Revised    Target Date 10/13/21              Plan -  09/15/21 1220     Clinical Impression Statement Pt demonstrated significant progress today and was able to ambulate within the aquatic environment 50% of the time without UE support while performing turns and while focusing on increased step length bilaterally.  Pt continues to have significant difficulty with SLS and weight shifting forwards when leading with LLE.  Will continue to address in order to progress towards LTG.    Personal Factors and Comorbidities Comorbidity 2;Past/Current Experience;Social Background    Examination-Activity Limitations Bend;Locomotion Level;Stairs;Stand;Squat;Transfers    Examination-Participation Restrictions Cleaning;Community Activity;Yard Work    Rehab Potential Good    PT Frequency 2x / week    PT Duration 4 weeks    PT Treatment/Interventions ADLs/Self Care Home Management;Aquatic Therapy;Canalith Repostioning;DME Instruction;Gait training;Stair training;Functional mobility training;Therapeutic activities;Therapeutic exercise;Balance training;Neuromuscular re-education;Patient/family education;Vestibular    PT Next Visit Plan Aquatic- SLS activities, curb/step downs, EC.  VOR cancellation/x2 viewing  LAND: Check last goal - ambulation outside; SLS on RLE, stepping over obstacles, increased stride length with trekking poles, up/down ramp without breaking stride.  Ramp with mat over it.  Continue balance training with EC. Working in more open areas and changing between light and dark environments.    Consulted and Agree with Plan of Care Patient             Patient will benefit from skilled therapeutic intervention in order to improve the following deficits and  impairments:  Abnormal gait, Decreased balance, Difficulty walking, Impaired perceived functional ability, Other (comment) (disequilibrium)  Visit Diagnosis: Unsteadiness on feet  Difficulty in walking, not elsewhere classified  Repeated falls     Problem List Patient Active Problem List   Diagnosis Date Noted   Atypical atrial flutter ([place]) 11/13/2020   Persistent atrial fibrillation ([place]) 06/19/2020   Atrial fibrillation with RVR ([place]) 05/21/2020   Balance problems 05/21/2020   Hyperlipidemia 04/03/2018   GERD (gastroesophageal reflux disease) 03/31/2018   Rico Junker, PT, DPT 09/15/21    12:26 PM  [facility] [address] [place], [place], [address] Phone: [phone]   Fax:  [phone]  Name: Lisa Moore MRN: [id number] Date of Birth: 02/08/1951

## 2021-09-18 ENCOUNTER — Ambulatory Visit: Payer: Medicare Other | Admitting: Physical Therapy

## 2021-09-18 ENCOUNTER — Other Ambulatory Visit: Payer: Self-pay

## 2021-09-18 DIAGNOSIS — R2681 Unsteadiness on feet: Secondary | ICD-10-CM

## 2021-09-18 DIAGNOSIS — R296 Repeated falls: Secondary | ICD-10-CM

## 2021-09-18 DIAGNOSIS — R262 Difficulty in walking, not elsewhere classified: Secondary | ICD-10-CM

## 2021-09-18 NOTE — Therapy (Signed)
[facility] [address] [place], [place], [address] Phone: [phone]   Fax:  [phone]  Physical Therapy Treatment  Patient Details  Name: Lisa Moore MRN: [id number] Date of Birth: 12/22/1950 Referring Provider (PT): Blenda Nicely, Henderson Cloud, MD   Encounter Date: 09/18/2021   PT End of Session - 09/18/21 0931     Visit Number 19    Number of Visits 25    Date for PT Re-Evaluation 10/13/21    Authorization Type Medicare    Progress Note Due on Visit 50    PT Start Time 0847    PT Stop Time 0931    PT Time Calculation (min) 44 min    Activity Tolerance Patient tolerated treatment well    Behavior During Therapy [facility] for tasks assessed/performed             Past Medical History:  Diagnosis Date   Arthritis    Benign hematuria    GERD (gastroesophageal reflux disease)    Migraines    rare   Persistent atrial fibrillation ([place]) 05/21/2020   Typical atrial flutter ([place]) 11/13/2020    Past Surgical History:  Procedure Laterality Date   ABDOMINAL HYSTERECTOMY     APPENDECTOMY     ATRIAL FIBRILLATION ABLATION N/A 01/13/2021   Procedure: ATRIAL FIBRILLATION ABLATION;  Surgeon: Thompson Grayer, MD;  Location: [place] CV LAB;  Service: Cardiovascular;  Laterality: N/A;   CARDIOVERSION N/A 07/07/2020   Procedure: CARDIOVERSION;  Surgeon: Josue Hector, MD;  Location: [facility] ENDOSCOPY;  Service: Cardiovascular;  Laterality: N/A;   CARDIOVERSION N/A 11/07/2020   Procedure: CARDIOVERSION;  Surgeon: Josue Hector, MD;  Location: [facility] ENDOSCOPY;  Service: Cardiovascular;  Laterality: N/A;    There were no vitals filed for this visit.   Subjective Assessment - 09/18/21 0857     Subjective No issues after the pool.  Agreeable to continue with therapy in February 1x/week; would like to continue aquatic.    Pertinent History OA, Migraines, a-fib with cardioversion - pt elected to stop Eliquis    Limitations  Standing;Walking;House hold activities    Diagnostic tests MRI in 2021: negative brain MRI for age, no acute abnormality    Patient Stated Goals To remain independent, prevent falls, be able to garden, to be able to get off the floor independently.    Currently in Pain? No/denies               [facility] Adult PT Treatment/Exercise - 09/18/21 0910       Ambulation/Gait   Ambulation/Gait Yes    Ambulation/Gait Assistance 5: Supervision    Ambulation/Gait Assistance Details outside over a variety of surfaces, pavement, grass, mulch, gravel holding trekking pole but not placing on the ground for support.  Also performed stepping over parking bumper, crossing cross walk quickly.  No dizziness.    Ambulation Distance (Feet) 1000 Feet    Assistive device Other (Comment)   trekking pole   Gait Pattern Step-through pattern;Decreased step length - right;Decreased step length - left    Ambulation Surface Outdoor;Paved;Gravel;Grass;Unlevel    Curb 5: Supervision;4: Min assist    Curb Details (indicate cue type and reason) multiple times leading with LLE to ascend and then changing between leading with L or RLE when descending with close supervision; one episode of mild LOB when stepping down, min A to recover.  No use of trekking pole      Knee/Hip Exercises: Standing   Step Down Right;Left;Step Height: 6";10 reps;Hand  Hold: 2;Hand Hold: 0;2 sets    Step Down Limitations EO without UE support, EC with and then without UE support with min A for safety               09/20/21 1625  PT Education  Education Details added more visits, 1x/week; plan for ongoing visits  Person(s) Educated Patient  Methods Explanation  Comprehension Verbalized understanding      PT Short Term Goals - 09/03/21 1148       PT SHORT TERM GOAL #1   Title = LTG               PT Long Term Goals - 09/20/21 1628       PT LONG TERM GOAL #1   Title Pt will demonstrate independence with final HEP, will  return to walking program 2-3x/week with LRAD, and will return to chair yoga at [facility]    Time 4    Period Weeks    Status On-going    Target Date 10/13/21      PT LONG TERM GOAL #2   Title Pt will demonstrate ability to stand from floor without the use of UE MOD I    Time 4    Period Weeks    Status Revised    Target Date 10/13/21      PT LONG TERM GOAL #3   Title Pt will increase FGA score to >/= 22/30 to indicate decreased falls risk    Baseline 18/30    Time 4    Period Weeks    Status Revised    Target Date 10/13/21      PT LONG TERM GOAL #5   Title Pt will ambulate x 1000 over variety of surfaces, up/down curb, and across parking lot with LRAD, MOD I and perform visual scanning for safety without evidence of imbalance    Baseline Met with trekking pole; will upgrade to independent without trekking pole    Time 4    Period Weeks    Status Achieved    Target Date 10/13/21            Updated LTG #5:  PT Long Term Goals - 09/20/21 1628       PT LONG TERM GOAL #1   Title Pt will demonstrate independence with final HEP, will return to walking program 2-3x/week with LRAD, and will return to chair yoga at [facility]    Time 4    Period Weeks    Status On-going    Target Date 10/13/21      PT LONG TERM GOAL #2   Title Pt will demonstrate ability to stand from floor without the use of UE MOD I    Time 4    Period Weeks    Status Revised    Target Date 10/13/21      PT LONG TERM GOAL #3   Title Pt will increase FGA score to >/= 22/30 to indicate decreased falls risk    Baseline 18/30    Time 4    Period Weeks    Status Revised    Target Date 10/13/21      PT LONG TERM GOAL #5   Title Pt will ambulate x 1000 over variety of surfaces, up/down curb, and across parking lot with no AD, independently and perform visual scanning for safety without evidence of imbalance    Baseline Met with trekking pole; will upgrade to independent without trekking pole    Time 4  Period  Weeks    Status Revised    Target Date 10/13/21              09/18/21 0931  Plan  Clinical Impression Statement Pt met final LTG and demonstrated ability to negotiate outside over variety of surfaces, holding but not using trekking pole, MOD I without symptoms of dizziness.  Continued to address dynamic balance during step downs focusing on increased weight shift anteriorly when leading with LLE.  Will continue to address on land and in aquatic environment.  Personal Factors and Comorbidities Comorbidity 2;Past/Current Experience;Social Background  Examination-Activity Limitations Bend;Locomotion Level;Stairs;Stand;Squat;Transfers  Examination-Participation Restrictions Cleaning;Community Activity;Yard Work  Pt will benefit from skilled therapeutic intervention in order to improve on the following deficits Abnormal gait;Decreased balance;Difficulty walking;Impaired perceived functional ability;Other (comment) (disequilibrium)  Rehab Potential Good  PT Frequency 2x / week  PT Duration 4 weeks  PT Treatment/Interventions ADLs/Self Care Home Management;Aquatic Therapy;Canalith Repostioning;DME Instruction;Gait training;Stair training;Functional mobility training;Therapeutic activities;Therapeutic exercise;Balance training;Neuromuscular re-education;Patient/family education;Vestibular  PT Next Visit Plan 10th visit PN; Aquatic- SLS activities, curb/step downs, EC.  VOR cancellation/x2 viewing  LAND: SLS on RLE, stepping over obstacles, increased stride length with trekking poles, up/down ramp without breaking stride.  Ramp with mat over it.  Continue balance training with EC. Working in more open areas and changing between light and dark environments.  Consulted and Agree with Plan of Care Patient    Patient will benefit from skilled therapeutic intervention in order to improve the following deficits and impairments:  Abnormal gait, Decreased balance, Difficulty walking, Impaired perceived  functional ability, Other (comment) (disequilibrium)  Visit Diagnosis: Unsteadiness on feet  Difficulty in walking, not elsewhere classified  Repeated falls     Problem List Patient Active Problem List   Diagnosis Date Noted   Atypical atrial flutter ([place]) 11/13/2020   Persistent atrial fibrillation ([place]) 06/19/2020   Atrial fibrillation with RVR ([place]) 05/21/2020   Balance problems 05/21/2020   Hyperlipidemia 04/03/2018   GERD (gastroesophageal reflux disease) 03/31/2018   Rico Junker, PT, DPT 09/20/21    4:34 PM    [facility] [address] [place], [place], [address] Phone: [phone]   Fax:  [phone]  Name: Lisa Moore MRN: [id number] Date of Birth: [birth year]

## 2021-09-22 ENCOUNTER — Ambulatory Visit: Payer: Medicare Other | Admitting: Physical Therapy

## 2021-09-22 ENCOUNTER — Other Ambulatory Visit: Payer: Self-pay

## 2021-09-22 DIAGNOSIS — R296 Repeated falls: Secondary | ICD-10-CM

## 2021-09-22 DIAGNOSIS — R262 Difficulty in walking, not elsewhere classified: Secondary | ICD-10-CM | POA: Diagnosis not present

## 2021-09-22 DIAGNOSIS — R2681 Unsteadiness on feet: Secondary | ICD-10-CM

## 2021-09-22 NOTE — Therapy (Signed)
[facility] [address] [place], [place], [address] Phone: [phone]   Fax:  [phone]  Physical Therapy Treatment and 10th Visit Progress Note  Patient Details  Name: Lisa Moore MRN: [id number] Date of Birth: 12/14/1950 Referring Provider (PT): Blenda Nicely Henderson Cloud, MD   Encounter Date: 09/22/2021  Progress Note Reporting Period 08/11/21 to 09/22/2021  See note below for Objective Data and Assessment of Progress/Goals.    PT End of Session - 09/22/21 1309     Visit Number 20    Number of Visits 25    Date for PT Re-Evaluation 10/13/21    Authorization Type Medicare    Progress Note Due on Visit 91    PT Start Time 1015    PT Stop Time 1100    PT Time Calculation (min) 45 min    Activity Tolerance Patient tolerated treatment well    Behavior During Therapy [facility] for tasks assessed/performed             Past Medical History:  Diagnosis Date   Arthritis    Benign hematuria    GERD (gastroesophageal reflux disease)    Migraines    rare   Persistent atrial fibrillation ([place]) 05/21/2020   Typical atrial flutter ([place]) 11/13/2020    Past Surgical History:  Procedure Laterality Date   ABDOMINAL HYSTERECTOMY     APPENDECTOMY     ATRIAL FIBRILLATION ABLATION N/A 01/13/2021   Procedure: ATRIAL FIBRILLATION ABLATION;  Surgeon: Thompson Grayer, MD;  Location: [place] [facility] LAB;  Service: Cardiovascular;  Laterality: N/A;   CARDIOVERSION N/A 07/07/2020   Procedure: CARDIOVERSION;  Surgeon: Josue Hector, MD;  Location: [facility] ENDOSCOPY;  Service: Cardiovascular;  Laterality: N/A;   CARDIOVERSION N/A 11/07/2020   Procedure: CARDIOVERSION;  Surgeon: Josue Hector, MD;  Location: [facility] ENDOSCOPY;  Service: Cardiovascular;  Laterality: N/A;    There were no vitals filed for this visit.   Subjective Assessment - 09/22/21 1102     Subjective 3/10 low back pain.  Only dizziness she is experiencing is when  standing on compliant surface, EC, head turns.  Has been practicing stepping down with LLE.    Pertinent History OA, Migraines, a-fib with cardioversion - pt elected to stop Eliquis    Limitations Standing;Walking;House hold activities    Diagnostic tests MRI in 2021: negative brain MRI for age, no acute abnormality    Patient Stated Goals To remain independent, prevent falls, be able to garden, to be able to get off the floor independently.    Currently in Pain? Yes    Pain Score 3              Patient seen for aquatic therapy today.  Treatment took place in water 3-4 feet deep depending upon activity.  Pt entered and exited the pool via stairs, alternating sequence, with one UE support on rail.  Walking length of pool from shallow <> deeper water forwards x 3 laps back and forth without UE support on noodle; added in head turns and head nods on last lap with close supervision/min guard.  Changed to retro walking shallow <> deep water forwards x 3 laps back and forth without UE support; added in head turns and head nods on last lap with close supervision.  Pt demonstrated increased step length bilaterally and increased stance time on LLE.  Changed to high knee walking forwards without UE support x 3 laps shallow <> deep and intermittent min A with pt demonstrating improved  SLS stability and weight shifting forwards.  Performed walking forwards shallow <> deep with EC with one UE support x 3 laps down and back still with veering to L but increased step length.    Performed single leg step ups and back down retro x 3 reps each LE and then progressed to stepping up and then down forwards off other side of open step alternating between leading with RLE to ascend and descend and then LLE to ascend and descend x 5 reps with intermittent UE support on wall or therapist.  Improved ability to maintain forward weight shift when descending.  Performed visual, head, trunk rotations to L and R x 10 reps with  feet apart and then feet together holding noodle out in front of patient for stability; no dizziness.  Changed to staggered stance [place] Accepting anterior<>posterior weight shifting with UE horizontal ABD <> ADD x 10 reps with each foot forwards.  Pt requires buoyancy of water for support for joint offloading for pain reduction in hip and low back, to reduce fall risk with gait training and balance exercises with minimal UE support; exercises able to be performed safely in water without the risk of fall compared to those same exercises performed on land;  viscosity of water needed for resistance for strengthening.  Current of water provides perturbations for challenging static & dynamic standing balance and challenging visual-vestibular system.     PT Short Term Goals - 09/03/21 1148       PT SHORT TERM GOAL #1   Title = LTG               PT Long Term Goals - 09/20/21 1628       PT LONG TERM GOAL #1   Title Pt will demonstrate independence with final HEP, will return to walking program 2-3x/week with LRAD, and will return [facility]    Time 4    Period Weeks    Status On-going    Target Date 10/13/21      PT LONG TERM GOAL #2   Title Pt will demonstrate ability to stand from floor without the use of UE MOD I    Time 4    Period Weeks    Status Revised    Target Date 10/13/21      PT LONG TERM GOAL #3   Title Pt will increase FGA score to >/= 22/30 to indicate decreased falls risk    Baseline 18/30    Time 4    Period Weeks    Status Revised    Target Date 10/13/21      PT LONG TERM GOAL #5   Title Pt will ambulate x 1000 over variety of surfaces, up/down curb, and across parking lot with no AD, independently and perform visual scanning for safety without evidence of imbalance    Baseline Met with trekking pole; will upgrade to independent without trekking pole    Time 4    Period Weeks    Status Revised    Target Date 10/13/21              Plan -  09/22/21 1312     Clinical Impression Statement Pt demonstrated significant progress in aquatic environment today and demonstrated ability to ambulate into open water (more visual flow) without UE support and perform more dynamic gait challenges without significant dizziness or LOB.    Personal Factors and Comorbidities Comorbidity 2;Past/Current Experience;Social Background    Examination-Activity Limitations Bend;Locomotion  Level;Stairs;Stand;Squat;Transfers    Examination-Participation Restrictions Cleaning;Community Activity;Yard Work    Rehab Potential Good    PT Frequency 2x / week    PT Duration 4 weeks    PT Treatment/Interventions ADLs/Self Care Home Management;Aquatic Therapy;Canalith Repostioning;DME Instruction;Gait training;Stair training;Functional mobility training;Therapeutic activities;Therapeutic exercise;Balance training;Neuromuscular re-education;Patient/family education;Vestibular    PT Next Visit Plan 10th visit PN; Aquatic- SLS activities, curb/step downs, EC.  VOR cancellation/x2 viewing  LAND: SLS on RLE, stepping over obstacles, increased stride length with trekking poles, up/down ramp without breaking stride.  Ramp with mat over it.  Continue balance training with EC. Working in more open areas and changing between light and dark environments.    Consulted and Agree with Plan of Care Patient             Patient will benefit from skilled therapeutic intervention in order to improve the following deficits and impairments:  Abnormal gait, Decreased balance, Difficulty walking, Impaired perceived functional ability, Other (comment) (disequilibrium)  Visit Diagnosis: Unsteadiness on feet  Difficulty in walking, not elsewhere classified  Repeated falls     Problem List Patient Active Problem List   Diagnosis Date Noted   Atypical atrial flutter ([place]) 11/13/2020   Persistent atrial fibrillation ([place]) 06/19/2020   Atrial fibrillation with RVR ([place]) 05/21/2020    Balance problems 05/21/2020   Hyperlipidemia 04/03/2018   GERD (gastroesophageal reflux disease) 03/31/2018    Rico Junker, PT, DPT 09/22/21    5:03 PM   [facility] [address] [place], [place], [address] Phone: [phone]   Fax:  [phone]  Name: Alayzha An Flaim MRN: [id number] Date of Birth: [birth year]

## 2021-09-24 ENCOUNTER — Ambulatory Visit: Payer: Medicare Other | Admitting: Physical Therapy

## 2021-09-24 ENCOUNTER — Other Ambulatory Visit: Payer: Self-pay

## 2021-09-24 DIAGNOSIS — R2681 Unsteadiness on feet: Secondary | ICD-10-CM

## 2021-09-24 DIAGNOSIS — R296 Repeated falls: Secondary | ICD-10-CM

## 2021-09-24 DIAGNOSIS — R262 Difficulty in walking, not elsewhere classified: Secondary | ICD-10-CM

## 2021-09-24 NOTE — Therapy (Signed)
[facility] [address] [place], [place], [address] Phone: [phone]   Fax:  [phone]  Physical Therapy Treatment  Patient Details  Name: Lisa Moore MRN: [id number] Date of Birth: 1951-05-06 Referring Provider (PT): Blenda Nicely, Henderson Cloud, MD   Encounter Date: 09/24/2021   PT End of Session - 09/24/21 1146     Visit Number 21    Number of Visits 25    Date for PT Re-Evaluation 10/13/21    Authorization Type Medicare    Progress Note Due on Visit 48    PT Start Time 0846    PT Stop Time 0931    PT Time Calculation (min) 45 min    Activity Tolerance Patient tolerated treatment well    Behavior During Therapy [facility] for tasks assessed/performed             Past Medical History:  Diagnosis Date   Arthritis    Benign hematuria    GERD (gastroesophageal reflux disease)    Migraines    rare   Persistent atrial fibrillation ([place]) 05/21/2020   Typical atrial flutter ([place]) 11/13/2020    Past Surgical History:  Procedure Laterality Date   ABDOMINAL HYSTERECTOMY     APPENDECTOMY     ATRIAL FIBRILLATION ABLATION N/A 01/13/2021   Procedure: ATRIAL FIBRILLATION ABLATION;  Surgeon: Thompson Grayer, MD;  Location: [place] CV LAB;  Service: Cardiovascular;  Laterality: N/A;   CARDIOVERSION N/A 07/07/2020   Procedure: CARDIOVERSION;  Surgeon: Josue Hector, MD;  Location: [facility] ENDOSCOPY;  Service: Cardiovascular;  Laterality: N/A;   CARDIOVERSION N/A 11/07/2020   Procedure: CARDIOVERSION;  Surgeon: Josue Hector, MD;  Location: [facility] ENDOSCOPY;  Service: Cardiovascular;  Laterality: N/A;    There were no vitals filed for this visit.   Subjective Assessment - 09/24/21 0848     Subjective 1/10 low back pain today!  Notices when she is in one position for a long period of time her back hurts more.    Pertinent History OA, Migraines, a-fib with cardioversion - pt elected to stop Eliquis    Limitations  Standing;Walking;House hold activities    Diagnostic tests MRI in 2021: negative brain MRI for age, no acute abnormality    Patient Stated Goals To remain independent, prevent falls, be able to garden, to be able to get off the floor independently.    Currently in Pain? Yes    Pain Score 1               [facility] Adult PT Treatment/Exercise - 09/24/21 0909       Ambulation/Gait   Ambulation/Gait Yes    Ambulation/Gait Assistance 5: Supervision;4: Min guard    Ambulation/Gait Assistance Details gait over indoor surfaces with bilat trekking poles to facilitate increased trunk rotation, lateral weight shift, increased stance on LLE and increased step length.  Began slowly holding each step, and then hand over hand assistance to facilitate sequence, then attempted to perform continuous stepping with alternating sequence with multiple starts and stops to reset sequence.  Pt demonstrated increased step length and stance time on LLE.    Assistive device Other (Comment)   bilat trekking poles   Gait Pattern Step-through pattern;Decreased arm swing - right;Decreased arm swing - left    Ambulation Surface Level;Indoor    Pre-Gait Activities with bilat trekking poles performed alternating stepping over obstacle first with 4 point gait pattern and then with two forwards <> retro.      High Level Balance  High Level Balance Activities Side stepping;Weight-shifting turns;Negotiating over obstacles    High Level Balance Comments 4 square stepping over obstacles standing on LLE stepping RLE forwards<> middle, diagonal <> middle, R<>middle x 10 reps.  Also performed forward, diagonal and L with LLE.  Pt demonstrated greater difficulty with standing on RLE and stepping diagonally with LLE due to impaired ER and ABD closed chain in RLE.  Stood at counter and practiced isolated R hip closed chain hip ABD and ER rotating body and knee to L but pt reported pain in R low back due to increased use of trunk extension  and rotation.                PT Short Term Goals - 09/03/21 1148       PT SHORT TERM GOAL #1   Title = LTG               PT Long Term Goals - 09/20/21 1628       PT LONG TERM GOAL #1   Title Pt will demonstrate independence with final HEP, will return to walking program 2-3x/week with LRAD, and will return to chair yoga at [facility]    Time 4    Period Weeks    Status On-going    Target Date 10/13/21      PT LONG TERM GOAL #2   Title Pt will demonstrate ability to stand from floor without the use of UE MOD I    Time 4    Period Weeks    Status Revised    Target Date 10/13/21      PT LONG TERM GOAL #3   Title Pt will increase FGA score to >/= 22/30 to indicate decreased falls risk    Baseline 18/30    Time 4    Period Weeks    Status Revised    Target Date 10/13/21      PT LONG TERM GOAL #5   Title Pt will ambulate x 1000 over variety of surfaces, up/down curb, and across parking lot with no AD, independently and perform visual scanning for safety without evidence of imbalance    Baseline Met with trekking pole; will upgrade to independent without trekking pole    Time 4    Period Weeks    Status Revised    Target Date 10/13/21                   Plan - 09/24/21 1147     Clinical Impression Statement Pt reporting significant decrease in low back pain today.  Utilized bilat trekking poles and stepping today to continue to facilitate increased SLS stability during stance phase and weight shifting, increased step length, and increased glute med activation.  Pt demonstrates significant weakness in R glute med and increased compensation with R lumbar muscles.    Personal Factors and Comorbidities Comorbidity 2;Past/Current Experience;Social Background    Examination-Activity Limitations Bend;Locomotion Level;Stairs;Stand;Squat;Transfers    Examination-Participation Restrictions Cleaning;Community Activity;Yard Work    Rehab Potential Good    PT Frequency  2x / week    PT Duration 4 weeks    PT Treatment/Interventions ADLs/Self Care Home Management;Aquatic Therapy;Canalith Repostioning;DME Instruction;Gait training;Stair training;Functional mobility training;Therapeutic activities;Therapeutic exercise;Balance training;Neuromuscular re-education;Patient/family education;Vestibular    PT Next Visit Plan Aquatic- SLS activities, curb/step downs, EC.  VOR cancellation/x2 viewing  LAND: SLS on RLE glute med and rotation in closed chain, stepping over obstacles, increased stride length with trekking poles, up/down ramp without breaking stride.  Ramp  with mat over it.  Continue balance training with EC. Working in more open areas and changing between light and dark environments.    Consulted and Agree with Plan of Care Patient             Patient will benefit from skilled therapeutic intervention in order to improve the following deficits and impairments:  Abnormal gait, Decreased balance, Difficulty walking, Impaired perceived functional ability, Other (comment) (disequilibrium)  Visit Diagnosis: Unsteadiness on feet  Difficulty in walking, not elsewhere classified  Repeated falls     Problem List Patient Active Problem List   Diagnosis Date Noted   Atypical atrial flutter ([place]) 11/13/2020   Persistent atrial fibrillation ([place]) 06/19/2020   Atrial fibrillation with RVR ([place]) 05/21/2020   Balance problems 05/21/2020   Hyperlipidemia 04/03/2018   GERD (gastroesophageal reflux disease) 03/31/2018    Rico Junker, PT, DPT 09/24/21    11:52 AM    [facility] [address] [place], [place], [address] Phone: [phone]   Fax:  [phone]  Name: Lisa Moore MRN: [id number] Date of Birth: 30-Aug-1951

## 2021-09-25 ENCOUNTER — Ambulatory Visit (INDEPENDENT_AMBULATORY_CARE_PROVIDER_SITE_OTHER): Payer: Medicare Other | Admitting: Nurse Practitioner

## 2021-09-25 ENCOUNTER — Encounter (HOSPITAL_BASED_OUTPATIENT_CLINIC_OR_DEPARTMENT_OTHER): Payer: Self-pay | Admitting: Nurse Practitioner

## 2021-09-25 VITALS — BP 110/74 | HR 74 | Ht 65.0 in | Wt 195.0 lb

## 2021-09-25 DIAGNOSIS — Z8679 Personal history of other diseases of the circulatory system: Secondary | ICD-10-CM

## 2021-09-25 DIAGNOSIS — Z13220 Encounter for screening for lipoid disorders: Secondary | ICD-10-CM | POA: Diagnosis not present

## 2021-09-25 DIAGNOSIS — Z1329 Encounter for screening for other suspected endocrine disorder: Secondary | ICD-10-CM

## 2021-09-25 DIAGNOSIS — Z13228 Encounter for screening for other metabolic disorders: Secondary | ICD-10-CM

## 2021-09-25 DIAGNOSIS — Z1321 Encounter for screening for nutritional disorder: Secondary | ICD-10-CM | POA: Diagnosis not present

## 2021-09-25 DIAGNOSIS — Z23 Encounter for immunization: Secondary | ICD-10-CM

## 2021-09-25 DIAGNOSIS — I519 Heart disease, unspecified: Secondary | ICD-10-CM | POA: Insufficient documentation

## 2021-09-25 DIAGNOSIS — Z780 Asymptomatic menopausal state: Secondary | ICD-10-CM

## 2021-09-25 DIAGNOSIS — Z130 Encounter for screening for diseases of the blood and blood-forming organs and certain disorders involving the immune mechanism: Secondary | ICD-10-CM | POA: Diagnosis not present

## 2021-09-25 DIAGNOSIS — Z1231 Encounter for screening mammogram for malignant neoplasm of breast: Secondary | ICD-10-CM

## 2021-09-25 DIAGNOSIS — Z13820 Encounter for screening for osteoporosis: Secondary | ICD-10-CM

## 2021-09-25 DIAGNOSIS — R2689 Other abnormalities of gait and mobility: Secondary | ICD-10-CM

## 2021-09-25 MED ORDER — ZOSTER VAC RECOMB ADJUVANTED 50 MCG/0.5ML IM SUSR: 0.5000 mL | Freq: Once | INTRAMUSCULAR | 1 refills | Status: AC

## 2021-09-25 MED ORDER — TETANUS-DIPHTH-ACELL PERTUSSIS 5-2.5-18.5 LF-MCG/0.5 IM SUSP: 0.5000 mL | Freq: Once | INTRAMUSCULAR | 0 refills | Status: AC

## 2021-09-25 NOTE — Patient Instructions (Signed)
Thank you for choosing [facility] for your Primary Care needs. I am excited for the opportunity to partner with you to meet your health care goals. It was a pleasure meeting you today!  Recommendations from today's visit: We will plan to see you in a year for your annual wellness visit.  I will be in touch with you about your lab work Someone will call you to schedule the bone density scan and mammogram.   Information on diet, exercise, and health maintenance recommendations are listed below. This is information to help you be sure you are on track for optimal health and monitoring.   Please look over this and let us know if you have any questions or if you have completed any of the health maintenance outside of [place] so that we can be sure your records are up to date.  ___________________________________________________________ About Me: I am an Adult-Geriatric Nurse Practitioner with a background in caring for patients for more than 20 years with a strong intensive care background. I provide primary care and sports medicine services to patients age 69 and older within this office. My education had a strong focus on caring for the older adult population, which I am passionate about. I am also the director of the APP Fellowship with [facility].   My desire is to provide you with the best service through preventive medicine and supportive care. I consider you a part of the medical team and value your input. I work diligently to ensure that you are heard and your needs are met in a safe and effective manner. I want you to feel comfortable with me as your provider and want you to know that your health concerns are important to me.  For your information, our office hours are: Monday, Tuesday, and Thursday 8:00 AM - 5:00 PM Wednesday and Friday 8:00 AM - 12:00 PM.   In my time away from the office I am teaching new APP's within the system and am unavailable,  but my partner, Dr. Burnard Bunting is in the office for emergent needs.   If you have questions or concerns, please call our office at [phone] or send [place] a MyChart message and we will respond as quickly as possible.  ____________________________________________________________ MyChart:  For all urgent or time sensitive needs we ask that you please call the office to avoid delays. Our number is [phone]. MyChart is not constantly monitored and due to the large volume of messages a day, replies may take up to 72 business hours.  MyChart Policy: MyChart allows for you to see your visit notes, after visit summary, provider recommendations, lab and tests results, make an appointment, request refills, and contact your provider or the office for non-urgent questions or concerns. Providers are seeing patients during normal business hours and do not have built in time to review MyChart messages.  We ask that you allow a minimum of 3 business days for responses to [employer]. For this reason, please do not send urgent requests through [place]. Please call the office at [phone]. New and ongoing conditions may require a visit. We have virtual and in person visit available for your convenience.  Complex MyChart concerns may require a visit. Your provider may request you schedule a virtual or in person visit to ensure we are providing the best care possible. MyChart messages sent after 11:00 AM on Friday will not be received by the provider until Monday morning.    Lab and  Test Results: You will receive your lab and test results on MyChart as soon as they are completed and results have been sent by the lab or testing facility. Due to this service, you will receive your results BEFORE your provider.  I review lab and tests results each morning prior to seeing patients. Some results require collaboration with other providers to ensure you are receiving the most appropriate care. For this reason, we  ask that you please allow a minimum of 3-5 business days from the time the ALL results have been received for your provider to receive and review lab and test results and contact you about these.  Most lab and test result comments from the provider will be sent through [place]. Your provider may recommend changes to the plan of care, follow-up visits, repeat testing, ask questions, or request an office visit to discuss these results. You may reply directly to this message or call the office at [phone] to provide information for the provider or set up an appointment. In some instances, you will be called with test results and recommendations. Please let us know if this is preferred and we will make note of this in your chart to provide this for you.    If you have not heard a response to your lab or test results in 5 business days from all results returning to [place], please call the office to let us know. We ask that you please avoid calling prior to this time unless there is an emergent concern. Due to high call volumes, this can delay the resulting process.  After Hours: For all non-emergency after hours needs, please call the office at [phone] and select the option to reach the on-call provider service. On-call services are shared between multiple [place] offices and therefore it will not be possible to speak directly with your provider. On-call providers may provide medical advice and recommendations, but are unable to provide refills for maintenance medications.  For all emergency or urgent medical needs after normal business hours, we recommend that you seek care at the closest Urgent Care or Emergency Department to ensure appropriate treatment in a timely manner.  [facility] [place] has a 24 hour emergency room located on the ground floor for your convenience.   Urgent Concerns During the Business Day Providers are seeing patients from 8AM to [place] with a busy schedule  and are most often not able to respond to non-urgent calls until the end of the day or the next business day. If you should have URGENT concerns during the day, please call and speak to the nurse or schedule a same day appointment so that we can address your concern without delay.   Thank you, again, for choosing me as your health care partner. I appreciate your trust and look forward to learning more about you.   Worthy Keeler, DNP, AGNP-c ___________________________________________________________  Health Maintenance Recommendations Screening Testing Mammogram Every 1 -2 years based on history and risk factors Starting at age 20 Pap Smear Ages 21-39 every 3 years Ages 25-65 every 5 years with HPV testing More frequent testing may be required based on results and history Colon Cancer Screening Every 1-10 years based on test performed, risk factors, and history Starting at age 40 Bone Density Screening Every 2-10 years based on history Starting at age 62 for women Recommendations for men differ based on medication usage, history, and risk factors AAA Screening One time ultrasound Men 81-38 years old who have every smoked  Lung Cancer Screening Low Dose Lung CT every 12 months Age 26-80 years with a 30 pack-year smoking history who still smoke or who have quit within the last 15 years  Screening Labs Routine  Labs: Complete Blood Count (CBC), Complete Metabolic Panel (CMP), Cholesterol (Lipid Panel) Every 6-12 months based on history and medications May be recommended more frequently based on current conditions or previous results Hemoglobin A1c Lab Every 3-12 months based on history and previous results Starting at age 85 or earlier with diagnosis of diabetes, high cholesterol, BMI >26, and/or risk factors Frequent monitoring for patients with diabetes to ensure blood sugar control Thyroid Panel (TSH w/ T3 & T4) Every 6 months based on history, symptoms, and risk factors May  be repeated more often if on medication HIV One time testing for all patients 38 and older May be repeated more frequently for patients with increased risk factors or exposure Hepatitis C One time testing for all patients 8 and older May be repeated more frequently for patients with increased risk factors or exposure Gonorrhea, Chlamydia Every 12 months for all sexually active persons 13-24 years Additional monitoring may be recommended for those who are considered high risk or who have symptoms PSA Men 57-69 years old with risk factors Additional screening may be recommended from age 67-69 based on risk factors, symptoms, and history  Vaccine Recommendations Tetanus Booster All adults every 10 years Flu Vaccine All patients 6 months and older every year COVID Vaccine All patients 12 years and older Initial dosing with booster May recommend additional booster based on age and health history HPV Vaccine 2 doses all patients age 21-26 Dosing may be considered for patients over 26 Shingles Vaccine (Shingrix) 2 doses all adults 77 years and older Pneumonia (Pneumovax 23) All adults 68 years and older May recommend earlier dosing based on health history Pneumonia (Prevnar 5) All adults 29 years and older Dosed 1 year after Pneumovax 23  Additional Screening, Testing, and Vaccinations may be recommended on an individualized basis based on family history, health history, risk factors, and/or exposure.  __________________________________________________________  Diet Recommendations for All Patients  I recommend that all patients maintain a diet low in saturated fats, carbohydrates, and cholesterol. While this can be challenging at first, it is not impossible and small changes can make big differences.  Things to try: Decreasing the amount of soda, sweet tea, and/or juice to one or less per day and replace with water While water is always the first choice, if you do not like water  you may consider adding a water additive without sugar to improve the taste other sugar free drinks Replace potatoes with a brightly colored vegetable at dinner Use healthy oils, such as canola oil or olive oil, instead of butter or hard margarine Limit your bread intake to two pieces or less a day Replace regular pasta with low carb pasta options Bake, broil, or grill foods instead of frying Monitor portion sizes  Eat smaller, more frequent meals throughout the day instead of large meals  An important thing to remember is, if you love foods that are not great for your health, you don't have to give them up completely. Instead, allow these foods to be a reward when you have done well. Allowing yourself to still have special treats every once in a while is a nice way to tell yourself thank you for working hard to keep yourself healthy.   Also remember that every day is a new day. If you  have a bad day and "fall off the wagon", you can still climb right back up and keep moving along on your journey!  We have resources available to help you!  Some websites that may be helpful include: www.http://carter.biz/  Www.VeryWellFit.com _____________________________________________________________  Activity Recommendations for All Patients  I recommend that all adults get at least 20 minutes of moderate physical activity that elevates your heart rate at least 5 days out of the week.  Some examples include: Walking or jogging at a pace that allows you to carry on a conversation Cycling (stationary bike or outdoors) Water aerobics Yoga Weight lifting Dancing If physical limitations prevent you from putting stress on your joints, exercise in a pool or seated in a chair are excellent options.  Do determine your MAXIMUM heart rate for activity: YOUR AGE - 220 = MAX HeartRate   Remember! Do not push yourself too hard.  Start slowly and build up your pace, speed, weight, time in exercise, etc.  Allow  your body to rest between exercise and get good sleep. You will need more water than normal when you are exerting yourself. Do not wait until you are thirsty to drink. Drink with a purpose of getting in at least 8, 8 ounce glasses of water a day plus more depending on how much you exercise and sweat.    If you begin to develop dizziness, chest pain, abdominal pain, jaw pain, shortness of breath, headache, vision changes, lightheadedness, or other concerning symptoms, stop the activity and allow your body to rest. If your symptoms are severe, seek emergency evaluation immediately. If your symptoms are concerning, but not severe, please let us know so that we can recommend further evaluation.

## 2021-09-25 NOTE — Assessment & Plan Note (Signed)
Balance issues with a history of fall related to these. She has had a work-up by ENT and found that there was no vestibular cause for this. She did subsequently start physical therapy and feels that this has been exceptionally helpful for her. She does appear very steady and stable on her feet today and she is strong in her upper and lower extremities.  She has no deficits or weakness notable.  She does have a cane with her today and reports using this to help with stability but she does plan on stopping use in the near future as she feels more competent. No concerns today.  Do recommend she continue with physical therapy as this has been exceptionally helpful for her.

## 2021-09-25 NOTE — Progress Notes (Signed)
Orma Render, DNP, AGNP-c Primary Care & Sports Medicine [address]   [place], [place] [address] [phone]  New patient visit   Patient: Lisa Moore   DOB: 1951/01/30   71 y.o. Female  MRN: [id number] Visit Date: 09/25/2021  Patient Care Team: Lindsey Hommel, Coralee Pesa, NP as PCP - General (Nurse Practitioner) Lelon Perla, MD as PCP - Cardiology (Cardiology) Otis Brace, MD as Consulting Physician (Gastroenterology)  Today's healthcare provider: Orma Render, NP   Chief Complaint  Patient presents with   Establish Care    Prior PCP with [facility]. Patient has no acute concerns or complaints today she just wishes to establish care. She is being followed by Cardiology for Afib and recently had an ablation.    Subjective    HPI HPI     Establish Care    Additional comments: Prior PCP with [facility] Patient has no acute concerns or complaints today she just wishes to establish care. She is being followed by Cardiology for Afib and recently had an ablation.       Last edited by Bobby Rumpf, [place] on 09/25/2021 11:10 AM.      Lisa Moore is a 71 y.o. female who presents today as a new patient to establish care.    Lisa Moore reports a medical history of Afib with ablation in spring 2022. Since her ablation she reports no symptoms of atrial fibrillation, shortness of breath, chest pain, palpitations.  She is currently managed by cardiology and has a follow-up appointment next month for which she hopes she will be released.  She tells me that she is a widow however she has an excellent support system and feels safe in her home in her environment. She enjoys gardening and walking. She does tell me that she has been working with PT for the last several months after a fall approximately 1 year ago while working out in her garden.  She reports that she lost her footing and fell however she had no injuries from the fall.  She tells me after  the fall she did not realize how unstable she was on her feet but she feels much better with the physical therapy and is walking more confidently at this time. She tells me she eats a normal diet without any specific restrictions. She is due for her mammogram and bone density screening.  She has had both of these in the past neither 1 of ever had abnormal findings. She is also due for her shingles and tetanus vaccines. She reports her last lab work was in the fall 2021.  She does not recall any abnormal findings.  She is currently only taking Cardizem 120 mg once daily and no other routine medications. She denies any concerns with depression or anxiety symptoms. She reports overall she is very healthy and lives an active healthy lifestyle.   Past Medical History:  Diagnosis Date   Arthritis    Benign hematuria    GERD (gastroesophageal reflux disease)    Migraines    rare   Myocardial infarction [facility]) 2022   Ablation 2022   Persistent atrial fibrillation ([place]) 05/21/2020   Typical atrial flutter ([place]) 11/13/2020   Past Surgical History:  Procedure Laterality Date   ABDOMINAL HYSTERECTOMY     APPENDECTOMY     ATRIAL FIBRILLATION ABLATION N/A 01/13/2021   Procedure: ATRIAL FIBRILLATION ABLATION;  Surgeon: Thompson Grayer, MD;  Location: [facility] CV LAB;  Service: Cardiovascular;  Laterality: N/A;   CARDIOVERSION N/A 07/07/2020   Procedure: CARDIOVERSION;  Surgeon: Josue Hector, MD;  Location: [facility] ENDOSCOPY;  Service: Cardiovascular;  Laterality: N/A;   CARDIOVERSION N/A 11/07/2020   Procedure: CARDIOVERSION;  Surgeon: Josue Hector, MD;  Location[facility] ENDOSCOPY;  Service: Cardiovascular;  Laterality: N/A;   Family Status  Relation Name Status   Mother  Deceased   Father  Deceased   Sister Lisa Moore Deceased   Brother Lisa Moore Alive   MGM  Deceased   MGF  Deceased   PGM  Deceased   PGF  Deceased   Family History  Problem Relation Age of Onset   Hypertension Mother    Cancer  Father    COPD Father    Diabetes Father    Cancer Sister    Kidney disease Maternal Grandmother    Cancer Paternal Grandmother    Social History   Socioeconomic History   Marital status: Widowed    Spouse name: Not on file   Number of children: Not on file   Years of education: Not on file   Highest education level: Not on file  Occupational History   Not on file  Tobacco Use   Smoking status: Never   Smokeless tobacco: Never  Vaping Use   Vaping Use: Never used  Substance and Sexual Activity   Alcohol use: Yes    Alcohol/week: 1.0 standard drink    Types: 1 Shots of liquor per week    Comment: occasional beer few times a month, not regularly   Drug use: No   Sexual activity: Not Currently  Other Topics Concern   Not on file  Social History Narrative   Not on file   Social Determinants of Health   Financial Resource Strain: Not on file  Food Insecurity: Not on file  Transportation Needs: Not on file  Physical Activity: Not on file  Stress: Not on file  Social Connections: Not on file   Outpatient Medications Prior to Visit  Medication Sig   acetaminophen (TYLENOL) 500 MG tablet Take 1,000 mg by mouth every 6 (six) hours as needed for moderate pain or headache.   diltiazem (CARDIZEM CD) 120 MG 24 hr capsule Take 1 capsule (120 mg total) by mouth daily.   augmented betamethasone dipropionate (DIPROLENE-AF) 0.05 % cream Apply topically.   No facility-administered medications prior to visit.   No Known Allergies  Immunization History  Administered Date(s) Administered   Influenza, High Dose Seasonal PF 07/03/2018   Influenza-Unspecified 06/19/2020, 05/28/2021   PFIZER Comirnaty(Gray Top)Covid-19 Tri-Sucrose Vaccine 05/28/2021   PFIZER(Purple Top)SARS-COV-2 Vaccination 10/25/2019, 11/19/2019, 06/19/2020   Pneumococcal Conjugate-13 03/31/2018    Health Maintenance  Topic Date Due   TETANUS/TDAP  Never done   COLONOSCOPY (Pts 45-96yrs Insurance coverage will  need to be confirmed)  Never done   DEXA SCAN  Never done   Pneumonia Vaccine 58+ Years old (2 - PPSV23 if available, else PCV20) 04/01/2019   MAMMOGRAM  04/03/2020   COVID-19 Vaccine (5 - Booster for [place] series) 07/23/2021   INFLUENZA VACCINE  Completed   Hepatitis C Screening  Completed   HPV VACCINES  Aged Out   Zoster Vaccines- Shingrix  Discontinued    Patient Care Team: Codi Folkerts, Coralee Pesa, NP as PCP - General (Nurse Practitioner) Lelon Perla, MD as PCP - Cardiology (Cardiology) Otis Brace, MD as Consulting Physician (Gastroenterology)  Review of Systems All review of systems negative except what is listed in the HPI   Objective  BP 110/74    Pulse 74    Ht 5\' 5"  (1.651 m)    Wt 195 lb (88.5 kg)    LMP  (LMP Unknown)    SpO2 99%    BMI 32.45 kg/m  Physical Exam Vitals and nursing note reviewed.  Constitutional:      General: She is not in acute distress.    Appearance: Normal appearance.  HENT:     Right Ear: Tympanic membrane normal.     Left Ear: Tympanic membrane normal.     Nose: Nose normal.     Mouth/Throat:     Mouth: Mucous membranes are moist.     Pharynx: Oropharynx is clear.  Eyes:     Extraocular Movements: Extraocular movements intact.     Conjunctiva/sclera: Conjunctivae normal.     Pupils: Pupils are equal, round, and reactive to light.  Neck:     Vascular: No carotid bruit.  Cardiovascular:     Rate and Rhythm: Normal rate and regular rhythm.     Pulses: Normal pulses.     Heart sounds: Normal heart sounds. No murmur heard. Pulmonary:     Effort: Pulmonary effort is normal.     Breath sounds: Normal breath sounds. No wheezing.  Abdominal:     General: Bowel sounds are normal.     Palpations: Abdomen is soft.  Musculoskeletal:        General: Normal range of motion.     Cervical back: Normal range of motion.     Right lower leg: No edema.     Left lower leg: No edema.  Skin:    General: Skin is warm and dry.     Capillary  Refill: Capillary refill takes less than 2 seconds.  Neurological:     General: No focal deficit present.     Mental Status: She is alert and oriented to person, place, and time.     Sensory: No sensory deficit.     Motor: No weakness.     Coordination: Coordination normal.     Gait: Gait normal.  Psychiatric:        Mood and Affect: Mood normal.        Behavior: Behavior normal.        Thought Content: Thought content normal.        Judgment: Judgment normal.    Depression Screen PHQ 2/9 Scores 09/25/2021 03/31/2018  PHQ - 2 Score 0 0   No results found for any visits on 09/25/21.  Assessment & Plan      Problem List Items Addressed This Visit     Balance problems    Balance issues with a history of fall related to these. She has had a work-up by ENT and found that there was no vestibular cause for this. She did subsequently start physical therapy and feels that this has been exceptionally helpful for her. She does appear very steady and stable on her feet today and she is strong in her upper and lower extremities.  She has no deficits or weakness notable.  She does have a cane with her today and reports using this to help with stability but she does plan on stopping use in the near future as she feels more competent. No concerns today.  Do recommend she continue with physical therapy as this has been exceptionally helpful for her.      History of atrial fibrillation    History of atrial fibrillation with ablation 01/13/2021. Heart rate regular today with  no signs of ectopy or abnormal beats. No symptoms present today. Patient is to follow-up with cardiology next month.  We will follow along to evaluate recommendations.      Relevant Orders   Comprehensive metabolic panel   CBC with Differential/Platelet   Lipid panel   Other Visit Diagnoses     Screening for deficiency anemia    -  Primary   Relevant Orders   CBC with Differential/Platelet   Screening for lipid  disorders       Relevant Orders   Lipid panel   Screening for endocrine, nutritional, metabolic and immunity disorder       Relevant Orders   Comprehensive metabolic panel   CBC with Differential/Platelet   Lipid panel   Screening mammogram for breast cancer       Relevant Orders   MM Digital Screening   Screening for osteoporosis       Relevant Orders   DG Bone Density   Need for shingles vaccine       Relevant Medications   Zoster Vaccine Adjuvanted [facility]) injection   Need for tetanus booster       Relevant Medications   Tdap (BOOSTRIX) 5-2.5-18.5 LF-MCG/0.5 injection   Asymptomatic menopausal state       Relevant Orders   DG Bone Density        Return in about 1 year (around 09/25/2022) for [place].      Evoleth Nordmeyer, Coralee Pesa, NP, DNP, AGNP-C Primary Care & Sports Medicine at [place]

## 2021-09-25 NOTE — Assessment & Plan Note (Signed)
History of atrial fibrillation with ablation 01/13/2021. Heart rate regular today with no signs of ectopy or abnormal beats. No symptoms present today. Patient is to follow-up with cardiology next month.  We will follow along to evaluate recommendations.

## 2021-09-29 ENCOUNTER — Ambulatory Visit (HOSPITAL_BASED_OUTPATIENT_CLINIC_OR_DEPARTMENT_OTHER): Payer: Medicare Other

## 2021-09-29 ENCOUNTER — Other Ambulatory Visit (HOSPITAL_BASED_OUTPATIENT_CLINIC_OR_DEPARTMENT_OTHER): Payer: Self-pay

## 2021-09-29 ENCOUNTER — Ambulatory Visit: Payer: Medicare Other | Admitting: Physical Therapy

## 2021-09-29 ENCOUNTER — Other Ambulatory Visit: Payer: Self-pay

## 2021-09-29 DIAGNOSIS — R296 Repeated falls: Secondary | ICD-10-CM | POA: Diagnosis not present

## 2021-09-29 DIAGNOSIS — Z130 Encounter for screening for diseases of the blood and blood-forming organs and certain disorders involving the immune mechanism: Secondary | ICD-10-CM | POA: Diagnosis not present

## 2021-09-29 DIAGNOSIS — I519 Heart disease, unspecified: Secondary | ICD-10-CM | POA: Diagnosis not present

## 2021-09-29 DIAGNOSIS — Z13220 Encounter for screening for lipoid disorders: Secondary | ICD-10-CM | POA: Diagnosis not present

## 2021-09-29 DIAGNOSIS — R2681 Unsteadiness on feet: Secondary | ICD-10-CM

## 2021-09-29 DIAGNOSIS — Z1329 Encounter for screening for other suspected endocrine disorder: Secondary | ICD-10-CM | POA: Diagnosis not present

## 2021-09-29 DIAGNOSIS — R262 Difficulty in walking, not elsewhere classified: Secondary | ICD-10-CM | POA: Diagnosis not present

## 2021-09-29 DIAGNOSIS — Z13228 Encounter for screening for other metabolic disorders: Secondary | ICD-10-CM | POA: Diagnosis not present

## 2021-09-29 DIAGNOSIS — Z1321 Encounter for screening for nutritional disorder: Secondary | ICD-10-CM | POA: Diagnosis not present

## 2021-09-29 MED ORDER — ZOSTER VAC RECOMB ADJUVANTED 50 MCG/0.5ML IM SUSR
INTRAMUSCULAR | 1 refills | Status: DC
  Filled 2021-09-29: qty 0.5, 1d supply, fill #0
  Filled 2022-04-28: qty 0.5, 1d supply, fill #1

## 2021-09-29 MED ORDER — TETANUS-DIPHTH-ACELL PERTUSSIS 5-2.5-18.5 LF-MCG/0.5 IM SUSY
PREFILLED_SYRINGE | INTRAMUSCULAR | 0 refills | Status: DC
  Filled 2021-10-12: qty 0.5, 1d supply, fill #0

## 2021-09-29 NOTE — Therapy (Signed)
[facility] [address] [place], [place], [address] Phone: [phone]   Fax:  [phone]  Physical Therapy Treatment  Patient Details  Name: Lisa Moore MRN: [id number] Date of Birth: 1951/02/06 Referring Provider (PT): Blenda Nicely, Henderson Cloud, MD   Encounter Date: 09/29/2021   PT End of Session - 09/29/21 1240     Visit Number 22    Number of Visits 25    Date for PT Re-Evaluation 10/13/21    Authorization Type Medicare    Progress Note Due on Visit 70    PT Start Time 1030    PT Stop Time 1108    PT Time Calculation (min) 38 min    Activity Tolerance Patient tolerated treatment well    Behavior During Therapy [facility] for tasks assessed/performed             Past Medical History:  Diagnosis Date   Arthritis    Benign hematuria    GERD (gastroesophageal reflux disease)    Migraines    rare   Myocardial infarction ([place]) 2022   Ablation 2022   Persistent atrial fibrillation ([place]) 05/21/2020   Typical atrial flutter ([place]) 11/13/2020    Past Surgical History:  Procedure Laterality Date   ABDOMINAL HYSTERECTOMY     APPENDECTOMY     ATRIAL FIBRILLATION ABLATION N/A 01/13/2021   Procedure: ATRIAL FIBRILLATION ABLATION;  Surgeon: Thompson Grayer, MD;  Location: [place] [facility] LAB;  Service: Cardiovascular;  Laterality: N/A;   CARDIOVERSION N/A 07/07/2020   Procedure: CARDIOVERSION;  Surgeon: Josue Hector, MD;  Location: [facility] ENDOSCOPY;  Service: Cardiovascular;  Laterality: N/A;   CARDIOVERSION N/A 11/07/2020   Procedure: CARDIOVERSION;  Surgeon: Josue Hector, MD;  Location: [facility] ENDOSCOPY;  Service: Cardiovascular;  Laterality: N/A;    There were no vitals filed for this visit.   Subjective Assessment - 09/29/21 1238     Subjective Met with new PCP, was very thorough.  Had blood work done this morning; going for Shingles vaccine after aquatic therapy today.  Feeling a little more off balance today.     Pertinent History OA, Migraines, a-fib with cardioversion - pt elected to stop Eliquis    Limitations Standing;Walking;House hold activities    Diagnostic tests MRI in 2021: negative brain MRI for age, no acute abnormality    Patient Stated Goals To remain independent, prevent falls, be able to garden, to be able to get off the floor independently.    Currently in Pain? No/denies             Patient seen for aquatic therapy today.  Treatment took place in water 3-4 feet deep depending upon activity.  Pt entered and exited the pool via stairs, step to sequence without UE support on rails, leading with LLE  Warm up walking forwards from shallow <> 4 feet deep; pt required one UE support on therapist due to increased imbalance today.  Performed 3 trips down and back.  Changed to forwards and retro walking adding in head turns and head nods, still with one UE support on therapist.  Changed to walking across width of pool at 3.5 ft of water while performing 2 trips down and back alternating high knee marches with contralateral UE tap to knee with pt keeping gaze forwards; required min A for balance.  Progressed high knee marching with contralateral UE tap to knee to include head and trunk rotation when reaching across midline to tap; pt required intermittent mod A for balance  and frequent cues to keep COG moving forwards when marching; increased time required to perform.  Holding two noodles performing walking across width of the pool performing opposite UE and LE advancement (moving cross country skiing) x 2 trips; 1st trip PT stood behind pt and facilitated reciprocal UE swing and cues for LE advancement; second trip pt performed without therapist cues.  SLS activities standing on submerged step further away from wall but still holding bilat noodles for more unstable UE support: performed 3 way step downs: forwards, ipsilateral, retro x 5 reps each LE with therapist providing min-mod A during  anterior step downs.  Returned to pool floor and performed sustained SLS with Leg swings ant<>post x 10 reps each LE; pt holding bilat noodles and therapist assisting with stabilizing noodles and keeping weight shifted over one LE.  Pt requires buoyancy of water for support for joint offloading for pain reduction in R hip, to reduce fall risk with gait training and balance exercises with minimal UE support; exercises able to be performed safely in water without the risk of fall compared to those same exercises performed on land;  viscosity of water needed for resistance for strengthening. Current of water provides perturbations for challenging static & dynamic standing balance and visual-vestibular interactions.     PT Short Term Goals - 09/03/21 1148       PT SHORT TERM GOAL #1   Title = LTG               PT Long Term Goals - 09/20/21 1628       PT LONG TERM GOAL #1   Title Pt will demonstrate independence with final HEP, will return to walking program 2-3x/week with LRAD, and will return to chair yoga at [facility]    Time 4    Period Weeks    Status On-going    Target Date 10/13/21      PT LONG TERM GOAL #2   Title Pt will demonstrate ability to stand from floor without the use of UE MOD I    Time 4    Period Weeks    Status Revised    Target Date 10/13/21      PT LONG TERM GOAL #3   Title Pt will increase FGA score to >/= 22/30 to indicate decreased falls risk    Baseline 18/30    Time 4    Period Weeks    Status Revised    Target Date 10/13/21      PT LONG TERM GOAL #5   Title Pt will ambulate x 1000 over variety of surfaces, up/down curb, and across parking lot with no AD, independently and perform visual scanning for safety without evidence of imbalance    Baseline Met with trekking pole; will upgrade to independent without trekking pole    Time 4    Period Weeks    Status Revised    Target Date 10/13/21                   Plan - 09/29/21 1241      Clinical Impression Statement Pt required increased UE support for balance today when performing general ambulation and ambulation with head turns.  Incorporated more SLS and trunk rotation activities today in aquatic environment.  Demonstrated improved sequencing of reciprocal gait and UE swing but continues to have increased difficulty with forward weight shifting and step downs.  Will continue to address.    Personal Factors and Comorbidities Comorbidity 2;Past/Current Experience;Social Background  Examination-Activity Limitations Bend;Locomotion Level;Stairs;Stand;Squat;Transfers    Examination-Participation Restrictions Cleaning;Community Activity;Yard Work    Rehab Potential Good    PT Frequency 2x / week    PT Duration 4 weeks    PT Treatment/Interventions ADLs/Self Care Home Management;Aquatic Therapy;Canalith Repostioning;DME Instruction;Gait training;Stair training;Functional mobility training;Therapeutic activities;Therapeutic exercise;Balance training;Neuromuscular re-education;Patient/family education;Vestibular    PT Next Visit Plan Aquatic- SLS activities, curb/step downs, EC.  VOR cancellation/x2 viewing  LAND: SLS on RLE glute med and rotation in closed chain, stepping over obstacles, increased stride length with trekking poles, up/down ramp without breaking stride.  Ramp with mat over it.  Continue balance training with EC. Working in more open areas and changing between light and dark environments.    Consulted and Agree with Plan of Care Patient             Patient will benefit from skilled therapeutic intervention in order to improve the following deficits and impairments:  Abnormal gait, Decreased balance, Difficulty walking, Impaired perceived functional ability, Other (comment) (disequilibrium)  Visit Diagnosis: Difficulty in walking, not elsewhere classified  Repeated falls  Unsteadiness on feet     Problem List Patient Active Problem List   Diagnosis Date  Noted   History of atrial fibrillation 09/25/2021   Heart disease, unspecified 09/25/2021   Balance problems 05/21/2020    Rico Junker, PT, DPT 09/29/21    12:57 PM    [facility] [address] [place], [place], [address] Phone: [phone]   Fax:  [phone]  Name: Lisa Moore MRN: [id number] Date of Birth: June 07, 1951

## 2021-09-30 ENCOUNTER — Encounter (HOSPITAL_BASED_OUTPATIENT_CLINIC_OR_DEPARTMENT_OTHER): Payer: Self-pay | Admitting: Nurse Practitioner

## 2021-09-30 LAB — COMPREHENSIVE METABOLIC PANEL
ALT: 10 IU/L (ref 0–32)
AST: 13 IU/L (ref 0–40)
Albumin/Globulin Ratio: 1.6 (ref 1.2–2.2)
Albumin: 4.6 g/dL (ref 3.7–4.7)
Alkaline Phosphatase: 101 IU/L (ref 44–121)
BUN/Creatinine Ratio: 19 (ref 12–28)
BUN: 17 mg/dL (ref 8–27)
Bilirubin Total: 0.6 mg/dL (ref 0.0–1.2)
CO2: 20 mmol/L (ref 20–29)
Calcium: 9.6 mg/dL (ref 8.7–10.3)
Chloride: 102 mmol/L (ref 96–106)
Creatinine, Ser: 0.9 mg/dL (ref 0.57–1.00)
Globulin, Total: 2.8 g/dL (ref 1.5–4.5)
Glucose: 114 mg/dL — ABNORMAL HIGH (ref 70–99)
Potassium: 4.2 mmol/L (ref 3.5–5.2)
Sodium: 139 mmol/L (ref 134–144)
Total Protein: 7.4 g/dL (ref 6.0–8.5)
eGFR: 68 mL/min/{1.73_m2} (ref >59)

## 2021-09-30 LAB — CBC WITH DIFFERENTIAL/PLATELET
Basophils Absolute: 0.1 10*3/uL (ref 0.0–0.2)
Basos: 1 %
EOS (ABSOLUTE): 0.1 10*3/uL (ref 0.0–0.4)
Eos: 1 %
Hematocrit: 44.2 % (ref 34.0–46.6)
Hemoglobin: 14.9 g/dL (ref 11.1–15.9)
Immature Grans (Abs): 0 10*3/uL (ref 0.0–0.1)
Immature Granulocytes: 0 %
Lymphocytes Absolute: 4.2 10*3/uL — ABNORMAL HIGH (ref 0.7–3.1)
Lymphs: 34 %
MCH: 30.3 pg (ref 26.6–33.0)
MCHC: 33.7 g/dL (ref 31.5–35.7)
MCV: 90 fL (ref 79–97)
Monocytes Absolute: 0.8 10*3/uL (ref 0.1–0.9)
Monocytes: 6 %
Neutrophils Absolute: 7.1 10*3/uL — ABNORMAL HIGH (ref 1.4–7.0)
Neutrophils: 58 %
Platelets: 352 10*3/uL (ref 150–450)
RBC: 4.91 x10E6/uL (ref 3.77–5.28)
RDW: 11.9 % (ref 11.7–15.4)
WBC: 12.3 10*3/uL — ABNORMAL HIGH (ref 3.4–10.8)

## 2021-09-30 LAB — LIPID PANEL
Chol/HDL Ratio: 3.3 ratio (ref 0.0–4.4)
Cholesterol, Total: 233 mg/dL — ABNORMAL HIGH (ref 100–199)
HDL: 71 mg/dL (ref >39)
LDL Chol Calc (NIH): 144 mg/dL — ABNORMAL HIGH (ref 0–99)
Triglycerides: 105 mg/dL (ref 0–149)
VLDL Cholesterol Cal: 18 mg/dL (ref 5–40)

## 2021-10-01 ENCOUNTER — Ambulatory Visit: Payer: Medicare Other | Admitting: Physical Therapy

## 2021-10-01 ENCOUNTER — Other Ambulatory Visit: Payer: Self-pay

## 2021-10-01 DIAGNOSIS — R262 Difficulty in walking, not elsewhere classified: Secondary | ICD-10-CM | POA: Diagnosis not present

## 2021-10-01 DIAGNOSIS — R2681 Unsteadiness on feet: Secondary | ICD-10-CM

## 2021-10-01 DIAGNOSIS — R296 Repeated falls: Secondary | ICD-10-CM

## 2021-10-01 NOTE — Patient Instructions (Addendum)
Feet Heel-Toe "Tandem"    Arms outstretched, walk a straight line bringing one foot directly in front of the other.  Go slowly and with each step forwards, turn your head to look left or right.   Repeat for 4 laps per session. Do __2__ sessions per day.  Walk forwards and Backwards: EYES CLOSED   Right hand on the counter - Walk forwards with eyes closed, slowly, keeping your balance.  At the end of the counter, walk backwards with eyes closed; right hand on the counter. Repeat 4 times.   Step-Up: Forward <> Backward on Pillow    Standing in the corner, pillow in front of you and chair for support. High Step forward on to the pillow with your right foot, followed with left.  Step back and down off the pillow.  Repeat 5 times leading with right leg. Switch and lead with the left leg, 5 times.    FUNCTIONAL MOBILITY: Marching in place with opposite arm touch - Standing    March in place by lifting left leg up, reach across with right hand and tap left knee. Put that foot down and switch, lift right leg up, reach across with left hand and tap right knee. Go slowly and alternate for 60 seconds.  Do 2 sets.  ____________________________________________________________________________________   Charyl Bigger, One Leg    Start with both feet on bed.  Squeeze your butt muscles, lift your hips. Keep the hips lifted while you lift one foot up.  Foot down, hips down. Repeat, lifting the other foot. Repeat __5__ times each leg.    Hip Abduction: Modified    Lying on right side, right knee and hip bent.  Top leg out straight. Lift the top leg up towards the ceiling (doesn't have to be too high), keeping the pelvis rolled forwards but keep the leg from drifting forwards.   Repeat __10__ times per side.   Hip Abduction: Side-Stepping: Monster Steps (Eccentric)    Band around thighs.  Keeping the feet forwards, sit in a small squat and perform side steps against the resistance  of the band to the right and then switch and step back to the left.  4 laps down and back, __2_ sets per day.

## 2021-10-02 NOTE — Therapy (Signed)
[facility] [address] [place], [place], [address] Phone: [phone]   Fax:  [phone]  Physical Therapy Treatment  Patient Details  Name: Lisa Moore MRN: [id number] Date of Birth: 1951/04/12 Referring Provider (PT): Blenda Nicely, Henderson Cloud, MD   Encounter Date: 10/01/2021   PT End of Session - 10/02/21 1427     Visit Number 23    Number of Visits 25    Date for PT Re-Evaluation 10/13/21    Authorization Type Medicare    Progress Note Due on Visit 33    PT Start Time 0856    PT Stop Time 0939    PT Time Calculation (min) 43 min    Activity Tolerance Patient tolerated treatment well    Behavior During Therapy [facility] for tasks assessed/performed             Past Medical History:  Diagnosis Date   Arthritis    Benign hematuria    GERD (gastroesophageal reflux disease)    Migraines    rare   Myocardial infarction [facility]) 2022   Ablation 2022   Persistent atrial fibrillation ([place]) 05/21/2020   Typical atrial flutter ([place]) 11/13/2020    Past Surgical History:  Procedure Laterality Date   ABDOMINAL HYSTERECTOMY     APPENDECTOMY     ATRIAL FIBRILLATION ABLATION N/A 01/13/2021   Procedure: ATRIAL FIBRILLATION ABLATION;  Surgeon: Thompson Grayer, MD;  Location: [place] CV LAB;  Service: Cardiovascular;  Laterality: N/A;   CARDIOVERSION N/A 07/07/2020   Procedure: CARDIOVERSION;  Surgeon: Josue Hector, MD;  Location: [facility] ENDOSCOPY;  Service: Cardiovascular;  Laterality: N/A;   CARDIOVERSION N/A 11/07/2020   Procedure: CARDIOVERSION;  Surgeon: Josue Hector, MD;  Location: [facility] ENDOSCOPY;  Service: Cardiovascular;  Laterality: N/A;    There were no vitals filed for this visit.   Subjective Assessment - 10/01/21 0858     Subjective After pool pt kept working on walking with alternating UE swing with trekking poles.  R low back is more painful today 4/10.  Had lab work done that showed some immune  activity.  No dizziness.    Pertinent History OA, Migraines, a-fib with cardioversion - pt elected to stop Eliquis    Limitations Standing;Walking;House hold activities    Diagnostic tests MRI in 2021: negative brain MRI for age, no acute abnormality    Patient Stated Goals To remain independent, prevent falls, be able to garden, to be able to get off the floor independently.    Currently in Pain? Yes    Pain Score 4               Vestibular Treatment/Exercise - 10/01/21 6195       Vestibular Treatment/Exercise   Vestibular Treatment Provided Gaze    Gaze Exercises X1 Viewing Horizontal;X1 Viewing Vertical;X2 Viewing Horizontal;X2 Viewing Vertical      X1 Viewing Horizontal   Foot Position feet together solid surface, plain background > busy background; feet together on pillow, busy background    Reps 3    Comments 60 seconds, no symptoms today      X1 Viewing Vertical   Foot Position feet together solid surface, plain background > busy background; feet together on pillow, busy background    Reps 3    Comments 60 seconds, no symptoms today      X2 Viewing Horizontal   Foot Position standing, solid surface, feet apart    Reps 1     Comments 30 seconds with  therapist moving target; no symptoms      X2 Viewing Vertical   Foot Position standing, solid surface, feet apart    Reps 1    Comments 30 seconds with therapist moving target; no symptoms             Updated the following dynamic balance exercises below by adding in head turns or keeping eyes closed.  Added knee taps to standing marching to facilitate increased trunk, head, vision rotation and increase SLS time.  Pt return demonstrated each exercise safely; provided cues for how to perform safely at home  Feet Heel-Toe "Tandem"    Arms outstretched, walk a straight line bringing one foot directly in front of the other.  Go slowly and with each step forwards, turn your head to look left or right.   Repeat for  4 laps per session. Do __2__ sessions per day.  Walk forwards and Backwards: EYES CLOSED   Right hand on the counter - Walk forwards with eyes closed, slowly, keeping your balance.  At the end of the counter, walk backwards with eyes closed; right hand on the counter. Repeat 4 times.   FUNCTIONAL MOBILITY: Marching in place with opposite arm touch - Standing    March in place by lifting left leg up, reach across with right hand and tap left knee. Put that foot down and switch, lift right leg up, reach across with left hand and tap right knee. Go slowly and alternate for 60 seconds.  Do 2 sets.      PT Education - 10/02/21 1427     Education Details updated balance and vestibular HEP    Person(s) Educated Patient    Methods Explanation;Demonstration;Handout    Comprehension Verbalized understanding;Returned demonstration              PT Short Term Goals - 09/03/21 1148       PT SHORT TERM GOAL #1   Title = LTG               PT Long Term Goals - 09/20/21 1628       PT LONG TERM GOAL #1   Title Pt will demonstrate independence with final HEP, will return to walking program 2-3x/week with LRAD, and will return to chair yoga at [facility]    Time 4    Period Weeks    Status On-going    Target Date 10/13/21      PT LONG TERM GOAL #2   Title Pt will demonstrate ability to stand from floor without the use of UE MOD I    Time 4    Period Weeks    Status Revised    Target Date 10/13/21      PT LONG TERM GOAL #3   Title Pt will increase FGA score to >/= 22/30 to indicate decreased falls risk    Baseline 18/30    Time 4    Period Weeks    Status Revised    Target Date 10/13/21      PT LONG TERM GOAL #5   Title Pt will ambulate x 1000 over variety of surfaces, up/down curb, and across parking lot with no AD, independently and perform visual scanning for safety without evidence of imbalance    Baseline Met with trekking pole; will upgrade to independent without  trekking pole    Time 4    Period Weeks    Status Revised    Target Date 10/13/21  Plan - 10/02/21 1428     Clinical Impression Statement Treatment session focused on progression of standing balance HEP.  Attempted to progress VOR by adding busy background and then with x2 viewing but pt continues to report no symptoms of dizziness; removed from HEP.  Will continue to focus on dynamic standing balance as this may have been a major source of patient's dizziness.    Personal Factors and Comorbidities Comorbidity 2;Past/Current Experience;Social Background    Examination-Activity Limitations Bend;Locomotion Level;Stairs;Stand;Squat;Transfers    Examination-Participation Restrictions Cleaning;Community Activity;Yard Work    Rehab Potential Good    PT Frequency 2x / week    PT Duration 4 weeks    PT Treatment/Interventions ADLs/Self Care Home Management;Aquatic Therapy;Canalith Repostioning;DME Instruction;Gait training;Stair training;Functional mobility training;Therapeutic activities;Therapeutic exercise;Balance training;Neuromuscular re-education;Patient/family education;Vestibular    PT Next Visit Plan Aquatic- SLS activities, curb/step downs, EC.  VOR cancellation   LAND: SLS on RLE glute med and rotation in closed chain, stepping over obstacles, increased stride length with trekking poles, up/down ramp without breaking stride.  Ramp with mat over it.  Continue balance training with EC.    Consulted and Agree with Plan of Care Patient             Patient will benefit from skilled therapeutic intervention in order to improve the following deficits and impairments:  Abnormal gait, Decreased balance, Difficulty walking, Impaired perceived functional ability, Other (comment) (disequilibrium)  Visit Diagnosis: Difficulty in walking, not elsewhere classified  Repeated falls  Unsteadiness on feet     Problem List Patient Active Problem List   Diagnosis Date  Noted   History of atrial fibrillation 09/25/2021   Heart disease, unspecified 09/25/2021   Balance problems 05/21/2020   Rico Junker, PT, DPT 10/02/21    2:33 PM    [facility] [address] [place], [place], [address] Phone: [phone]   Fax:  [phone]  Name: Lisa Moore MRN: [id number] Date of Birth: [birth year]

## 2021-10-06 ENCOUNTER — Ambulatory Visit: Payer: Medicare Other | Admitting: Physical Therapy

## 2021-10-06 ENCOUNTER — Other Ambulatory Visit: Payer: Self-pay

## 2021-10-06 DIAGNOSIS — R262 Difficulty in walking, not elsewhere classified: Secondary | ICD-10-CM | POA: Diagnosis not present

## 2021-10-06 DIAGNOSIS — R2681 Unsteadiness on feet: Secondary | ICD-10-CM

## 2021-10-06 DIAGNOSIS — R296 Repeated falls: Secondary | ICD-10-CM

## 2021-10-06 NOTE — Therapy (Signed)
[facility] [address] [place], [place], [address] Phone: [phone]   Fax:  [phone]  Physical Therapy Treatment  Patient Details  Name: Lisa Moore MRN: [id number] Date of Birth: Jul 20, 1951 Referring Provider (PT): Gavin Pound, MD   Encounter Date: 10/06/2021   PT End of Session - 10/06/21 1439     Visit Number 24    Number of Visits 25    Date for PT Re-Evaluation 10/13/21    Authorization Type Medicare    Progress Note Due on Visit 11    PT Start Time 1025    PT Stop Time 1105    PT Time Calculation (min) 40 min    Activity Tolerance Patient tolerated treatment well    Behavior During Therapy [facility] for tasks assessed/performed             Past Medical History:  Diagnosis Date   Arthritis    Benign hematuria    GERD (gastroesophageal reflux disease)    Migraines    rare   Myocardial infarction ([place]) 2022   Ablation 2022   Persistent atrial fibrillation ([place]) 05/21/2020   Typical atrial flutter ([place]) 11/13/2020    Past Surgical History:  Procedure Laterality Date   ABDOMINAL HYSTERECTOMY     APPENDECTOMY     ATRIAL FIBRILLATION ABLATION N/A 01/13/2021   Procedure: ATRIAL FIBRILLATION ABLATION;  Surgeon: Thompson Grayer, MD;  Location: [place] [facility];  Service: Cardiovascular;  Laterality: N/A;   CARDIOVERSION N/A 07/07/2020   Procedure: CARDIOVERSION;  Surgeon: Josue Hector, MD;  Location: [facility] ENDOSCOPY;  Service: Cardiovascular;  Laterality: N/A;   CARDIOVERSION N/A 11/07/2020   Procedure: CARDIOVERSION;  Surgeon: Josue Hector, MD;  Location: [facility] ENDOSCOPY;  Service: Cardiovascular;  Laterality: N/A;    There were no vitals filed for this visit.   Subjective Assessment - 10/06/21 1438     Subjective Did not use trekking pole when entering clinic and pool today!  Back is feeling better.  No dizziness.    Pertinent History OA, Migraines, a-fib with cardioversion - pt  elected to stop Eliquis    Limitations Standing;Walking;House hold activities    Diagnostic tests MRI in 2021: negative brain MRI for age, no acute abnormality    Patient Stated Goals To remain independent, prevent falls, be able to garden, to be able to get off the floor independently.    Currently in Pain? Yes    Pain Score 2     Pain Location Back    Pain Orientation Right              Patient seen for aquatic therapy today.  Treatment took place in water 3-4 feet deep depending upon activity.  Pt entered and exited the pool via stairs with very light UE support.   For warm up, attempted to perform forwards walking in deeper water, 4 ft, with cues to perform reciprocal UE swing and increased step length.  PT provided min A for balance.  Pt unable to maintain balance and perform reciprocal, step through gait pattern after 1-2 laps.  Returned to 3.4 ft depth to continue ambulation with step through pattern, reciprocal UE swing, and intermittent head turns/visual scanning to L and R.  Continued higher level dynamic balance and gait training with tandem gait x 4 laps down and back with light UE support on wall.  Performed fast walking forwards in shallow water with quick 180 turns and focusing on maintaining forward momentum and gait  speed - with faster walking speed pt demonstrated improved step through gait sequence and reciprocal UE swing.   Performed modified SLS with one foot on step - pt demonstrated increased weight shift forwards over elevated LE and when PT attempted to shift COG back over stance LE pt unable to maintain balance.  PT provided min-mod A for balance while performing modified SLS with R and LLE on step and performing 10 reps controlled head turns and 10 reps head nods.    Pt requires buoyancy of water for support for joint offloading for pain reduction in hip and low back, to reduce fall risk with gait training and balance exercises with minimal UE support; exercises able  to be performed safely in water without the risk of fall compared to those same exercises performed on land;  viscosity of water needed for resistance for strengthening.  Current of water provides perturbations for challenging static & dynamic standing balance.     PT Short Term Goals - 09/03/21 1148       PT SHORT TERM GOAL #1   Title = LTG               PT Long Term Goals - 09/20/21 1628       PT LONG TERM GOAL #1   Title Pt will demonstrate independence with final HEP, will return to walking program 2-3x/week with LRAD, and will return to chair yoga at [facility]    Time 4    Period Weeks    Status On-going    Target Date 10/13/21      PT LONG TERM GOAL #2   Title Pt will demonstrate ability to stand from floor without the use of UE MOD I    Time 4    Period Weeks    Status Revised    Target Date 10/13/21      PT LONG TERM GOAL #3   Title Pt will increase FGA score to >/= 22/30 to indicate decreased falls risk    Baseline 18/30    Time 4    Period Weeks    Status Revised    Target Date 10/13/21      PT LONG TERM GOAL #5   Title Pt will ambulate x 1000 over variety of surfaces, up/down curb, and across parking lot with no AD, independently and perform visual scanning for safety without evidence of imbalance    Baseline Met with trekking pole; will upgrade to independent without trekking pole    Time 4    Period Weeks    Status Revised    Target Date 10/13/21               Plan - 10/06/21 1439     Clinical Impression Statement Aquatic therapy session today attempted to progress dynamic standing balance training to 4 feet of water for increased pertubations but pt unable to achieve full step length with reciprocal UE swing.  Once pt back in more shallow region pt demonstrated improvement in ability to perform more dynamic balance challenges including fast walking and fast turns.  Pt continues to have increased difficulty with SLS activities and continues to  require increased UE support.  No dizziness reported during session.    Personal Factors and Comorbidities Comorbidity 2;Past/Current Experience;Social Background    Examination-Activity Limitations Bend;Locomotion Level;Stairs;Stand;Squat;Transfers    Examination-Participation Restrictions Cleaning;Community Activity;Yard Work    Rehab Potential Good    PT Frequency 2x / week    PT Duration 4 weeks  PT Treatment/Interventions ADLs/Self Care Home Management;Aquatic Therapy;Canalith Repostioning;DME Instruction;Gait training;Stair training;Functional mobility training;Therapeutic activities;Therapeutic exercise;Balance training;Neuromuscular re-education;Patient/family education;Vestibular    PT Next Visit Plan Aquatic- SLS activities, curb/step downs, EC, fast walking. LAND: Check LTG and recert for 1x/week, aquatic    Consulted and Agree with Plan of Care Patient             Patient will benefit from skilled therapeutic intervention in order to improve the following deficits and impairments:  Abnormal gait, Decreased balance, Difficulty walking, Impaired perceived functional ability, Other (comment) (disequilibrium)  Visit Diagnosis: Difficulty in walking, not elsewhere classified  Repeated falls  Unsteadiness on feet     Problem List Patient Active Problem List   Diagnosis Date Noted   History of atrial fibrillation 09/25/2021   Heart disease, unspecified 09/25/2021   Balance problems 05/21/2020   Rico Junker, PT, DPT 10/07/21    9:59 AM    [facility] [address] [place], [place], [address] Phone: [phone]   Fax:  [phone]  Name: Lisa Moore MRN: [id number] Date of Birth: July 16, 1951

## 2021-10-07 ENCOUNTER — Other Ambulatory Visit (HOSPITAL_BASED_OUTPATIENT_CLINIC_OR_DEPARTMENT_OTHER): Payer: Medicare Other

## 2021-10-08 ENCOUNTER — Encounter: Payer: Self-pay | Admitting: Physical Therapy

## 2021-10-08 ENCOUNTER — Ambulatory Visit: Payer: Medicare Other | Admitting: Physical Therapy

## 2021-10-08 ENCOUNTER — Other Ambulatory Visit: Payer: Self-pay

## 2021-10-08 DIAGNOSIS — R262 Difficulty in walking, not elsewhere classified: Secondary | ICD-10-CM | POA: Diagnosis not present

## 2021-10-08 DIAGNOSIS — R2681 Unsteadiness on feet: Secondary | ICD-10-CM | POA: Diagnosis not present

## 2021-10-08 DIAGNOSIS — R296 Repeated falls: Secondary | ICD-10-CM | POA: Diagnosis not present

## 2021-10-08 NOTE — Patient Instructions (Signed)
Feet Heel-Toe "Tandem"    Arms outstretched, walk a straight line bringing one foot directly in front of the other.  Go slowly and with each step forwards, turn your head to look left or right.   Repeat for 4 laps per session. Do __2__ sessions per day.  Walk forwards and Backwards: EYES CLOSED   Right hand on the counter - Walk forwards with eyes closed, slowly, keeping your balance.  At the end of the counter, walk backwards with eyes closed; right hand on the counter. Repeat 4 times.   Step-Up: Forward <> Backward on Pillow    Standing in the corner, pillow in front of you and chair for support. High Step forward on to the pillow with your right foot, followed with left.  Step back and down off the pillow.  Repeat 5 times leading with right leg. Switch and lead with the left leg, 5 times.    FUNCTIONAL MOBILITY: Marching in place with opposite arm touch - Standing    March in place by lifting left leg up, reach across with right hand and tap left knee. Put that foot down and switch, lift right leg up, reach across with left hand and tap right knee. Go slowly and alternate for 60 seconds.  Do 2 sets.  ____________________________________________________________________________________   Charyl Bigger, One Leg    Start with both feet on bed.  Squeeze your butt muscles, lift your hips. Keep the hips lifted while you lift one foot up.  Foot down, hips down. Repeat, lifting the other foot. Repeat __5__ times each leg.    Hip Abduction: Modified    Lying on right side, right knee and hip bent.  Top leg out straight. Lift the top leg up towards the ceiling (doesn't have to be too high), keeping the pelvis rolled forwards but keep the leg from drifting forwards.   Repeat __10__ times per side.   Hip Abduction: Side-Stepping: Monster Steps (Eccentric)    Band around thighs.  Keeping the feet forwards, sit in a small squat and perform side steps against the resistance  of the band to the right and then switch and step back to the left.  4 laps down and back, __2_ sets per day.

## 2021-10-08 NOTE — Therapy (Signed)
[facility] [address] [place], [place], [address] Phone: [phone]   Fax:  [phone]  Physical Therapy Treatment  Patient Details  Name: Lisa Moore MRN: [id number] Date of Birth: 05/19/1951 Referring Provider (PT): Blenda Nicely, Henderson Cloud, MD   Encounter Date: 10/08/2021   PT End of Session - 10/08/21 1648     Visit Number 25    Number of Visits 25    Date for PT Re-Evaluation 10/13/21    Authorization Type Medicare    Progress Note Due on Visit 25    PT Start Time 0847    PT Stop Time 0930    PT Time Calculation (min) 43 min    Activity Tolerance Patient tolerated treatment well    Behavior During Therapy [facility] for tasks assessed/performed             Past Medical History:  Diagnosis Date   Arthritis    Benign hematuria    GERD (gastroesophageal reflux disease)    Migraines    rare   Myocardial infarction ([place]) 2022   Ablation 2022   Persistent atrial fibrillation ([place]) 05/21/2020   Typical atrial flutter ([place]) 11/13/2020    Past Surgical History:  Procedure Laterality Date   ABDOMINAL HYSTERECTOMY     APPENDECTOMY     ATRIAL FIBRILLATION ABLATION N/A 01/13/2021   Procedure: ATRIAL FIBRILLATION ABLATION;  Surgeon: Thompson Grayer, MD;  Location: [place] [facility];  Service: Cardiovascular;  Laterality: N/A;   CARDIOVERSION N/A 07/07/2020   Procedure: CARDIOVERSION;  Surgeon: Josue Hector, MD;  Location: [facility] ENDOSCOPY;  Service: Cardiovascular;  Laterality: N/A;   CARDIOVERSION N/A 11/07/2020   Procedure: CARDIOVERSION;  Surgeon: Josue Hector, MD;  Location: [facility] ENDOSCOPY;  Service: Cardiovascular;  Laterality: N/A;    There were no vitals filed for this visit.   Subjective Assessment - 10/08/21 0848     Subjective No trekking pole today.  Pt reporting more frontal abdominal pain on R but not as much back pain.  Noticed when she was walking in her yard she has to be very careful with  turning and going to the side.    Pertinent History OA, Migraines, a-fib with cardioversion - pt elected to stop Eliquis    Limitations Standing;Walking;House hold activities    Diagnostic tests MRI in 2021: negative brain MRI for age, no acute abnormality    Patient Stated Goals To remain independent, prevent falls, be able to garden, to be able to get off the floor independently.    Currently in Pain? Yes    Pain Score 2     Pain Location Abdomen    Pain Orientation Right                [facility] PT Assessment - 10/08/21 0852       Assessment   Medical Diagnosis Balance Disorder    Referring Provider (PT) Gavin Pound, MD    Onset Date/Surgical Date 07/03/21    Prior Therapy none      Precautions   Precautions Other (comment);Fall    Precaution Comments OA, Migraines, a-fib with cardioversion - pt elected to stop Eliquis      Prior Function   Level of Independence Independent      Observation/Other Assessments   Focus on Therapeutic Outcomes (FOTO)  Not assessed      Transfers   Transfers Floor to Transfer    Floor to Transfer 6: Modified independent (Device/Increase time)    Floor  to Transfer Details (indicate cue type and reason) With UE support on mat performed stand > floor through half kneeling and supervision.  Returned to standing x 2 through half kneeling with R foot forwards and UE pushing from mat.  Reported pulling/tightness in L anterior hip and thigh.      Ambulation/Gait   Ambulation/Gait Yes    Ambulation/Gait Assistance 6: Modified independent (Device/Increase time);5: Supervision    Ambulation/Gait Assistance Details Returned to outdoor surfaces without trekking pole.  Performed gait across unlevel pavement sidewalk and parking lot changing gait speed, ambulating out in the open, up/down paved curb and grassy curb, stepping over parking bumper, and across grassy terrain, up/downhill.  Also performed gaze and head turns to L and R to attend to  environment.  Performed MOD I on paved surfaces but supervision when negotiating obstacles and compliant grassy surfaces.  Also performed short forwards and backwards across gravel with supervision due to slight veering.    Ambulation Distance (Feet) 1000 Feet    Assistive device None    Gait Pattern Step-through pattern;Decreased arm swing - right;Decreased arm swing - left    Ambulation Surface Unlevel;Outdoor;Paved;Gravel;Grass      Functional Gait  Assessment   Gait assessed  Yes    Gait Level Surface Walks 20 ft in less than 5.5 sec, no assistive devices, good speed, no evidence for imbalance, normal gait pattern, deviates no more than 6 in outside of the 12 in walkway width.    Change in Gait Speed Able to smoothly change walking speed without loss of balance or gait deviation. Deviate no more than 6 in outside of the 12 in walkway width.    Gait with Horizontal Head Turns Performs head turns smoothly with slight change in gait velocity (eg, minor disruption to smooth gait path), deviates 6-10 in outside 12 in walkway width, or uses an assistive device.    Gait with Vertical Head Turns Performs task with slight change in gait velocity (eg, minor disruption to smooth gait path), deviates 6 - 10 in outside 12 in walkway width or uses assistive device    Gait and Pivot Turn Pivot turns safely within 3 sec and stops quickly with no loss of balance.    Step Over Obstacle Is able to step over one shoe box (4.5 in total height) without changing gait speed. No evidence of imbalance.    Gait with Narrow Base of Support Ambulates less than 4 steps heel to toe or cannot perform without assistance.    Gait with Eyes Closed Walks 20 ft, slow speed, abnormal gait pattern, evidence for imbalance, deviates 10-15 in outside 12 in walkway width. Requires more than 9 sec to ambulate 20 ft.    Ambulating Backwards Walks 20 ft, uses assistive device, slower speed, mild gait deviations, deviates 6-10 in outside 12  in walkway width.    Steps Alternating feet, must use rail.    Total Score 20    FGA comment: 20/30              PT Education - 10/08/21 0930     Education Details Initiated hip flexor stretch but reported increased low back pain; attempted to review posterior pelvic tilts; progress towards goals and areas to continue to focus on with PT    Person(s) Educated Patient    Methods Explanation;Demonstration    Comprehension Need further instruction              PT Short Term Goals - 09/03/21 1148  PT SHORT TERM GOAL #1   Title = LTG               PT Long Term Goals - 10/08/21 1649       PT LONG TERM GOAL #1   Title Pt will demonstrate independence with final HEP, will return to walking program 2-3x/week with LRAD, and will return to chair yoga at [facility]    Time 4    Period Weeks    Status Partially Met    Target Date 10/13/21      PT LONG TERM GOAL #2   Title Pt will demonstrate ability to stand from floor without the use of UE MOD I    Time 4    Period Weeks    Status Not Met    Target Date 10/13/21      PT LONG TERM GOAL #3   Title Pt will increase FGA score to >/= 22/30 to indicate decreased falls risk    Baseline 18/30 > 20/30    Time 4    Period Weeks    Status Partially Met    Target Date 10/13/21      PT LONG TERM GOAL #5   Title Pt will ambulate x 1000 over variety of surfaces, up/down curb, and across parking lot with no AD, independently and perform visual scanning for safety without evidence of imbalance    Baseline MOD I except supervision for curbs, compliant surfaces    Time 4    Period Weeks    Status Partially Met    Target Date 10/13/21            New goals for recertification:  PT Long Term Goals - 10/08/21 1940       PT LONG TERM GOAL #1   Title Pt will demonstrate independence with final HEP, will return to walking program 2-3x/week with LRAD, and will return to chair yoga at [facility]    Time 5    Period Weeks    Status  Revised    Target Date 11/12/21      PT LONG TERM GOAL #2   Title Pt will demonstrate ability to stand from floor without the use of UE MOD I    Time 5    Period Weeks    Status Revised    Target Date 11/12/21      PT LONG TERM GOAL #3   Title Pt will increase FGA score to >/= 22/30 to indicate decreased falls risk    Baseline 18/30 > 20/30    Time 5    Period Weeks    Status Revised    Target Date 11/12/21      PT LONG TERM GOAL #5   Title Pt will ambulate x 1000 over variety of surfaces, up/down curb, and across parking lot with no AD, independently and perform visual scanning for safety without evidence of imbalance    Baseline MOD I except supervision for curbs, compliant surfaces    Time 5    Period Weeks    Status Revised    Target Date 11/12/21                Plan - 10/08/21 1650     Clinical Impression Statement Performed assessment of progress towards LTG.  Pt is making steady progress and has partially met 3/4 LTG but did not meet floor <> stand goal.  Pt demonstrates improvement in FGA but continues to demonstrate imbalance with dynamic challenges of horizontal and  vertical head movements, stepping over obstacles, stair negotiation, narrow BOS and when vision removed.  Pt demonstrates progress with gait in more challenging, open environments and has recently began to ambulate in the community without her trekking pole.  Pt continues to require supervision for more compliant surfaces and when negotiating curbs.  Pt is able to perform floor <> stand MOD I, but requires significant use of UE to perform.  Pt is performing HEP consistently but has not returned to community wellness classes.  Pt will benefit from continued skilled PT services to address ongoing impairments and to continue to progress towards unmet goals.    Personal Factors and Comorbidities Comorbidity 2;Past/Current Experience;Social Background    Examination-Activity Limitations Bend;Locomotion  Level;Stairs;Stand;Squat;Transfers    Examination-Participation Restrictions Cleaning;Community Activity;Yard Work    Rehab Potential Good    PT Frequency 1x / week    PT Duration 6 weeks    PT Treatment/Interventions ADLs/Self Care Home Management;Aquatic Therapy;Canalith Repostioning;DME Instruction;Gait training;Stair training;Functional mobility training;Therapeutic activities;Therapeutic exercise;Balance training;Neuromuscular re-education;Patient/family education;Vestibular    PT Next Visit Plan Stretch hip flexor; up from pool floor in shallow area; Aquatic- SLS activities, curb/step downs, EC, fast walking, turns, tandem    Consulted and Agree with Plan of Care Patient             Patient will benefit from skilled therapeutic intervention in order to improve the following deficits and impairments:  Abnormal gait, Decreased balance, Difficulty walking, Impaired perceived functional ability, Other (comment) (disequilibrium)  Visit Diagnosis: Difficulty in walking, not elsewhere classified  Repeated falls  Unsteadiness on feet     Problem List Patient Active Problem List   Diagnosis Date Noted   History of atrial fibrillation 09/25/2021   Heart disease, unspecified 09/25/2021   Balance problems 05/21/2020    Rico Junker, PT, DPT 10/08/21    7:39 PM    [facility] [address] [place], [place], [address] Phone: [phone]   Fax:  [phone]  Name: Lisa Moore MRN: [id number] Date of Birth: 01-17-1951

## 2021-10-12 ENCOUNTER — Ambulatory Visit (HOSPITAL_BASED_OUTPATIENT_CLINIC_OR_DEPARTMENT_OTHER)
Admission: RE | Admit: 2021-10-12 | Discharge: 2021-10-12 | Disposition: A | Discharge status: Home / Self Care | Payer: Medicare Other | Source: Ambulatory Visit | Attending: Nurse Practitioner | Admitting: Nurse Practitioner

## 2021-10-12 ENCOUNTER — Other Ambulatory Visit: Payer: Self-pay

## 2021-10-12 ENCOUNTER — Encounter (HOSPITAL_BASED_OUTPATIENT_CLINIC_OR_DEPARTMENT_OTHER): Payer: Self-pay | Admitting: Radiology

## 2021-10-12 ENCOUNTER — Other Ambulatory Visit (HOSPITAL_BASED_OUTPATIENT_CLINIC_OR_DEPARTMENT_OTHER): Payer: Self-pay | Admitting: Nurse Practitioner

## 2021-10-12 ENCOUNTER — Other Ambulatory Visit (HOSPITAL_BASED_OUTPATIENT_CLINIC_OR_DEPARTMENT_OTHER): Payer: Self-pay

## 2021-10-12 DIAGNOSIS — Z13820 Encounter for screening for osteoporosis: Secondary | ICD-10-CM | POA: Insufficient documentation

## 2021-10-12 DIAGNOSIS — Z1231 Encounter for screening mammogram for malignant neoplasm of breast: Secondary | ICD-10-CM

## 2021-10-12 DIAGNOSIS — Z780 Asymptomatic menopausal state: Secondary | ICD-10-CM | POA: Insufficient documentation

## 2021-10-12 DIAGNOSIS — M8589 Other specified disorders of bone density and structure, multiple sites: Secondary | ICD-10-CM | POA: Diagnosis not present

## 2021-10-13 ENCOUNTER — Ambulatory Visit: Payer: Medicare Other | Admitting: Physical Therapy

## 2021-10-13 DIAGNOSIS — R262 Difficulty in walking, not elsewhere classified: Secondary | ICD-10-CM | POA: Diagnosis not present

## 2021-10-13 DIAGNOSIS — R2681 Unsteadiness on feet: Secondary | ICD-10-CM | POA: Diagnosis not present

## 2021-10-13 DIAGNOSIS — R296 Repeated falls: Secondary | ICD-10-CM

## 2021-10-13 NOTE — Therapy (Signed)
[facility] [address] [place], [place], [address] Phone: [phone]   Fax:  [phone]  Physical Therapy Treatment  Patient Details  Name: Lisa Moore MRN: [id number] Date of Birth: [birth year] Referring Provider (PT): Gavin Pound, MD   Encounter Date: 10/13/2021   PT End of Session - 10/13/21 1700     Visit Number 26    Number of Visits 30    Date for PT Re-Evaluation 11/19/21    Authorization Type Medicare    Progress Note Due on Visit 3    PT Start Time 1020    PT Stop Time 1105    PT Time Calculation (min) 45 min    Activity Tolerance Patient tolerated treatment well    Behavior During Therapy [facility] for tasks assessed/performed             Past Medical History:  Diagnosis Date   Arthritis    Benign hematuria    GERD (gastroesophageal reflux disease)    Migraines    rare   Myocardial infarction ([place]) 2022   Ablation 2022   Persistent atrial fibrillation ([place]) 05/21/2020   Typical atrial flutter ([place]) 11/13/2020    Past Surgical History:  Procedure Laterality Date   ABDOMINAL HYSTERECTOMY     APPENDECTOMY     ATRIAL FIBRILLATION ABLATION N/A 01/13/2021   Procedure: ATRIAL FIBRILLATION ABLATION;  Surgeon: Thompson Grayer, MD;  Location: [facility];  Service: Cardiovascular;  Laterality: N/A;   CARDIOVERSION N/A 07/07/2020   Procedure: CARDIOVERSION;  Surgeon: Josue Hector, MD;  Location: [facility] ENDOSCOPY;  Service: Cardiovascular;  Laterality: N/A;   CARDIOVERSION N/A 11/07/2020   Procedure: CARDIOVERSION;  Surgeon: Josue Hector, MD;  Location: [facility] ENDOSCOPY;  Service: Cardiovascular;  Laterality: N/A;    There were no vitals filed for this visit.   Subjective Assessment - 10/13/21 1658     Subjective Still not using trekking pole today.    Pertinent History OA, Migraines, a-fib with cardioversion - pt elected to stop Eliquis    Limitations Standing;Walking;House hold  activities    Diagnostic tests MRI in 2021: negative brain MRI for age, no acute abnormality    Patient Stated Goals To remain independent, prevent falls, be able to garden, to be able to get off the floor independently.    Currently in Pain? Yes             Patient seen for aquatic therapy today.  Treatment took place in water 3-4 feet deep depending upon activity.  Pt entered and exited the pool via stairs, alternating sequence with light finger tip touch on rail.    From shallow end <> 4 feet, Continued to focus on gait sequence increasing step length and reciprocal UE swing; attempted to incorporate fast walking again to continue to facilitate step length and UE swing.  Pt tends to keep UE on top of water.  Cued pt to lower bilat UE into water and practice full reciprocal arm swing with increased extension pt noted to be losing balance forwards.    Performed standing balance hip strategy training at wall with bumps back to wall and then pulling hips forwards over BOS holding noodle with UE and using UE flexion<>extension to assist with weight shift.  Pt verbalized anxiety with leaning and weight shifting trunk forwards as hips shifted back.  Pt also demonstrated increased difficulty with utilizing hip extension to shift COG forwards over BOS.  PT provided verbal, visual and tactile cues  to facilitate weight shift; pt required mod A to prevent posterior LOB.  Pt noted to have decreased closed chain ankle DF.    Moved to wall and performed bilat gastroc and soleus stretches to improve ankle DF ROM.  Returned to standing at wall with noodle under one knee to keep LE elevated while performing stance LE closed chain hip ER<>IR.  When standing on LLE pt had increased difficulty keeping COG over stance LE and demonstrated increased use of back extensors.  PT assisted with maintaining alignment while pt performed rotation.  Pt better able to maintain alignment on RLE and perform rotation from hip.    Finished with walking short distance with 180 turns focusing on making turn in 3 large steps and continue walking forwards; pt able to perform when turning to L but not when turning to R.     Pt requires buoyancy of water for support for joint offloading for pain reduction in hip and low back, to reduce fall risk with gait training and balance exercises with minimal UE support; exercises able to be performed safely in water without the risk of fall compared to those same exercises performed on land;  viscosity of water needed for resistance for strengthening.  Current of water provides perturbations for challenging static & dynamic standing balance.     PT Short Term Goals - 09/03/21 1148       PT SHORT TERM GOAL #1   Title = LTG               PT Long Term Goals - 10/08/21 1940       PT LONG TERM GOAL #1   Title Pt will demonstrate independence with final HEP, will return to walking program 2-3x/week with LRAD, and will return to chair yoga at [facility]    Time 5    Period Weeks    Status Revised    Target Date 11/12/21      PT LONG TERM GOAL #2   Title Pt will demonstrate ability to stand from floor without the use of UE MOD I    Time 5    Period Weeks    Status Revised    Target Date 11/12/21      PT LONG TERM GOAL #3   Title Pt will increase FGA score to >/= 22/30 to indicate decreased falls risk    Baseline 18/30 > 20/30    Time 5    Period Weeks    Status Revised    Target Date 11/12/21      PT LONG TERM GOAL #5   Title Pt will ambulate x 1000 over variety of surfaces, up/down curb, and across parking lot with no AD, independently and perform visual scanning for safety without evidence of imbalance    Baseline MOD I except supervision for curbs, compliant surfaces    Time 5    Period Weeks    Status Revised    Target Date 11/12/21                   Plan - 10/13/21 1700     Clinical Impression Statement Aquatic environment utilized to address COG  position during static standing and balance reaction training.  Pt tends to keep COG more forwards and over utilizes back extensors to maintain upright posture.  Also continued to focus on strengthening closed chain hip rotation.  Will continue to address and progress towards LTG.    Personal Factors and Comorbidities Comorbidity 2;Past/Current Experience;Social Background  Examination-Activity Limitations Bend;Locomotion Level;Stairs;Stand;Squat;Transfers    Examination-Participation Restrictions Cleaning;Community Activity;Yard Work    Rehab Potential Good    PT Frequency 1x / week    PT Duration 6 weeks    PT Treatment/Interventions ADLs/Self Care Home Management;Aquatic Therapy;Canalith Repostioning;DME Instruction;Gait training;Stair training;Functional mobility training;Therapeutic activities;Therapeutic exercise;Balance training;Neuromuscular re-education;Patient/family education;Vestibular    PT Next Visit Plan Stretch hip flexor and gastroc; up from pool floor in shallow area, curb/step downs, EC, fast walking, turning and closed chain hip rotation, tandem    Consulted and Agree with Plan of Care Patient             Patient will benefit from skilled therapeutic intervention in order to improve the following deficits and impairments:  Abnormal gait, Decreased balance, Difficulty walking, Impaired perceived functional ability, Other (comment) (disequilibrium)  Visit Diagnosis: Difficulty in walking, not elsewhere classified  Unsteadiness on feet  Repeated falls     Problem List Patient Active Problem List   Diagnosis Date Noted   History of atrial fibrillation 09/25/2021   Heart disease, unspecified 09/25/2021   Balance problems 05/21/2020    Rico Junker, PT, DPT 10/13/21    5:51 PM   [facility] [address] [place], [place], [address] Phone: [phone]   Fax:  [phone]  Name: Siddhi Mcconnico  [place] MRN: [id number] Date of Birth: October 10, 1950

## 2021-10-19 ENCOUNTER — Ambulatory Visit: Payer: Medicare Other | Admitting: Internal Medicine

## 2021-10-20 ENCOUNTER — Ambulatory Visit: Payer: Medicare Other | Attending: Otolaryngology | Admitting: Physical Therapy

## 2021-10-20 ENCOUNTER — Other Ambulatory Visit: Payer: Self-pay

## 2021-10-20 ENCOUNTER — Ambulatory Visit (INDEPENDENT_AMBULATORY_CARE_PROVIDER_SITE_OTHER): Payer: Medicare Other | Admitting: Cardiology

## 2021-10-20 ENCOUNTER — Encounter: Payer: Self-pay | Admitting: Cardiology

## 2021-10-20 VITALS — BP 124/76 | HR 76 | Ht 65.0 in | Wt 197.8 lb

## 2021-10-20 DIAGNOSIS — R2681 Unsteadiness on feet: Secondary | ICD-10-CM

## 2021-10-20 DIAGNOSIS — I4819 Other persistent atrial fibrillation: Secondary | ICD-10-CM

## 2021-10-20 DIAGNOSIS — I484 Atypical atrial flutter: Secondary | ICD-10-CM

## 2021-10-20 DIAGNOSIS — R296 Repeated falls: Secondary | ICD-10-CM | POA: Diagnosis not present

## 2021-10-20 DIAGNOSIS — R262 Difficulty in walking, not elsewhere classified: Secondary | ICD-10-CM | POA: Diagnosis not present

## 2021-10-20 NOTE — Progress Notes (Signed)
Electrophysiology Office Follow up Visit Note:    Date:  10/20/2021   ID:  Lisa Moore, DOB [birth year], MRN [id number]  PCP:  Orma Render, NP  [facility] Cardiologist:  Kirk Ruths, MD  [facility] Electrophysiologist:  Vickie Epley, MD    Interval History:    Lisa Moore is a 71 y.o. female who presents for a follow up visit. Previous Dr. Rayann Heman patient. They were last seen in clinic April 13, 2021.  Had a PVI and CTI ablation.  Her CHA2DS2-VASc is 2.  Has stopped anticoagulation after discussing with Dr. Rayann Heman.  Since her last appointment, she denies any recurrent episodes of atrial fibrillation. She monitors this with her Kardia occasionally. At home she does not monitor her blood pressure.  She denies any palpitations, chest pain, or shortness of breath. No lightheadedness, headaches, syncope, orthopnea, PND, lower extremity edema or exertional symptoms.       Past Medical History:  Diagnosis Date   Arthritis    Benign hematuria    GERD (gastroesophageal reflux disease)    Migraines    rare   Myocardial infarction Lisa Moore) 2022   Ablation 2022   Persistent atrial fibrillation ([place]) 05/21/2020   Typical atrial flutter ([place]) 11/13/2020    Past Surgical History:  Procedure Laterality Date   ABDOMINAL HYSTERECTOMY     APPENDECTOMY     ATRIAL FIBRILLATION ABLATION N/A 01/13/2021   Procedure: ATRIAL FIBRILLATION ABLATION;  Surgeon: Thompson Grayer, MD;  Location: [facility] LAB;  Service: Cardiovascular;  Laterality: N/A;   CARDIOVERSION N/A 07/07/2020   Procedure: CARDIOVERSION;  Surgeon: Josue Hector, MD;  Location: [facility] ENDOSCOPY;  Service: Cardiovascular;  Laterality: N/A;   CARDIOVERSION N/A 11/07/2020   Procedure: CARDIOVERSION;  Surgeon: Josue Hector, MD;  Location: [facility] ENDOSCOPY;  Service: Cardiovascular;  Laterality: N/A;    Current Medications: Current Meds  Medication Sig   acetaminophen (TYLENOL) 500 MG tablet Take 1,000 mg by mouth  every 6 (six) hours as needed for moderate pain or headache.   augmented betamethasone dipropionate (DIPROLENE-AF) 0.05 % cream Apply topically.   diltiazem (CARDIZEM CD) 120 MG 24 hr capsule Take 1 capsule (120 mg total) by mouth daily.   Tdap (BOOSTRIX) 5-2.5-18.5 LF-MCG/0.5 injection Inject into the muscle.   Zoster Vaccine Adjuvanted [facility]) injection Inject into the muscle.     Allergies:   Patient has no known allergies.   Social History   Socioeconomic History   Marital status: Widowed    Spouse name: Not on file   Number of children: Not on file   Years of education: Not on file   Highest education level: Not on file  Occupational History   Not on file  Tobacco Use   Smoking status: Never   Smokeless tobacco: Never  Vaping Use   Vaping Use: Never used  Substance and Sexual Activity   Alcohol use: Yes    Alcohol/week: 1.0 standard drink    Types: 1 Shots of liquor per week    Comment: occasional beer few times a month, not regularly   Drug use: No   Sexual activity: Not Currently  Other Topics Concern   Not on file  Social History Narrative   Not on file   Social Determinants of Health   Financial Resource Strain: Not on file  Food Insecurity: Not on file  Transportation Needs: Not on file  Physical Activity: Not on file  Stress: Not on file  Social Connections: Not on file  Family History: The patient's family history includes COPD in her father; Cancer in her father, paternal grandmother, and sister; Diabetes in her father; Hypertension in her mother; Kidney disease in her maternal grandmother.  ROS:   Please see the history of present illness.    All other systems reviewed and are negative.  EKGs/Labs/Other Studies Reviewed:    The following studies were reviewed today:  01/13/2021 Atrial Fibrillation Ablation: CONCLUSIONS: 1. Atrial fibrillation upon presentation.   2. Intracardiac echo reveals a mildly enlarged sized left atrium. 3.  Successful electrical isolation and anatomical encircling of all four pulmonary veins with radiofrequency current. 4. Additional mapping and ablation within the left atrium due to persistence of atrial fibrillation with a posterior wall box demonstrated  5. Atrial fibrillation successfully cardioverted to sinus rhythm. 6. Cavo-tricuspid isthmus ablation performed with complete bidirectional isthmus block achieved 7. No inducible arrhythmias following ablation 8. No early apparent complications.   Additional instructions: The patient takes eliquis 5mg  BID for stroke prevention.  We will resume eliquis post procedure today.  The patient should not interrupt oral anticoagulation for any reason for 3 months post ablation.  01/08/2021 Cardiac CTA: IMPRESSION: 1. There is normal pulmonary vein drainage into the left atrium with ostial measurements above.   2. Moderate LAE; there is no thrombus in the left atrial appendage.   3. The esophagus runs in close proximity to the left upper and left lower pulmonary vein ostia.   4. Probable PFO.   5. Normal coronary origin. Right dominance.   6. CAC score of 0 which is 0 percentile for age-, race-, and sex-matched controls.   7. Mildly dilated aortic root.  05/21/2020 Echo:  1. Left ventricular ejection fraction, by estimation, is 55 to 60%. The  left ventricle has normal function. The left ventricle has no regional  wall motion abnormalities. Left ventricular diastolic parameters are  indeterminate.   2. Right ventricular systolic function is normal. The right ventricular  size is normal.   3. Left atrial size was moderately dilated.   4. The mitral valve is degenerative. Mild mitral valve regurgitation. No  evidence of mitral stenosis. Moderate mitral annular calcification.   5. The aortic valve is tricuspid. Aortic valve regurgitation is not  visualized. No aortic stenosis is present.   6. The inferior vena cava is normal in size with  greater than 50%  respiratory variability, suggesting right atrial pressure of 3 mmHg.   EKG:    Sinus rhythm.  Normal intervals.   Recent Labs: 09/29/2021: ALT 10; BUN 17; Creatinine, Ser 0.90; Hemoglobin 14.9; Platelets 352; Potassium 4.2; Sodium 139   Recent Lipid Panel    Component Value Date/Time   CHOL 233 (H) 09/29/2021 0948   TRIG 105 09/29/2021 0948   HDL 71 09/29/2021 0948   CHOLHDL 3.3 09/29/2021 0948   CHOLHDL 4 03/31/2018 0914   VLDL 20.8 03/31/2018 0914   LDLCALC 144 (H) 09/29/2021 0948    Physical Exam:    VS:  BP 124/76    Pulse 76    Ht 5\' 5"  (1.651 m)    Wt 197 lb 12.8 oz (89.7 kg)    LMP  (LMP Unknown)    SpO2 96%    BMI 32.92 kg/m     Wt Readings from Last 3 Encounters:  10/20/21 197 lb 12.8 oz (89.7 kg)  09/25/21 195 lb (88.5 kg)  07/29/21 194 lb 6.4 oz (88.2 kg)     GEN: Well nourished, well developed in no acute  distress HEENT: Normal NECK: No JVD; No carotid bruits LYMPHATICS: No lymphadenopathy CARDIAC: RRR, no murmurs, rubs, gallops RESPIRATORY:  Clear to auscultation without rales, wheezing or rhonchi  ABDOMEN: Soft, non-tender, non-distended MUSCULOSKELETAL:  No edema; No deformity  SKIN: Warm and dry NEUROLOGIC:  Alert and oriented x 3 PSYCHIATRIC:  Normal affect        ASSESSMENT:    1. Persistent atrial fibrillation ([place])   2. Atypical atrial flutter ([facility])    PLAN:    In order of problems listed above:  #Persistent atrial fibrillation Maintaining sinus rhythm after an ablation.  She is now off of her blood thinner.  She periodically monitors her rhythm at home using a cardia device.  She would like to stop her diltiazem which I think is reasonable.  She will continue to check her heart rhythm periodically and let us know if she were to have breakthrough episodes of atrial fibrillation.  We will plan to see her back in clinic in 1 year or sooner as needed.   Follow-up 1 year with APP.     Medication Adjustments/Labs and  Tests Ordered: Current medicines are reviewed at length with the patient today.  Concerns regarding medicines are outlined above.  No orders of the defined types were placed in this encounter.  No orders of the defined types were placed in this encounter.  I,Mathew Stumpf,acting as a Education administrator for Vickie Epley, MD.,have documented all relevant documentation on the behalf of Vickie Epley, MD,as directed by  Vickie Epley, MD while in the presence of Vickie Epley, MD.  I, Vickie Epley, MD, have reviewed all documentation for this visit. The documentation on 10/20/21 for the exam, diagnosis, procedures, and orders are all accurate and complete.   Signed, Lars Mage, MD, [facility], [facility] 10/20/2021 11:03 AM    Electrophysiology [facility]

## 2021-10-20 NOTE — Patient Instructions (Signed)

## 2021-10-20 NOTE — Progress Notes (Deleted)
Previous Dr. Rayann Heman patient.  Last saw April 13, 2021.  Had a PVI and CTI ablation.  Her CHA2DS2-VASc is 2.  Has stopped anticoagulation after discussing with Dr. Rayann Heman.

## 2021-10-21 NOTE — Therapy (Signed)
[facility] [address] [place], [place], [address] Phone: [phone]   Fax:  [phone]  Physical Therapy Treatment  Patient Details  Name: Lisa Moore MRN: [id number] Date of Birth: 09-28-1950 Referring Provider (PT): Gavin Pound, MD   Encounter Date: 10/20/2021   PT End of Session - 10/21/21 1133     Visit Number 27    Number of Visits 30    Date for PT Re-Evaluation 11/19/21    Authorization Type Medicare    Progress Note Due on Visit 47    PT Start Time 1150    PT Stop Time 1230    PT Time Calculation (min) 40 min    Activity Tolerance Patient tolerated treatment well    Behavior During Therapy [facility] for tasks assessed/performed             Past Medical History:  Diagnosis Date   Arthritis    Benign hematuria    GERD (gastroesophageal reflux disease)    Migraines    rare   Myocardial infarction ([place]) 2022   Ablation 2022   Persistent atrial fibrillation ([place]) 05/21/2020   Typical atrial flutter ([place]) 11/13/2020    Past Surgical History:  Procedure Laterality Date   ABDOMINAL HYSTERECTOMY     APPENDECTOMY     ATRIAL FIBRILLATION ABLATION N/A 01/13/2021   Procedure: ATRIAL FIBRILLATION ABLATION;  Surgeon: Thompson Grayer, MD;  Location: [facility] CV LAB;  Service: Cardiovascular;  Laterality: N/A;   CARDIOVERSION N/A 07/07/2020   Procedure: CARDIOVERSION;  Surgeon: Josue Hector, MD;  Location: [facility] ENDOSCOPY;  Service: Cardiovascular;  Laterality: N/A;   CARDIOVERSION N/A 11/07/2020   Procedure: CARDIOVERSION;  Surgeon: Josue Hector, MD;  Location: [facility] ENDOSCOPY;  Service: Cardiovascular;  Laterality: N/A;    There were no vitals filed for this visit.   Subjective Assessment - 10/21/21 1113     Subjective Doing well today.  Had a good appointment with cardiologist.    Pertinent History OA, Migraines, a-fib with cardioversion - pt elected to stop Eliquis    Limitations  Standing;Walking;House hold activities    Diagnostic tests MRI in 2021: negative brain MRI for age, no acute abnormality    Patient Stated Goals To remain independent, prevent falls, be able to garden, to be able to get off the floor independently.    Currently in Pain? No/denies             Patient seen for aquatic therapy today.  Treatment took place in water 3-4 feet deep depending upon activity.  Pt entered and exited the pool via stairs with rails; decreased hesitation with descending stairs.  Warm up walking forwards across width of pool focusing on increased step and stride length and reciprocal UE swing.  Also focused on increasing gait speed to fast forward walking to facilitate increased trunk rotation, UE swing and step length.  Also focused on changing between fast and slow gait speed and sudden stops without UE support and without LOB.    Returned to focusing on glute med strengthening in SLS while performing open chain and then closed chain hip IR by rotating floating LE across midline.  Performed on R and L side with bilat UE support > one UE support.  Continued to require min-mod A from PT to maintain COG over stance LE due to ongoing tendency to shift COG forwards and laterally.  Performed gait with 180 turns to L and R focusing on making turn and continuing gait  momentum in 3-4 larger steps without UE support.    Continued to focus on balance reactions and ankle/hip strategy training with anterior/posterior wall bumps x 10 reps with EO, x 10 reps with EC.  Stood with narrow BOS with EC while performing alternating, dynamic UE movements: 2 sets x 10 reps push/pull, ABD<>ADD, horizontal ABD<>ADD.  Pt able to perform without LOB.  Pt requires buoyancy of water for support for joint offloading for pain reduction in low back, to reduce fall risk with gait training and balance exercises with minimal UE support; exercises able to be performed safely in water without the risk of fall  compared to those same exercises performed on land;  viscosity of water needed for resistance for strengthening.  Current of water provides perturbations for challenging static & dynamic standing balance.    PT Short Term Goals - 09/03/21 1148       PT SHORT TERM GOAL #1   Title = LTG               PT Long Term Goals - 10/08/21 1940       PT LONG TERM GOAL #1   Title Pt will demonstrate independence with final HEP, will return to walking program 2-3x/week with LRAD, and will return to chair yoga at [facility]    Time 5    Period Weeks    Status Revised    Target Date 11/12/21      PT LONG TERM GOAL #2   Title Pt will demonstrate ability to stand from floor without the use of UE MOD I    Time 5    Period Weeks    Status Revised    Target Date 11/12/21      PT LONG TERM GOAL #3   Title Pt will increase FGA score to >/= 22/30 to indicate decreased falls risk    Baseline 18/30 > 20/30    Time 5    Period Weeks    Status Revised    Target Date 11/12/21      PT LONG TERM GOAL #5   Title Pt will ambulate x 1000 over variety of surfaces, up/down curb, and across parking lot with no AD, independently and perform visual scanning for safety without evidence of imbalance    Baseline MOD I except supervision for curbs, compliant surfaces    Time 5    Period Weeks    Status Revised    Target Date 11/12/21               Plan - 10/21/21 1133     Clinical Impression Statement Aquatic session continued to focus on glute med strengthening to improve safety and stability with hip rotation and pivoting during gait.  Also continued to address balance and balance reaction training with vision removed.  Pt is now able to ambulate in aquatic environment without any support and perform more dynamic challenges without UE support.    Personal Factors and Comorbidities Comorbidity 2;Past/Current Experience;Social Background    Examination-Activity Limitations Bend;Locomotion  Level;Stairs;Stand;Squat;Transfers    Examination-Participation Restrictions Cleaning;Community Activity;Yard Work    Rehab Potential Good    PT Frequency 1x / week    PT Duration 6 weeks    PT Treatment/Interventions ADLs/Self Care Home Management;Aquatic Therapy;Canalith Repostioning;DME Instruction;Gait training;Stair training;Functional mobility training;Therapeutic activities;Therapeutic exercise;Balance training;Neuromuscular re-education;Patient/family education;Vestibular    PT Next Visit Plan Stretch hip flexor and gastroc; up from pool floor in shallow area, curb/step downs, EC, fast walking, turning and closed chain hip  rotation, tandem    Consulted and Agree with Plan of Care Patient             Patient will benefit from skilled therapeutic intervention in order to improve the following deficits and impairments:  Abnormal gait, Decreased balance, Difficulty walking, Impaired perceived functional ability, Other (comment) (disequilibrium)  Visit Diagnosis: Difficulty in walking, not elsewhere classified  Unsteadiness on feet  Repeated falls     Problem List Patient Active Problem List   Diagnosis Date Noted   History of atrial fibrillation 09/25/2021   Heart disease, unspecified 09/25/2021   Balance problems 05/21/2020    Rico Junker, PT, DPT 10/21/21    11:36 AM    [facility] [address] [place], [place], [address] Phone: [phone]   Fax:  [phone]  Name: Lisa Moore MRN: [id number] Date of Birth: 1951-03-08

## 2021-10-27 ENCOUNTER — Ambulatory Visit: Payer: Medicare Other | Admitting: Physical Therapy

## 2021-10-27 ENCOUNTER — Other Ambulatory Visit: Payer: Self-pay

## 2021-10-27 DIAGNOSIS — R262 Difficulty in walking, not elsewhere classified: Secondary | ICD-10-CM | POA: Diagnosis not present

## 2021-10-27 DIAGNOSIS — R296 Repeated falls: Secondary | ICD-10-CM

## 2021-10-27 DIAGNOSIS — R2681 Unsteadiness on feet: Secondary | ICD-10-CM

## 2021-10-28 NOTE — Therapy (Signed)
[facility] [address] [place], [place], [address] Phone: [phone]   Fax:  [phone]  Physical Therapy Treatment  Patient Details  Name: Lisa Moore MRN: [id number] Date of Birth: 23-Mar-1951 Referring Provider (PT): Blenda Nicely, Henderson Cloud, MD   Encounter Date: 10/27/2021   PT End of Session - 10/28/21 0817     Visit Number 28    Number of Visits 30    Date for PT Re-Evaluation 11/19/21    Authorization Type Medicare    Progress Note Due on Visit 42    PT Start Time 1108    PT Stop Time 1146    PT Time Calculation (min) 38 min    Activity Tolerance Patient tolerated treatment well    Behavior During Therapy [facility] for tasks assessed/performed             Past Medical History:  Diagnosis Date   Arthritis    Benign hematuria    GERD (gastroesophageal reflux disease)    Migraines    rare   Myocardial infarction ([place]) 2022   Ablation 2022   Persistent atrial fibrillation ([place]) 05/21/2020   Typical atrial flutter ([place]) 11/13/2020    Past Surgical History:  Procedure Laterality Date   ABDOMINAL HYSTERECTOMY     APPENDECTOMY     ATRIAL FIBRILLATION ABLATION N/A 01/13/2021   Procedure: ATRIAL FIBRILLATION ABLATION;  Surgeon: Thompson Grayer, MD;  Location: [facility];  Service: Cardiovascular;  Laterality: N/A;   CARDIOVERSION N/A 07/07/2020   Procedure: CARDIOVERSION;  Surgeon: Josue Hector, MD;  Location: [facility] ENDOSCOPY;  Service: Cardiovascular;  Laterality: N/A;   CARDIOVERSION N/A 11/07/2020   Procedure: CARDIOVERSION;  Surgeon: Josue Hector, MD;  Location: [facility] ENDOSCOPY;  Service: Cardiovascular;  Laterality: N/A;    There were no vitals filed for this visit.   Subjective Assessment - 10/28/21 0809     Subjective Went to chair yoga and reports that some of the poses were very challenging for her low back.  Is looking into some pool classes at the [facility] [employer] and Aqua exercise  for Arthritis.  Asking for PT's recommendation.    Pertinent History OA, Migraines, a-fib with cardioversion - pt elected to stop Eliquis    Limitations Standing;Walking;House hold activities    Diagnostic tests MRI in 2021: negative brain MRI for age, no acute abnormality    Patient Stated Goals To remain independent, prevent falls, be able to garden, to be able to get off the floor independently.    Currently in Pain? Yes              Patient seen for aquatic therapy today.  Treatment took place in water 3-4 feet deep depending upon activity.  Pt entered and exited the pool via stairs, alternating sequence with light UE support on rails.  Initiated gait training with fast forward ambulation across width of 3.6 ft x 6 reps with sudden stops before pivoting progressing to making 180 pivot and continuing to ambulate forwards.  Also performed fast ambulation forwards with transition to retro gait with min A required for balance during retro gait.  Performed laps from 3.4 ft <> 61ft with varied gait tasks: changes in gait speed from fast <> slow, sudden stops, 90 deg turns to L and R, 180 turns, retro gait, side stepping, and vertical and horizontal head turns.  Utilized lower step and rails to perform high runners lunge for hip flexor and gastroc stretch <> propping heel  on first step and leaning back with knee straight for hamstring stretch on each side.    Performed standing > tall kneeling on second step with UE support on rails; performed tall kneeling squats x 10 reps and then focused on maintaining upright posture in tall kneeling while releasing UE support.  Performed tall kneeling > stand with rails and min A.  Transitioned to tall kneeling on first step and back to standing with min A and decreased UE support.  Pt requires buoyancy of water for support for joint offloading for pain reduction in low back and hip, to reduce fall risk with gait training and balance exercises with minimal  UE support; exercises able to be performed safely in water without the risk of fall compared to those same exercises performed on land;  viscosity of water needed for resistance for strengthening.  Current of water provides perturbations for challenging static & dynamic standing balance.    PT Education - 10/28/21 [phone]     Education Details Encouraged pt to utilize chair to modify yoga poses to what her body is able to perform right now; discussed observing an aquatic exercise class first and then determine if she would feel comfortable performing the exercises without one on one support    Person(s) Educated Patient    Methods Explanation    Comprehension Verbalized understanding              PT Short Term Goals - 09/03/21 1148       PT SHORT TERM GOAL #1   Title = LTG               PT Long Term Goals - 10/08/21 1940       PT LONG TERM GOAL #1   Title Pt will demonstrate independence with final HEP, will return to walking program 2-3x/week with LRAD, and will return to chair yoga at [facility]    Time 5    Period Weeks    Status Revised    Target Date 11/12/21      PT LONG TERM GOAL #2   Title Pt will demonstrate ability to stand from floor without the use of UE MOD I    Time 5    Period Weeks    Status Revised    Target Date 11/12/21      PT LONG TERM GOAL #3   Title Pt will increase FGA score to >/= 22/30 to indicate decreased falls risk    Baseline 18/30 > 20/30    Time 5    Period Weeks    Status Revised    Target Date 11/12/21      PT LONG TERM GOAL #5   Title Pt will ambulate x 1000 over variety of surfaces, up/down curb, and across parking lot with no AD, independently and perform visual scanning for safety without evidence of imbalance    Baseline MOD I except supervision for curbs, compliant surfaces    Time 5    Period Weeks    Status Revised    Target Date 11/12/21               Plan - 10/28/21 0818     Clinical Impression Statement Pt no  longer requires UE support or external support for ambulation forwards and laterally in aquatic environment or when performing turns due to improvements in LE strength and multi-sensory organization for balance.  Continues to require intermittent min A when ambulating backwards due to tendency for LOB posteriorly.  Due to improved balance in aquatic environment pt is considering beginning a group aquatic class for ongoing wellness, balance, endurance, and strength.    Personal Factors and Comorbidities Comorbidity 2;Past/Current Experience;Social Background    Examination-Activity Limitations Bend;Locomotion Level;Stairs;Stand;Squat;Transfers    Examination-Participation Restrictions Cleaning;Community Activity;Yard Work    Rehab Potential Good    PT Frequency 1x / week    PT Duration 6 weeks    PT Treatment/Interventions ADLs/Self Care Home Management;Aquatic Therapy;Canalith Repostioning;DME Instruction;Gait training;Stair training;Functional mobility training;Therapeutic activities;Therapeutic exercise;Balance training;Neuromuscular re-education;Patient/family education;Vestibular    PT Next Visit Plan Figure out how to stretch hip flexor without back pain and gastroc; up from bottom step in shallow area, curb/step downs, EC, fast walking, turning and closed chain hip rotation, tandem    Consulted and Agree with Plan of Care Patient             Patient will benefit from skilled therapeutic intervention in order to improve the following deficits and impairments:  Abnormal gait, Decreased balance, Difficulty walking, Impaired perceived functional ability, Other (comment) (disequilibrium)  Visit Diagnosis: Difficulty in walking, not elsewhere classified  Unsteadiness on feet  Repeated falls     Problem List Patient Active Problem List   Diagnosis Date Noted   History of atrial fibrillation 09/25/2021   Heart disease, unspecified 09/25/2021   Balance problems 05/21/2020    Rico Junker, PT, DPT 10/28/21    8:23 AM    [facility] [address] [place], [place], [address] Phone: [phone]   Fax:  [phone]  Name: Lisa Moore MRN: [id number] Date of Birth: 02/21/1951

## 2021-11-03 ENCOUNTER — Ambulatory Visit: Payer: Medicare Other | Admitting: Physical Therapy

## 2021-11-03 ENCOUNTER — Other Ambulatory Visit: Payer: Self-pay

## 2021-11-03 DIAGNOSIS — R262 Difficulty in walking, not elsewhere classified: Secondary | ICD-10-CM | POA: Diagnosis not present

## 2021-11-03 DIAGNOSIS — R296 Repeated falls: Secondary | ICD-10-CM

## 2021-11-03 DIAGNOSIS — R2681 Unsteadiness on feet: Secondary | ICD-10-CM | POA: Diagnosis not present

## 2021-11-03 NOTE — Therapy (Signed)
[facility] [address] [place], [place], [address] Phone: [phone]   Fax:  [phone]  Physical Therapy Treatment  Patient Details  Name: Lisa Moore MRN: [id number] Date of Birth: 1951/02/08 Referring Provider (PT): Gavin Pound, MD   Encounter Date: 11/03/2021   PT End of Session - 11/03/21 1506     Visit Number 29    Number of Visits 30    Date for PT Re-Evaluation 11/19/21    Authorization Type Medicare    Progress Note Due on Visit 36    PT Start Time 1103    PT Stop Time 1148    PT Time Calculation (min) 45 min    Activity Tolerance Patient tolerated treatment well    Behavior During Therapy [facility] for tasks assessed/performed             Past Medical History:  Diagnosis Date   Arthritis    Benign hematuria    GERD (gastroesophageal reflux disease)    Migraines    rare   Myocardial infarction ([place]) 2022   Ablation 2022   Persistent atrial fibrillation ([place]) 05/21/2020   Typical atrial flutter ([place]) 11/13/2020    Past Surgical History:  Procedure Laterality Date   ABDOMINAL HYSTERECTOMY     APPENDECTOMY     ATRIAL FIBRILLATION ABLATION N/A 01/13/2021   Procedure: ATRIAL FIBRILLATION ABLATION;  Surgeon: Thompson Grayer, MD;  Location: [facility];  Service: Cardiovascular;  Laterality: N/A;   CARDIOVERSION N/A 07/07/2020   Procedure: CARDIOVERSION;  Surgeon: Josue Hector, MD;  Location: [facility] ENDOSCOPY;  Service: Cardiovascular;  Laterality: N/A;   CARDIOVERSION N/A 11/07/2020   Procedure: CARDIOVERSION;  Surgeon: Josue Hector, MD;  Location: [facility] ENDOSCOPY;  Service: Cardiovascular;  Laterality: N/A;    There were no vitals filed for this visit.   Subjective Assessment - 11/03/21 1505     Subjective Tried yoga again but still having significant back pain; audited some of the aquatic classes-would be able do the Aqua Walking class but the other classes include floating in  supine and pt doesn't feel she would be able to do that class.  Next week is final aquatic PT session.    Pertinent History OA, Migraines, a-fib with cardioversion - pt elected to stop Eliquis    Limitations Standing;Walking;House hold activities    Diagnostic tests MRI in 2021: negative brain MRI for age, no acute abnormality    Patient Stated Goals To remain independent, prevent falls, be able to garden, to be able to get off the floor independently.    Currently in Pain? Yes    Pain Score 3     Pain Location Back    Pain Orientation Lower    Pain Descriptors / Indicators Discomfort            Patient seen for aquatic therapy today.  Treatment took place in water 3-4 feet deep depending upon activity.  Pt entered and exited the pool via stairs, alternating sequence with very light UE support on rails.  Holding longer barbell for balance/stability focused gait training on walking backwards x 4 reps across width of pool with pt demonstrating improved bilat step length and posterior weight shift.  Transitioned to holding long barbell on each side while performing tandem gait across width of pool x 4 reps with PT assisting with balance to prevent L lateral LOB.  Transitioned to step submerged in middle of pool; holding long barbell performed alternating LE step downs  from step with therapist supporting barbell but allowing weight shift forwards and backwards.  Pt progressed to performing with <10% assistance from PT for balance and stabilization.  Changed to alternating lateral step downs to L and R with PT supporting barbell but allowing barbell and patient's weight to shift laterally L; with repetition pt demonstrated increased control of weight shift to L.  Transitioned to standing on pool floor with R then L foot on step, UE support on long barbell.  Cued pt to shift weight to stance LE and maintain SLS balance while lifting other foot off of step and holding x 3-4 seconds.  Required increased  assistance and support to maintain balance when standing on LLE.  Supervision when standing on R.  Transitioned to standing at stairs.  Performed 20 reps stand > tall kneeling > stand at bottom step beginning with significant UE support on rails during both transitions progressing to no UE support stand > tall kneeling and light finger tip touch for tall kneeling > stand.  Performed tall lunge at step with front foot on 1st step, performed 10 reps split squats on each LE with UE support on rails.  Finished with R hamstring stretch at stairs with foot on 1st step for hip flexion with knee extension and then leaning forwards over RLE.  Performed x 2 on RLE, 1x on LLE.  Pt requires buoyancy of water for support for joint offloading for pain reduction in low back, to reduce fall risk with gait training and balance exercises with minimal UE support; exercises able to be performed safely in water without the risk of fall compared to those same exercises performed on land;  viscosity of water needed for resistance for strengthening.  Current of water provides perturbations for challenging static & dynamic standing balance.    PT Short Term Goals - 09/03/21 1148       PT SHORT TERM GOAL #1   Title = LTG               PT Long Term Goals - 10/08/21 1940       PT LONG TERM GOAL #1   Title Pt will demonstrate independence with final HEP, will return to walking program 2-3x/week with LRAD, and will return to chair yoga at [facility]    Time 5    Period Weeks    Status Revised    Target Date 11/12/21      PT LONG TERM GOAL #2   Title Pt will demonstrate ability to stand from floor without the use of UE MOD I    Time 5    Period Weeks    Status Revised    Target Date 11/12/21      PT LONG TERM GOAL #3   Title Pt will increase FGA score to >/= 22/30 to indicate decreased falls risk    Baseline 18/30 > 20/30    Time 5    Period Weeks    Status Revised    Target Date 11/12/21      PT LONG TERM  GOAL #5   Title Pt will ambulate x 1000 over variety of surfaces, up/down curb, and across parking lot with no AD, independently and perform visual scanning for safety without evidence of imbalance    Baseline MOD I except supervision for curbs, compliant surfaces    Time 5    Period Weeks    Status Revised    Target Date 11/12/21  Plan - 11/03/21 1507     Clinical Impression Statement Pt demonstrated significant progress with standing balance, balance reactions and weight shifting today when performing retro gait, step downs and single leg stance activities.  Will provide pt with final aquatic HEP next aquatic session.    Personal Factors and Comorbidities Comorbidity 2;Past/Current Experience;Social Background    Examination-Activity Limitations Bend;Locomotion Level;Stairs;Stand;Squat;Transfers    Examination-Participation Restrictions Cleaning;Community Activity;Yard Work    Rehab Potential Good    PT Frequency 1x / week    PT Duration 6 weeks    PT Treatment/Interventions ADLs/Self Care Home Management;Aquatic Therapy;Canalith Repostioning;DME Instruction;Gait training;Stair training;Functional mobility training;Therapeutic activities;Therapeutic exercise;Balance training;Neuromuscular re-education;Patient/family education;Vestibular    PT Next Visit Plan Give and review aquatic HEP; check LTG, D/C    Consulted and Agree with Plan of Care Patient             Patient will benefit from skilled therapeutic intervention in order to improve the following deficits and impairments:  Abnormal gait, Decreased balance, Difficulty walking, Impaired perceived functional ability, Other (comment) (disequilibrium)  Visit Diagnosis: Difficulty in walking, not elsewhere classified  Unsteadiness on feet  Repeated falls     Problem List Patient Active Problem List   Diagnosis Date Noted   History of atrial fibrillation 09/25/2021   Heart disease, unspecified  09/25/2021   Balance problems 05/21/2020    Rico Junker, PT, DPT 11/03/21    3:19 PM    [facility] [address] [place], [place], [address] Phone: [phone]   Fax:  [phone]  Name: Ginnette Berardino Weilbacher MRN: [id number] Date of Birth: [birth year]

## 2021-11-10 ENCOUNTER — Ambulatory Visit: Payer: Medicare Other | Admitting: Physical Therapy

## 2021-11-10 ENCOUNTER — Other Ambulatory Visit: Payer: Self-pay

## 2021-11-10 DIAGNOSIS — R262 Difficulty in walking, not elsewhere classified: Secondary | ICD-10-CM | POA: Diagnosis not present

## 2021-11-10 DIAGNOSIS — R296 Repeated falls: Secondary | ICD-10-CM

## 2021-11-10 DIAGNOSIS — R2681 Unsteadiness on feet: Secondary | ICD-10-CM

## 2021-11-10 NOTE — Patient Instructions (Signed)
Access Code: [email or web address] URL: [email or web address] Date: 11/10/2021 Prepared by: Bufford Lope  Exercises Side Stepping - 1 x daily - 7 x weekly - 2 sets - 10 reps Backward Walking - 1 x daily - 7 x weekly - 2 sets - 10 reps Forward March with Hand Floats - 1 x daily - 7 x weekly - 2 sets - 10 reps Forward Tandem Walking with Hand Floats - 1 x daily - 7 x weekly - 2 sets - 10 reps Squat - 1 x daily - 7 x weekly - 2 sets - 10 reps Standing Hip Abduction Adduction at Pool Wall - 1 x daily - 7 x weekly - 2 sets - 10 reps Alternating Backward Lunge - 1 x daily - 7 x weekly - 1 sets - 10 reps Gathering - 1 x daily - 7 x weekly - 1 sets - 10 reps  Also advised pt to continue to work on step ups and step downs on stairs and to use long ramp at [facility] to practice walking up/down inclines with longer steps.

## 2021-11-10 NOTE — Therapy (Signed)
[facility] [address] [place], [place], [address] Phone: [phone]   Fax:  [phone]  Physical Therapy Treatment and 10th Visit Progress Note  Patient Details  Name: Lisa Moore MRN: [id number] Date of Birth: Jun 06, 1951 Referring Provider (PT): Blenda Nicely Henderson Cloud, MD   Encounter Date: 11/10/2021  Progress Note Reporting Period 09/22/21 to 11/10/21  See note below for Objective Data and Assessment of Progress/Goals.     PT End of Session - 11/10/21 2125     Visit Number 30    Number of Visits 31    Date for PT Re-Evaluation 11/19/21    Authorization Type Medicare    Progress Note Due on Visit 41    PT Start Time 1100    PT Stop Time 1143    PT Time Calculation (min) 43 min    Equipment Utilized During Treatment Other (comment)   aqua jogger barbell   Activity Tolerance Patient tolerated treatment well    Behavior During Therapy WFL for tasks assessed/performed             Past Medical History:  Diagnosis Date   Arthritis    Benign hematuria    GERD (gastroesophageal reflux disease)    Migraines    rare   Myocardial infarction ([place]) 2022   Ablation 2022   Persistent atrial fibrillation ([place]) 05/21/2020   Typical atrial flutter ([place]) 11/13/2020    Past Surgical History:  Procedure Laterality Date   ABDOMINAL HYSTERECTOMY     APPENDECTOMY     ATRIAL FIBRILLATION ABLATION N/A 01/13/2021   Procedure: ATRIAL FIBRILLATION ABLATION;  Surgeon: Thompson Grayer, MD;  Location: [facility];  Service: Cardiovascular;  Laterality: N/A;   CARDIOVERSION N/A 07/07/2020   Procedure: CARDIOVERSION;  Surgeon: Josue Hector, MD;  Location: [facility] ENDOSCOPY;  Service: Cardiovascular;  Laterality: N/A;   CARDIOVERSION N/A 11/07/2020   Procedure: CARDIOVERSION;  Surgeon: Josue Hector, MD;  Location[facility] ENDOSCOPY;  Service: Cardiovascular;  Laterality: N/A;    There were no vitals filed for this  visit.   Subjective Assessment - 11/10/21 2122     Subjective Back is feeling better; didn't go to yoga.   Did go for walks.  Would like to re-try walking in 65ft deep water today.    Pertinent History OA, Migraines, a-fib with cardioversion - pt elected to stop Eliquis    Limitations Standing;Walking;House hold activities    Diagnostic tests MRI in 2021: negative brain MRI for age, no acute abnormality    Patient Stated Goals To remain independent, prevent falls, be able to garden, to be able to get off the floor independently.    Currently in Pain? No/denies             Patient seen for aquatic therapy today.  Treatment took place in water 3-4 feet deep depending upon activity.  Pt entered and exited the pool via stairs with rail, alternating sequence to descend and ascend.  Reviewed pool HEP:  Access Code: [email or web address] URL: [email or web address] Date: 11/10/2021 Prepared by: Misty Stanley  Exercises using Aqua Jogger for UE support; intermittent use of aqua jogger against wall for increased stability  Side Stepping - 1 x daily - 7 x weekly - 2 sets - 10 reps Backward Walking - 1 x daily - 7 x weekly - 2 sets - 10 reps Forward March with Hand Floats - 1 x daily - 7 x weekly - 2 sets - 10 reps Forward Tandem Walking with  Hand Floats - 1 x daily - 7 x weekly - 2 sets - 10 reps Squat - 1 x daily - 7 x weekly - 2 sets - 10 reps Standing Hip Abduction at Pool Wall - 1 x daily - 7 x weekly - 2 sets - 10 reps Alternating Backward Lunge - 1 x daily - 7 x weekly - 1 sets - 10 reps Gathering - 1 x daily - 7 x weekly - 1 sets - 10 reps  Also advised pt to continue to work on step ups and step downs on stairs and to use long ramp at [facility] to practice walking up/down inclines with longer steps.  Completed session with ambulation across width of pool x 3 reps in 18ft of water with UE support on aqua jogger and beginning with PT in front of patient for stability but progressing to PT  ambulating beside pt only providing supervision.  Pt able to perform and maintain normal step and stride length and no significant LOB.  Pt requires buoyancy of water for support for joint offloading for pain reduction in hip and low back, to reduce fall risk with gait training and balance exercises with minimal UE support; exercises able to be performed safely in water without the risk of fall compared to those same exercises performed on land;  viscosity of water needed for resistance for strengthening.  Current of water provides perturbations for challenging static & dynamic standing balance.     PT Short Term Goals - 09/03/21 1148       PT SHORT TERM GOAL #1   Title = LTG               PT Long Term Goals - 10/08/21 1940       PT LONG TERM GOAL #1   Title Pt will demonstrate independence with final HEP, will return to walking program 2-3x/week with LRAD, and will return to chair yoga at [facility]    Time 5    Period Weeks    Status Revised    Target Date 11/12/21      PT LONG TERM GOAL #2   Title Pt will demonstrate ability to stand from floor without the use of UE MOD I    Time 5    Period Weeks    Status Revised    Target Date 11/12/21      PT LONG TERM GOAL #3   Title Pt will increase FGA score to >/= 22/30 to indicate decreased falls risk    Baseline 18/30 > 20/30    Time 5    Period Weeks    Status Revised    Target Date 11/12/21      PT LONG TERM GOAL #5   Title Pt will ambulate x 1000 over variety of surfaces, up/down curb, and across parking lot with no AD, independently and perform visual scanning for safety without evidence of imbalance    Baseline MOD I except supervision for curbs, compliant surfaces    Time 5    Period Weeks    Status Revised    Target Date 11/12/21              Plan - 11/10/21 2131     Clinical Impression Statement Today was pt's final day of aquatic therapy; session focused on review of final aquatic HEP.  Discussed use of aqua  jogger or wall for UE support but self-progressing away from increased UE support.  Will finalize handout and provide to pt  at final clinic visit after re-assessment of goals.  Pt has made excellent progress and no longer experiences symptoms of dizziness, no longer utilizes trekking pole in community except when walking for exercise and was able to ambulate in deeper water today without significant LOB.    Personal Factors and Comorbidities Comorbidity 2;Past/Current Experience;Social Background    Examination-Activity Limitations Bend;Locomotion Level;Stairs;Stand;Squat;Transfers    Examination-Participation Restrictions Cleaning;Community Activity;Yard Work    Rehab Potential Good    PT Frequency 1x / week    PT Duration 6 weeks    PT Treatment/Interventions ADLs/Self Care Home Management;Aquatic Therapy;Canalith Repostioning;DME Instruction;Gait training;Stair training;Functional mobility training;Therapeutic activities;Therapeutic exercise;Balance training;Neuromuscular re-education;Patient/family education;Vestibular    PT Next Visit Plan Print HEP, laminate, check LTG, D/C    Consulted and Agree with Plan of Care Patient             Patient will benefit from skilled therapeutic intervention in order to improve the following deficits and impairments:  Abnormal gait, Decreased balance, Difficulty walking, Impaired perceived functional ability, Other (comment) (disequilibrium)  Visit Diagnosis: Difficulty in walking, not elsewhere classified  Unsteadiness on feet  Repeated falls     Problem List Patient Active Problem List   Diagnosis Date Noted   History of atrial fibrillation 09/25/2021   Heart disease, unspecified 09/25/2021   Balance problems 05/21/2020    Rico Junker, PT, DPT 11/10/21    9:34 PM    [place] [facility] [address] [place], [place], [address] Phone: [phone]   Fax:  [phone]  Name:  Lisa Moore MRN: [id number] Date of Birth: [birth year]

## 2021-11-16 ENCOUNTER — Ambulatory Visit: Payer: Medicare Other | Attending: Otolaryngology | Admitting: Physical Therapy

## 2021-11-16 ENCOUNTER — Other Ambulatory Visit: Payer: Self-pay

## 2021-11-16 DIAGNOSIS — R262 Difficulty in walking, not elsewhere classified: Secondary | ICD-10-CM | POA: Diagnosis not present

## 2021-11-16 DIAGNOSIS — R2681 Unsteadiness on feet: Secondary | ICD-10-CM | POA: Diagnosis not present

## 2021-11-16 DIAGNOSIS — R296 Repeated falls: Secondary | ICD-10-CM | POA: Insufficient documentation

## 2021-11-16 NOTE — Patient Instructions (Signed)
Access Code: [email or web address] ?URL: [email or web address] ?Date: 11/16/2021 ?Prepared by: Bufford Lope ? ?Program Notes ?Also work on step ups and step downs on stairs ?Use long ramp to practice walking up/down inclines  ? ? ?Exercises ?Side Stepping - 1 x daily - 7 x weekly - 2 sets - 10 reps ?Backward Walking - 1 x daily - 7 x weekly - 2 sets - 10 reps ?Forward Backward March with Hand Floats - 1 x daily - 7 x weekly - 2 sets - 10 reps ?Forward Backward Tandem Walking with Hand Floats - 1 x daily - 7 x weekly - 2 sets - 10 reps ?Squat - 1 x daily - 7 x weekly - 2 sets - 10 reps ?Standing Hip Abduction Adduction at Pool Wall - 1 x daily - 7 x weekly - 2 sets - 10 reps ?Alternating Backward Lunge - 1 x daily - 7 x weekly - 1 sets - 10 reps ?Gathering - 1 x daily - 7 x weekly - 1 sets - 10 reps ? ?

## 2021-11-16 NOTE — Therapy (Signed)
[facility] [address] [place], [place], [address] Phone: [phone]   Fax:  [phone]  Physical Therapy Treatment and D/C Summary  Patient Details  Name: Lisa Moore MRN: [id number] Date of Birth: 1951-09-08 Referring Provider (PT): Gavin Pound, MD   Encounter Date: 11/16/2021   PT End of Session - 11/16/21 1251     Visit Number 31    Number of Visits 31    Date for PT Re-Evaluation 11/19/21    Authorization Type Medicare    Progress Note Due on Visit 31    PT Start Time 1105    PT Stop Time 1145    PT Time Calculation (min) 40 min    Activity Tolerance Patient tolerated treatment well    Behavior During Therapy [facility] for tasks assessed/performed             Past Medical History:  Diagnosis Date   Arthritis    Benign hematuria    GERD (gastroesophageal reflux disease)    Migraines    rare   Myocardial infarction ([place]) 2022   Ablation 2022   Persistent atrial fibrillation ([place]) 05/21/2020   Typical atrial flutter ([place]) 11/13/2020    Past Surgical History:  Procedure Laterality Date   ABDOMINAL HYSTERECTOMY     APPENDECTOMY     ATRIAL FIBRILLATION ABLATION N/A 01/13/2021   Procedure: ATRIAL FIBRILLATION ABLATION;  Surgeon: Thompson Grayer, MD;  Location: [place] [facility] LAB;  Service: Cardiovascular;  Laterality: N/A;   CARDIOVERSION N/A 07/07/2020   Procedure: CARDIOVERSION;  Surgeon: Josue Hector, MD;  Location: [facility] ENDOSCOPY;  Service: Cardiovascular;  Laterality: N/A;   CARDIOVERSION N/A 11/07/2020   Procedure: CARDIOVERSION;  Surgeon: Josue Hector, MD;  Location: [facility] ENDOSCOPY;  Service: Cardiovascular;  Laterality: N/A;    There were no vitals filed for this visit.   Subjective Assessment - 11/16/21 1108     Subjective Tried the Water Walking class; not an option for ongoing exercise right now.  Having R low back pain today; did a lot of yard work and lifting heavy cases of  water.    Pertinent History OA, Migraines, a-fib with cardioversion - pt elected to stop Eliquis    Limitations Standing;Walking;House hold activities    Diagnostic tests MRI in 2021: negative brain MRI for age, no acute abnormality    Patient Stated Goals To remain independent, prevent falls, be able to garden, to be able to get off the floor independently.    Currently in Pain? No/denies               [facility] PT Assessment - 11/16/21 1122       Transfers   Transfers Floor to Transfer    Floor to Transfer 6: Modified independent (Device/Increase time)    Floor to Transfer Details (indicate cue type and reason) Performed from tall kneeling >half kneeling > stand from R and LLE with UE support on mat.      Functional Gait  Assessment   Gait assessed  Yes    Gait Level Surface Walks 20 ft in less than 5.5 sec, no assistive devices, good speed, no evidence for imbalance, normal gait pattern, deviates no more than 6 in outside of the 12 in walkway width.    Change in Gait Speed Able to smoothly change walking speed without loss of balance or gait deviation. Deviate no more than 6 in outside of the 12 in walkway width.    Gait with Horizontal  Head Turns Performs head turns smoothly with no change in gait. Deviates no more than 6 in outside 12 in walkway width    Gait with Vertical Head Turns Performs task with slight change in gait velocity (eg, minor disruption to smooth gait path), deviates 6 - 10 in outside 12 in walkway width or uses assistive device    Gait and Pivot Turn Pivot turns safely within 3 sec and stops quickly with no loss of balance.    Step Over Obstacle Is able to step over one shoe box (4.5 in total height) without changing gait speed. No evidence of imbalance.    Gait with Narrow Base of Support Ambulates less than 4 steps heel to toe or cannot perform without assistance.    Gait with Eyes Closed Walks 20 ft, slow speed, abnormal gait pattern, evidence for imbalance,  deviates 10-15 in outside 12 in walkway width. Requires more than 9 sec to ambulate 20 ft.    Ambulating Backwards Walks 20 ft, uses assistive device, slower speed, mild gait deviations, deviates 6-10 in outside 12 in walkway width.    Steps Alternating feet, must use rail.    Total Score 21    FGA comment: 21/30               PT Education - 11/16/21 1250     Education Details Progress towards goals; Final aquatic HEP    Person(s) Educated Patient    Methods Explanation;Handout    Comprehension Verbalized understanding            Verbally reviewed patient's final aquatic HEP below and verbally discussed ways to self-progress each exercise:  Access Code: [email or web address] URL: [email or web address] Date: 11/16/2021 Prepared by: Misty Stanley  Program Notes Also work on step ups and step downs on stairs Use long ramp to practice walking up/down inclines    Exercises Side Stepping - 1 x daily - 7 x weekly - 2 sets - 10 reps Backward Walking - 1 x daily - 7 x weekly - 2 sets - 10 reps Forward Backward March with Hand Floats - 1 x daily - 7 x weekly - 2 sets - 10 reps Forward Backward Tandem Walking with Hand Floats - 1 x daily - 7 x weekly - 2 sets - 10 reps Squat - 1 x daily - 7 x weekly - 2 sets - 10 reps Standing Hip Abduction Adduction at Pool Wall - 1 x daily - 7 x weekly - 2 sets - 10 reps Alternating Backward Lunge - 1 x daily - 7 x weekly - 1 sets - 10 reps Gathering - 1 x daily - 7 x weekly - 1 sets - 10 reps   PT Short Term Goals - 09/03/21 1148       PT SHORT TERM GOAL #1   Title = LTG               PT Long Term Goals - 11/16/21 1120       PT LONG TERM GOAL #1   Title Pt will demonstrate independence with final HEP, will return to walking program 2-3x/week with LRAD, and will return to chair yoga at [facility]    Time 5    Period Weeks    Status Achieved    Target Date 11/12/21      PT LONG TERM GOAL #2   Title Pt will demonstrate ability to  stand from floor without the use of UE MOD I    Baseline able to  perform from R and LLE but still needs UE support    Time 5    Period Weeks    Status Partially Met    Target Date 11/12/21      PT LONG TERM GOAL #3   Title Pt will increase FGA score to >/= 22/30 to indicate decreased falls risk    Baseline 21/30    Time 5    Period Weeks    Status Partially Met    Target Date 11/12/21      PT LONG TERM GOAL #5   Title Pt will ambulate x 1000 over variety of surfaces, up/down curb, and across parking lot with no AD, independently and perform visual scanning for safety without evidence of imbalance    Time 5    Period Weeks    Status Achieved    Target Date 11/12/21                   Plan - 11/16/21 1252     Clinical Impression Statement Treatment session today focused on assessment of progress towards LTG.  Pt has made good progress and has met 2 LTG.  Pt is working on identifying an appropriate aquatic group class to participate, has returned to chair yoga and has reviewed final aquatic HEP with PT for ongoing balance training and wellness.  PT also discussed with pt ways to self progress HEP to assist in progressing to a level where she could participate in other aquatic classes if desired.  Pt has also met outdoor/community gait goal and is now able to ambulate longer distances and across parking lots without trekking pole without significant LOB.  Pt has partially met FGA goal and partially met standing from floor goal.  Pt is at lower falls risk but continues to have increased difficulty with SLS tasks (stepping over objects, descending stairs), when vision removed, and narrow BOS.  These have been incorporated into HEP.  Pt is now able to perform floor <> stand transfer with R or LLE but continues to require UE support to perform safely.  Pt is pleased with progress and is safe for D/C today.    Personal Factors and Comorbidities Comorbidity 2;Past/Current Experience;Social  Background    Examination-Activity Limitations Bend;Locomotion Level;Stairs;Stand;Squat;Transfers    Examination-Participation Restrictions Cleaning;Community Activity;Yard Work    Rehab Potential Good    PT Frequency 1x / week    PT Duration 6 weeks    PT Treatment/Interventions ADLs/Self Care Home Management;Aquatic Therapy;Canalith Repostioning;DME Instruction;Gait training;Stair training;Functional mobility training;Therapeutic activities;Therapeutic exercise;Balance training;Neuromuscular re-education;Patient/family education;Vestibular    Consulted and Agree with Plan of Care Patient             Patient will benefit from skilled therapeutic intervention in order to improve the following deficits and impairments:  Abnormal gait, Decreased balance, Difficulty walking, Impaired perceived functional ability, Other (comment) (disequilibrium)  Visit Diagnosis: Difficulty in walking, not elsewhere classified  Unsteadiness on feet  Repeated falls     Problem List Patient Active Problem List   Diagnosis Date Noted   History of atrial fibrillation 09/25/2021   Heart disease, unspecified 09/25/2021   Balance problems 05/21/2020    PHYSICAL THERAPY DISCHARGE SUMMARY  Visits from Start of Care: 31  Current functional level related to goals / functional outcomes: Please see LTG achievement and impression statement above.   Remaining deficits: Low back pain, impaired SLS, impaired balance with narrow BOS and when vision removed.   Education / Equipment: Land and aquatic HEP  Patient agrees to discharge. Patient goals were partially met. Patient is being discharged due to being pleased with the current functional level.  Rico Junker, PT, DPT 11/16/21    1:10 PM    [facility] [address] [place], [place], [address] Phone: [phone]   Fax:  [phone]  Name: Lisa Moore MRN: [id number] Date of  Birth: May 24, 1951

## 2021-11-27 ENCOUNTER — Other Ambulatory Visit (HOSPITAL_BASED_OUTPATIENT_CLINIC_OR_DEPARTMENT_OTHER): Payer: Self-pay

## 2021-11-27 ENCOUNTER — Ambulatory Visit (INDEPENDENT_AMBULATORY_CARE_PROVIDER_SITE_OTHER): Payer: Medicare Other

## 2021-11-27 ENCOUNTER — Ambulatory Visit (INDEPENDENT_AMBULATORY_CARE_PROVIDER_SITE_OTHER): Payer: Medicare Other | Admitting: Orthopaedic Surgery

## 2021-11-27 ENCOUNTER — Other Ambulatory Visit: Payer: Self-pay

## 2021-11-27 DIAGNOSIS — G8929 Other chronic pain: Secondary | ICD-10-CM | POA: Diagnosis not present

## 2021-11-27 DIAGNOSIS — M5441 Lumbago with sciatica, right side: Secondary | ICD-10-CM | POA: Diagnosis not present

## 2021-11-27 MED ORDER — METHOCARBAMOL 500 MG PO TABS
500.0000 mg | ORAL_TABLET | Freq: Two times a day (BID) | ORAL | 0 refills | Status: DC | PRN
  Filled 2021-11-27: qty 20, 10d supply, fill #0

## 2021-11-27 MED ORDER — PREDNISONE 10 MG PO TABS
ORAL_TABLET | ORAL | 0 refills | Status: DC
  Filled 2021-11-27: qty 21, 6d supply, fill #0

## 2021-11-27 NOTE — Progress Notes (Signed)
? ?Office Visit Note ?  ?Patient: Lisa Moore           ?Date of Birth: [birth year]           ?MRN: [id number] ?Visit Date: 11/27/2021 ?             ?Requested by: Tollie Eth, NP ?[phone] ?[address] ?[place],  [place] [address] ?PCP: Tollie Eth, NP ? ? ?Assessment & Plan: ?Visit Diagnoses:  ?1. Chronic right-sided low back pain with right-sided sciatica   ? ? ?Plan: Impression is chronic right low back pain and right lower extremity radiculopathy with probable underlying disc bulge.  We discussed starting her on a steroid pack and muscle relaxer.  If her symptoms do not improve over the next week or so we will get an MRI to assess for structural abnormalities.  Follow-up with [place] as needed. ? ?Follow-Up Instructions: Return if symptoms worsen or fail to improve.  ? ?Orders:  ?Orders Placed This Encounter  ?Procedures  ? XR Lumbar Spine 2-3 Views  ? ?No orders of the defined types were placed in this encounter. ? ? ? ? Procedures: ?No procedures performed ? ? ?Clinical Data: ?No additional findings. ? ? ?Subjective: ?Chief Complaint  ?Patient presents with  ? Right Leg - Pain  ? ? ?HPI patient is a pleasant 71 year old female who comes in today with right low back pain and right lower extremity radiculopathy.  She has had ongoing symptoms for years which significantly worsened last Friday.  She had previously been gardening where she was doing a lot of bending over planting flowers.  That evening, she sat on the toilet and was unable to get up.  Her pain has eased over the past week but she still complains of right low back pain with paresthesias to the right lower extremity.  She feels as though her right leg is heavy.  Symptoms are worse when she is lying on the right side in the bed as well as when she stands from a lying or seated position.  She has been taking Tylenol with good relief.  She has had similar issues in the past where she has seen Dr. Prince Rome and been on steroids which have helped.  She  has also been seen by Dr. Alvester Morin where [facility] was performed many years ago. ? ?Review of Systems as detailed in HPI.  All others reviewed and are negative. ? ? ?Objective: ?Vital Signs: LMP  (LMP Unknown)  ? ?Physical Exam well-developed well-nourished female no acute distress.  Alert and oriented x3. ? ?Ortho Exam lumbar spine exam shows no spinous tenderness.  She does have tenderness to the right-sided paraspinous musculature.  Increased pain with lumbar extension and rotation to the right.  Positive straight leg raise on the right.  No focal weakness.  She is neurovascular intact distally. ? ?Specialty Comments:  ?No specialty comments available. ? ?Imaging: ?No results found. ? ? ?PMFS History: ?Patient Active Problem List  ? Diagnosis Date Noted  ? History of atrial fibrillation 09/25/2021  ? Heart disease, unspecified 09/25/2021  ? Balance problems 05/21/2020  ? ?Past Medical History:  ?Diagnosis Date  ? Arthritis   ? Benign hematuria   ? GERD (gastroesophageal reflux disease)   ? Migraines   ? rare  ? Myocardial infarction [facility]) 2022  ? Ablation 2022  ? Persistent atrial fibrillation ([facility]) 05/21/2020  ? Typical atrial flutter ([facility]) 11/13/2020  ?  ?Family History  ?Problem Relation Age of Onset  ?  Hypertension Mother   ? Cancer Father   ? COPD Father   ? Diabetes Father   ? Cancer Sister   ? Kidney disease Maternal Grandmother   ? Cancer Paternal Grandmother   ?  ?Past Surgical History:  ?Procedure Laterality Date  ? ABDOMINAL HYSTERECTOMY    ? APPENDECTOMY    ? ATRIAL FIBRILLATION ABLATION N/A 01/13/2021  ? Procedure: ATRIAL FIBRILLATION ABLATION;  Surgeon: Hillis Range, MD;  Location: [facility] INVASIVE CV LAB;  Service: Cardiovascular;  Laterality: N/A;  ? CARDIOVERSION N/A 07/07/2020  ? Procedure: CARDIOVERSION;  Surgeon: Wendall Stade, MD;  Location: [facility] ENDOSCOPY;  Service: Cardiovascular;  Laterality: N/A;  ? CARDIOVERSION N/A 11/07/2020  ? Procedure: CARDIOVERSION;  Surgeon: Wendall Stade, MD;  Location: [facility]  ENDOSCOPY;  Service: Cardiovascular;  Laterality: N/A;  ? ?Social History  ? ?Occupational History  ? Not on file  ?Tobacco Use  ? Smoking status: Never  ? Smokeless tobacco: Never  ?Vaping Use  ? Vaping Use: Never used  ?Substance and Sexual Activity  ? Alcohol use: Yes  ?  Alcohol/week: 1.0 standard drink  ?  Types: 1 Shots of liquor per week  ?  Comment: occasional beer few times a month, not regularly  ? Drug use: No  ? Sexual activity: Not Currently  ? ? ? ? ? ? ?

## 2022-01-20 DIAGNOSIS — L820 Inflamed seborrheic keratosis: Secondary | ICD-10-CM | POA: Diagnosis not present

## 2022-04-28 ENCOUNTER — Other Ambulatory Visit (HOSPITAL_BASED_OUTPATIENT_CLINIC_OR_DEPARTMENT_OTHER): Payer: Self-pay

## 2022-04-30 ENCOUNTER — Other Ambulatory Visit (HOSPITAL_BASED_OUTPATIENT_CLINIC_OR_DEPARTMENT_OTHER): Payer: Self-pay

## 2022-05-03 DIAGNOSIS — R202 Paresthesia of skin: Secondary | ICD-10-CM | POA: Diagnosis not present

## 2022-05-03 DIAGNOSIS — R1084 Generalized abdominal pain: Secondary | ICD-10-CM | POA: Diagnosis not present

## 2022-05-03 DIAGNOSIS — I4891 Unspecified atrial fibrillation: Secondary | ICD-10-CM | POA: Diagnosis not present

## 2022-05-03 DIAGNOSIS — R079 Chest pain, unspecified: Secondary | ICD-10-CM | POA: Diagnosis not present

## 2022-06-23 IMAGING — MG MM DIGITAL SCREENING BILAT W/ TOMO AND CAD
6 of 12 series · 6 of 36 positions shown · non-contrast
Comparison: Previous exam(s).

ACR Breast Density Category a: The breast tissue is almost entirely
fatty.

CLINICAL DATA: Screening.

EXAM:
DIGITAL SCREENING BILATERAL MAMMOGRAM WITH TOMOSYNTHESIS AND CAD
TECHNIQUE: Bilateral screening digital craniocaudal and mediolateral oblique
mammograms were obtained. Bilateral screening digital breast
tomosynthesis was performed. The images were evaluated with
computer-aided detection.

[R CC synth-2D]
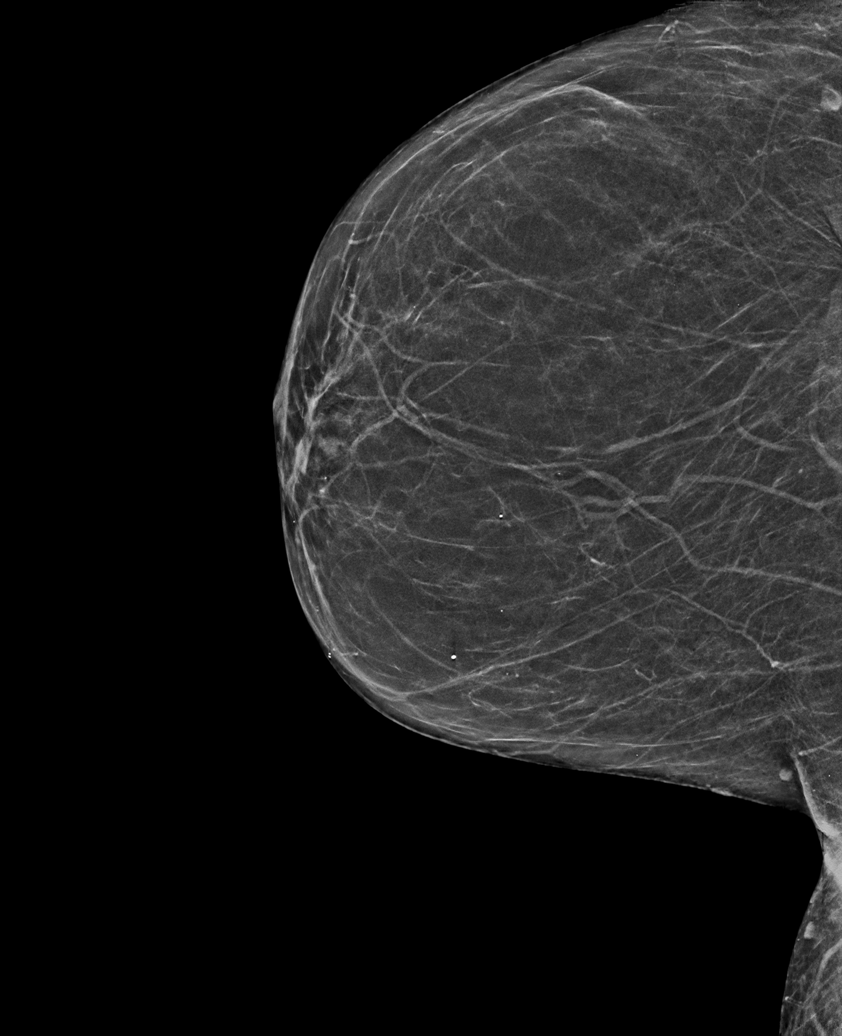

[R MLO synth-2D]
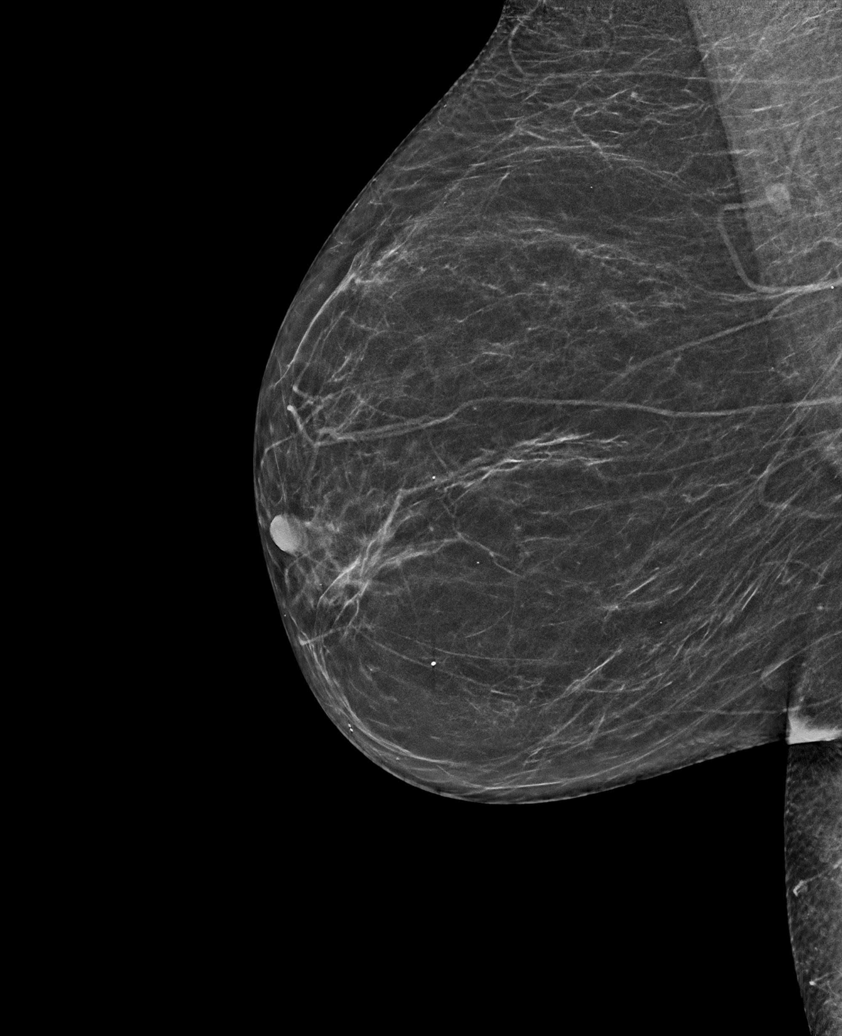

[L MLO synth-2D (1 of 2)]
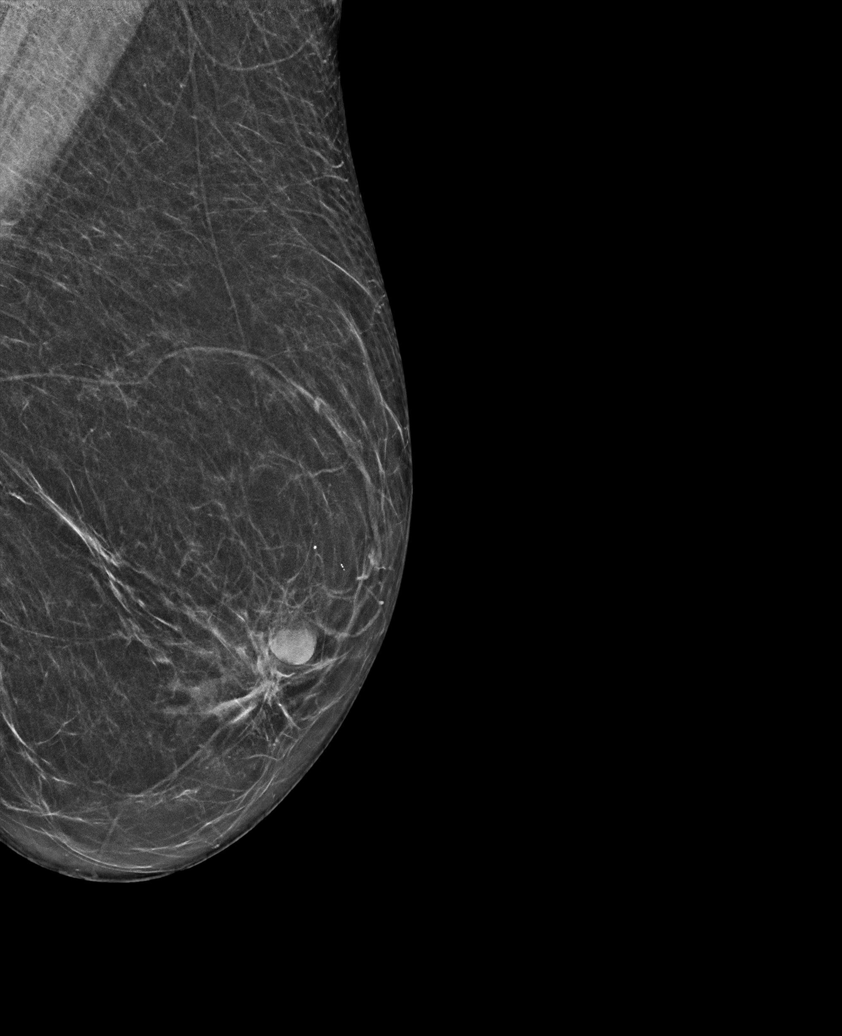

[R XCCL synth-2D]
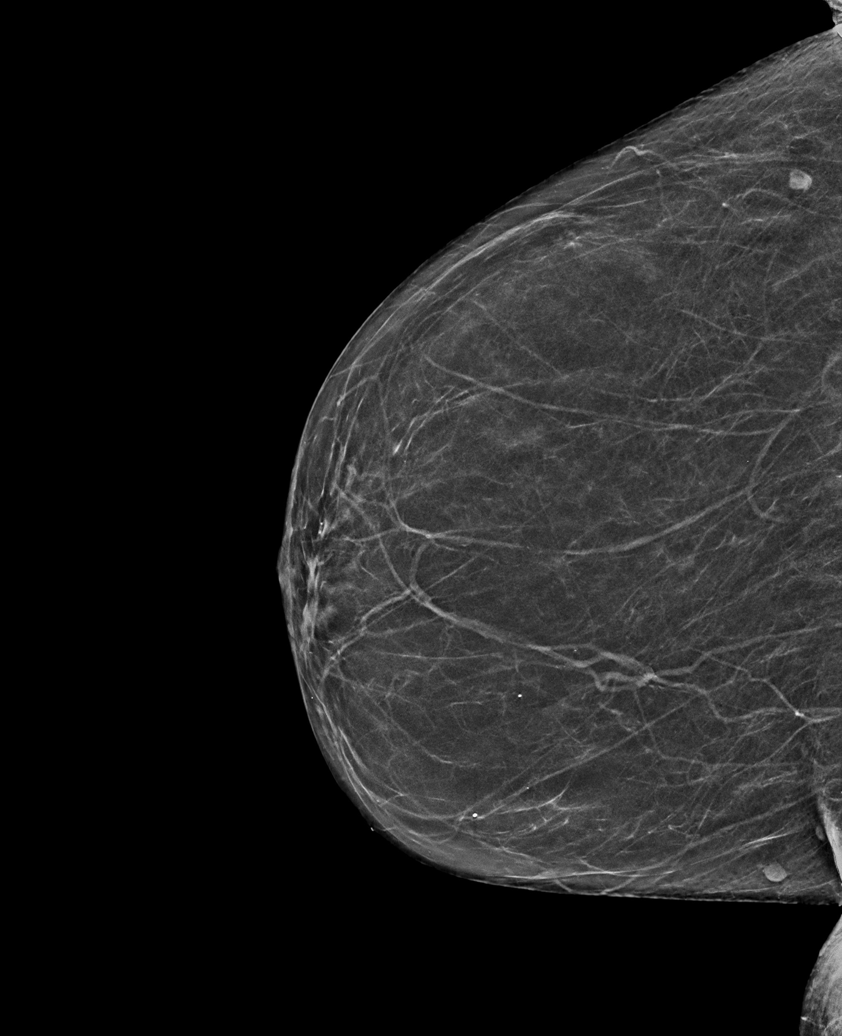

[L CC synth-2D]
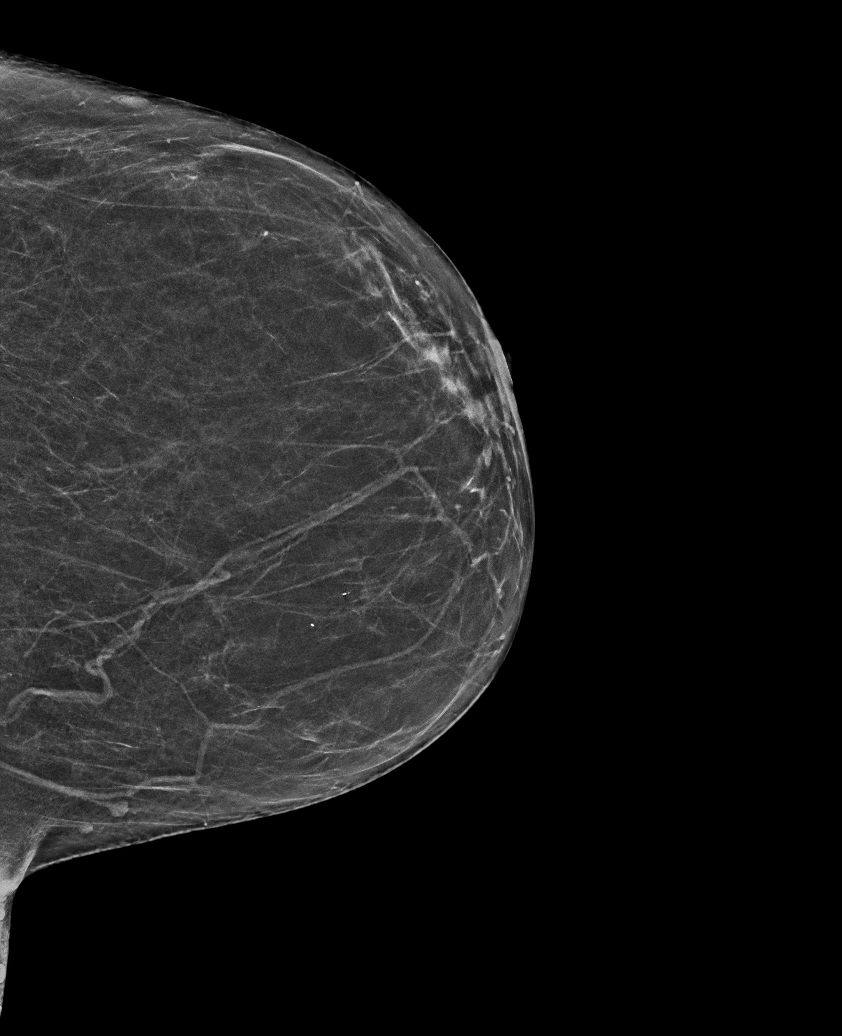

[L MLO synth-2D (2 of 2)]
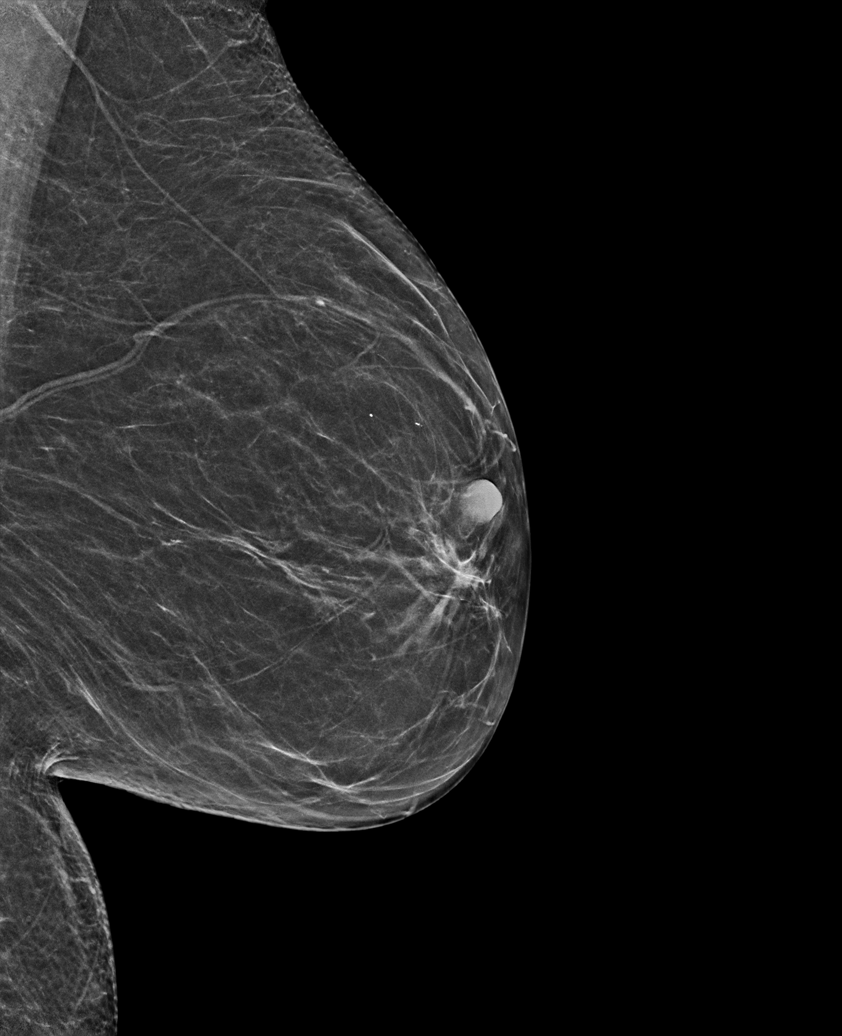

[6 of 36 positions shown; findings below may reference images not displayed]

FINDINGS: There are no findings suspicious for malignancy.
IMPRESSION: No mammographic evidence of malignancy. A result letter of this
screening mammogram will be mailed directly to the patient.

RECOMMENDATION:
Screening mammogram in one year. (Code:0E-3-N98)

BI-RADS CATEGORY  1: Negative.

## 2022-06-25 ENCOUNTER — Other Ambulatory Visit (HOSPITAL_BASED_OUTPATIENT_CLINIC_OR_DEPARTMENT_OTHER): Payer: Self-pay

## 2022-06-25 DIAGNOSIS — Z23 Encounter for immunization: Secondary | ICD-10-CM | POA: Diagnosis not present

## 2022-06-25 MED ORDER — INFLUENZA VAC A&B SA ADJ QUAD 0.5 ML IM PRSY
PREFILLED_SYRINGE | INTRAMUSCULAR | 0 refills | Status: DC
  Filled 2022-06-25: qty 0.5, 1d supply, fill #0

## 2022-06-25 MED ORDER — COVID-19 MRNA 2023-2024 VACCINE (COMIRNATY) 0.3 ML INJECTION
INTRAMUSCULAR | 0 refills | Status: DC
  Filled 2022-06-25: qty 0.3, 1d supply, fill #0

## 2022-07-22 ENCOUNTER — Ambulatory Visit (HOSPITAL_BASED_OUTPATIENT_CLINIC_OR_DEPARTMENT_OTHER): Payer: Medicare Other | Admitting: Nurse Practitioner

## 2022-07-22 ENCOUNTER — Encounter (HOSPITAL_BASED_OUTPATIENT_CLINIC_OR_DEPARTMENT_OTHER): Payer: Self-pay

## 2022-07-22 ENCOUNTER — Other Ambulatory Visit (HOSPITAL_BASED_OUTPATIENT_CLINIC_OR_DEPARTMENT_OTHER): Payer: Self-pay

## 2022-08-12 ENCOUNTER — Encounter: Payer: Self-pay | Admitting: Nurse Practitioner

## 2022-08-12 ENCOUNTER — Ambulatory Visit (INDEPENDENT_AMBULATORY_CARE_PROVIDER_SITE_OTHER): Payer: Medicare Other | Admitting: Nurse Practitioner

## 2022-08-12 VITALS — BP 120/82 | HR 70 | Temp 98.1°F | Ht 65.5 in | Wt 207.8 lb

## 2022-08-12 DIAGNOSIS — I519 Heart disease, unspecified: Secondary | ICD-10-CM

## 2022-08-12 DIAGNOSIS — Z8679 Personal history of other diseases of the circulatory system: Secondary | ICD-10-CM

## 2022-08-12 DIAGNOSIS — R1031 Right lower quadrant pain: Secondary | ICD-10-CM | POA: Diagnosis not present

## 2022-08-12 DIAGNOSIS — R1032 Left lower quadrant pain: Secondary | ICD-10-CM | POA: Diagnosis not present

## 2022-08-12 DIAGNOSIS — Z0000 Encounter for general adult medical examination without abnormal findings: Secondary | ICD-10-CM

## 2022-08-12 HISTORY — DX: Encounter for general adult medical examination without abnormal findings: Z00.00

## 2022-08-12 NOTE — Progress Notes (Signed)
[facility]  [address] [place], [place]  [address] [place] VISIT  08/18/2022  Subjective:  Lisa Moore is a 71 y.o. female patient of Shatyra Becka, Coralee Pesa, NP who had a Medicare Annual Wellness Visit today. Yaribeth is Retired and lives alone.she reports that she is socially active and does interact with friends/family regularly. she is moderately physically active.  Patient Care Team: Cheryllynn Sarff, Coralee Pesa, NP as PCP - General (Nurse Practitioner) Lelon Perla, MD as PCP - Cardiology (Cardiology) Vickie Epley, MD as PCP - Electrophysiology (Cardiology) Otis Brace, MD as Consulting Physician (Gastroenterology)     08/12/2022    8:38 AM 07/07/2021    8:54 AM 01/13/2021    6:00 AM 11/30/2020    8:54 PM 11/07/2020    9:34 AM 07/07/2020    8:17 AM 05/21/2020   10:32 AM  Advanced Directives  Does Patient Have a Medical Advance Directive? Yes Yes Yes Yes Yes Yes Yes  Type of Paramedic of [place];Living will [place];Living will [place];Living will [place];Living will;Out of facility DNR (pink MOST or yellow form) [place];Living will Healthcare Power of [place];Living will  Does patient want to make changes to medical advance directive? Yes (ED - Information included in AVS)  No - Patient declined No - Patient declined   No - Patient declined  Copy of [place] in Chart? Yes - validated most recent copy scanned in chart (See row information)  Yes - validated most recent copy scanned in chart (See row information) Yes - validated most recent copy scanned in chart (See row information) Yes - validated most recent copy scanned in chart (See row information) No - copy requested No - copy requested    Hospital Utilization Over the Past 12 Months: # of  hospitalizations or ER visits: 0 # of surgeries: 0  Review of Systems    Patient reports that her overall health is better when compared to last year.  Review of Systems: All ROS negative.  Pain Assessment 0/10  Current Medications & Allergies (verified) Allergies as of 08/12/2022   No Known Allergies      Medication List        Accurate as of August 12, 2022 11:59 PM. If you have any questions, ask your nurse or doctor.          STOP taking these medications    augmented betamethasone dipropionate 0.05 % cream Commonly known as: DIPROLENE-AF Stopped by: Orma Render, NP   Boostrix 5-2.5-18.5 LF-MCG/0.5 injection Generic drug: Tdap Stopped by: Orma Render, NP   Comirnaty Susp injection Generic drug: COVID-19 mRNA Vac-TriS Therapist, music) Stopped by: Orma Render, NP   diltiazem 120 MG 24 hr capsule Commonly known as: CARDIZEM CD Stopped by: Orma Render, NP   Fluad Quadrivalent 0.5 ML injection Generic drug: influenza vaccine adjuvanted Stopped by: Orma Render, NP   methocarbamol 500 MG tablet Commonly known as: Robaxin Stopped by: Orma Render, NP   predniSONE 10 MG tablet Commonly known as: DELTASONE Stopped by: Orma Render, NP   Shingrix injection Generic drug: Zoster Vaccine Adjuvanted Stopped by: Orma Render, NP       TAKE these medications    acetaminophen 500 MG tablet Commonly known as: TYLENOL Take 1,000 mg by mouth every 6 (six) hours as needed  for moderate pain or headache.   CALCIUM 1200 PO Take 1,200 mg by mouth daily.   D3 2000 50 MCG (2000 UT) Caps Generic drug: Cholecalciferol Take 2,000 Units by mouth daily.        History (reviewed): Past Medical History:  Diagnosis Date   Arthritis    Benign hematuria    GERD (gastroesophageal reflux disease)    Medicare annual wellness visit, subsequent 08/12/2022   Migraines    rare   Myocardial infarction [facility]) 2022   Ablation 2022   Persistent atrial  fibrillation ([place]) 05/21/2020   Typical atrial flutter ([place]) 11/13/2020   Past Surgical History:  Procedure Laterality Date   ABDOMINAL HYSTERECTOMY     APPENDECTOMY     ATRIAL FIBRILLATION ABLATION N/A 01/13/2021   Procedure: ATRIAL FIBRILLATION ABLATION;  Surgeon: Thompson Grayer, MD;  Location: [facility];  Service: Cardiovascular;  Laterality: N/A;   CARDIOVERSION N/A 07/07/2020   Procedure: CARDIOVERSION;  Surgeon: Josue Hector, MD;  Location: [facility] ENDOSCOPY;  Service: Cardiovascular;  Laterality: N/A;   CARDIOVERSION N/A 11/07/2020   Procedure: CARDIOVERSION;  Surgeon: Josue Hector, MD;  Location: [facility] ENDOSCOPY;  Service: Cardiovascular;  Laterality: N/A;   Family History  Problem Relation Age of Onset   Hypertension Mother    Cancer Father    COPD Father    Diabetes Father    Cancer Sister    Kidney disease Maternal Grandmother    Cancer Paternal Grandmother    Social History   Socioeconomic History   Marital status: Widowed    Spouse name: Not on file   Number of children: Not on file   Years of education: Not on file   Highest education level: Not on file  Occupational History   Not on file  Tobacco Use   Smoking status: Never   Smokeless tobacco: Never  Vaping Use   Vaping Use: Never used  Substance and Sexual Activity   Alcohol use: Yes    Alcohol/week: 1.0 standard drink of alcohol    Types: 1 Shots of liquor per week    Comment: occasional beer few times a month, not regularly   Drug use: No   Sexual activity: Not Currently  Other Topics Concern   Not on file  Social History Narrative   Not on file   Social Determinants of Health   Financial Resource Strain: Not on file  Food Insecurity: No Food Insecurity (08/18/2022)   Hunger Vital Sign    Worried About Running Out of Food in the Last Year: Never true    Ran Out of Food in the Last Year: Never true  Transportation Needs: No Transportation Needs (08/18/2022)   PRAPARE - Armed forces logistics/support/administrative officer (Medical): No    Lack of Transportation (Non-Medical): No  Physical Activity: Not on file  Stress: Not on file  Social Connections: Not on file    Activities of Daily Living    08/12/2022    8:40 AM  In your present state of health, do you have any difficulty performing the following activities:  Hearing? 0  Vision? 0  Difficulty concentrating or making decisions? 0  Walking or climbing stairs? 0  Dressing or bathing? 0  Doing errands, shopping? 0  Preparing Food and eating ? N  Using the Toilet? N  In the past six months, have you accidently leaked urine? N  Do you have problems with loss of bowel control? N  Managing your Medications? N  Managing your Finances? N  Housekeeping or managing your Housekeeping? N    Patient Education/Literacy    Exercise Current Exercise Habits: Home exercise routine, Type of exercise: walking, Time (Minutes): 30, Frequency (Times/Week): 3, Weekly Exercise (Minutes/Week): 90, Intensity: Mild, Exercise limited by: None identified  Diet Patient reports consuming  2-3  meals a day  Patient reports that her primary diet is: Regular Patient reports that she does have regular access to food.   Depression Screen    08/12/2022    8:37 AM 09/25/2021    5:33 PM 03/31/2018    8:20 AM  PHQ 2/9 Scores  PHQ - 2 Score 0 0 0     Fall Risk    08/12/2022    8:37 AM 09/25/2021    5:33 PM 03/31/2018    8:20 AM  [place] in the past year? 0 1 No  Number falls in past yr: 0 0   Injury with Fall? 0 0   Risk for fall due to : No Fall Risks Impaired balance/gait   Follow up Falls evaluation completed Falls evaluation completed;Education provided;Falls prevention discussed      Objective:   BP 120/82   Pulse 70   Temp 98.1 F (36.7 C)   Ht 5' 5.5" (1.664 m)   Wt 207 lb 12.8 oz (94.3 kg)   LMP  (LMP Unknown)   BMI 34.05 kg/m   Last Weight  Most recent update: 08/12/2022  8:37 AM    Weight  94.3 kg (207 lb  12.8 oz)             Body mass index is 34.05 kg/m.  Hearing/Vision  Syble did not have difficulty with hearing/understanding during the face-to-face interview Keshia did not have difficulty with her vision during the face-to-face interview Reports that she has had a formal eye exam by an eye care professional within the past year Reports that she has not had a formal hearing evaluation within the past year  Cognitive Function:     No data to display          Normal Cognitive Function Screening: Yes (Normal:0-7, Significant for Dysfunction: >8)  Immunization & Health Maintenance Record Immunization History  Administered Date(s) Administered   COVID-19, mRNA, vaccine(Comirnaty)12 years and older 06/25/2022   Fluad Quad(high Dose 65+) 06/25/2022   Influenza, High Dose Seasonal PF 07/03/2018   Influenza-Unspecified 06/19/2020, 05/28/2021   PFIZER Comirnaty(Gray Top)Covid-19 Tri-Sucrose Vaccine 05/28/2021, 06/25/2022   PFIZER(Purple Top)SARS-COV-2 Vaccination 10/25/2019, 11/19/2019, 06/19/2020   Pneumococcal Conjugate-13 03/31/2018   Tdap 10/12/2021    Health Maintenance  Topic Date Due   COLONOSCOPY (Pts 45-7yrs Insurance coverage will need to be confirmed)  Never done   Pneumonia Vaccine 18+ Years old (2 - PPSV23 or PCV20) 04/01/2019   COVID-19 Vaccine (6 - 2023-24 season) 08/20/2022   Medicare Annual Wellness (AWV)  08/13/2023   MAMMOGRAM  10/13/2023   DTaP/Tdap/Td (2 - Td or Tdap) 10/13/2031   INFLUENZA VACCINE  Completed   DEXA SCAN  Completed   Hepatitis C Screening  Completed   HPV VACCINES  Aged Out   Zoster Vaccines- Shingrix  Discontinued       Assessment  This is a routine wellness examination for [facility].  Health Maintenance: Due or Overdue Health Maintenance Due  Topic Date Due   COLONOSCOPY (Pts 45-27yrs Insurance coverage will need to be confirmed)  Never done   Pneumonia Vaccine 34+ Years old (2 - PPSV23 or PCV20) 04/01/2019  COVID-19  Vaccine (6 - 2023-24 season) 08/20/2022    Virgia [place] [employer] does not need a referral for [employer] Assistance: Care Management:   no Social Work:    no Prescription Assistance:  no Nutrition/Diabetes Education:  no   Plan:  Personalized Goals  Goals Addressed   None    Personalized Health Maintenance & Screening Recommendations  Pneumococcal vaccine  Colorectal cancer screening COVID Booster  Lung Cancer Screening Recommended: no (Low Dose CT Chest recommended if Age 20-80 years, 30 pack-year currently smoking OR have quit w/in past 15 years) Hepatitis C Screening recommended: no HIV Screening recommended: no  Advanced Directives: Written information was not given per the patient's request.  Referrals & Orders Orders Placed This Encounter  Procedures   Lipid panel   Comprehensive metabolic panel   CBC with Differential/Platelet   Ambulatory referral to Gastroenterology    Follow-up Plan Follow-up with Lunabelle Oatley, Sung Amabile, NP as planned   I have personally reviewed and noted the following in the patient's chart:   Medical and social history Use of alcohol, tobacco or illicit drugs  Current medications and supplements Functional ability and status Nutritional status Physical activity Advanced directives List of other physicians Hospitalizations, surgeries, and ER visits in previous 12 months Vitals Screenings to include cognitive, depression, and falls Referrals and appointments  In addition, I have reviewed and discussed with patient certain preventive protocols, quality metrics, and best practice recommendations. A written personalized care plan for preventive services as well as general preventive health recommendations were provided to patient.     Tollie Eth, DNP, AGNP-c   08/18/2022

## 2022-08-12 NOTE — Patient Instructions (Addendum)
  Lisa Moore , Thank you for taking time to come for your Medicare Wellness Visit. I appreciate your ongoing commitment to your health goals. Please review the following plan we discussed and let me know if I can assist you in the future.   These are the goals we discussed:  Goals   None     This is a list of the screening recommended for you and due dates:  Health Maintenance  Topic Date Due   Colon Cancer Screening  Records Needed   Pneumonia Vaccine (2 - PPSV23 or PCV20) Completed- Need Records   Medicare Annual Wellness Visit  04/01/2019   COVID-19 Vaccine (6 - 2023-24 season) 08/20/2022   Mammogram  10/13/2023   DTaP/Tdap/Td vaccine (2 - Td or Tdap) 10/13/2031   Flu Shot  Completed   DEXA scan (bone density measurement)  Completed   Hepatitis C Screening: USPSTF Recommendation to screen - Ages 39-79 yo.  Completed   HPV Vaccine  Aged Out   Zoster (Shingles) Vaccine  Completed

## 2022-08-13 LAB — COMPREHENSIVE METABOLIC PANEL
ALT: 13 IU/L (ref 0–32)
AST: 19 IU/L (ref 0–40)
Albumin/Globulin Ratio: 1.6 (ref 1.2–2.2)
Albumin: 4.4 g/dL (ref 3.8–4.8)
Alkaline Phosphatase: 61 IU/L (ref 44–121)
BUN/Creatinine Ratio: 20 (ref 12–28)
BUN: 20 mg/dL (ref 8–27)
Bilirubin Total: 0.4 mg/dL (ref 0.0–1.2)
CO2: 22 mmol/L (ref 20–29)
Calcium: 10.7 mg/dL — ABNORMAL HIGH (ref 8.7–10.3)
Chloride: 105 mmol/L (ref 96–106)
Creatinine, Ser: 0.99 mg/dL (ref 0.57–1.00)
Globulin, Total: 2.8 g/dL (ref 1.5–4.5)
Glucose: 103 mg/dL — ABNORMAL HIGH (ref 70–99)
Potassium: 4.9 mmol/L (ref 3.5–5.2)
Sodium: 142 mmol/L (ref 134–144)
Total Protein: 7.2 g/dL (ref 6.0–8.5)
eGFR: 61 mL/min/{1.73_m2} (ref >59)

## 2022-08-13 LAB — CBC WITH DIFFERENTIAL/PLATELET
Basophils Absolute: 0.1 10*3/uL (ref 0.0–0.2)
Basos: 1 %
EOS (ABSOLUTE): 0.1 10*3/uL (ref 0.0–0.4)
Eos: 1 %
Hematocrit: 42 % (ref 34.0–46.6)
Hemoglobin: 13.9 g/dL (ref 11.1–15.9)
Immature Grans (Abs): 0 10*3/uL (ref 0.0–0.1)
Immature Granulocytes: 0 %
Lymphocytes Absolute: 3.1 10*3/uL (ref 0.7–3.1)
Lymphs: 41 %
MCH: 31 pg (ref 26.6–33.0)
MCHC: 33.1 g/dL (ref 31.5–35.7)
MCV: 94 fL (ref 79–97)
Monocytes Absolute: 0.6 10*3/uL (ref 0.1–0.9)
Monocytes: 7 %
Neutrophils Absolute: 3.8 10*3/uL (ref 1.4–7.0)
Neutrophils: 50 %
Platelets: 315 10*3/uL (ref 150–450)
RBC: 4.49 x10E6/uL (ref 3.77–5.28)
RDW: 12.7 % (ref 11.7–15.4)
WBC: 7.7 10*3/uL (ref 3.4–10.8)

## 2022-08-13 LAB — LIPID PANEL
Chol/HDL Ratio: 2.9 ratio (ref 0.0–4.4)
Cholesterol, Total: 233 mg/dL — ABNORMAL HIGH (ref 100–199)
HDL: 79 mg/dL (ref >39)
LDL Chol Calc (NIH): 137 mg/dL — ABNORMAL HIGH (ref 0–99)
Triglycerides: 100 mg/dL (ref 0–149)
VLDL Cholesterol Cal: 17 mg/dL (ref 5–40)

## 2022-08-18 ENCOUNTER — Encounter: Payer: Self-pay | Admitting: Nurse Practitioner

## 2022-09-07 ENCOUNTER — Other Ambulatory Visit (HOSPITAL_BASED_OUTPATIENT_CLINIC_OR_DEPARTMENT_OTHER): Payer: Self-pay | Admitting: Nurse Practitioner

## 2022-09-07 DIAGNOSIS — Z1231 Encounter for screening mammogram for malignant neoplasm of breast: Secondary | ICD-10-CM

## 2022-09-16 ENCOUNTER — Encounter (HOSPITAL_BASED_OUTPATIENT_CLINIC_OR_DEPARTMENT_OTHER): Payer: Medicare Other | Admitting: Radiology

## 2022-09-16 DIAGNOSIS — Z1231 Encounter for screening mammogram for malignant neoplasm of breast: Secondary | ICD-10-CM

## 2022-10-18 ENCOUNTER — Encounter (HOSPITAL_BASED_OUTPATIENT_CLINIC_OR_DEPARTMENT_OTHER): Payer: Self-pay | Admitting: Radiology

## 2022-10-18 ENCOUNTER — Ambulatory Visit (HOSPITAL_BASED_OUTPATIENT_CLINIC_OR_DEPARTMENT_OTHER)
Admission: RE | Admit: 2022-10-18 | Discharge: 2022-10-18 | Disposition: A | Discharge status: Home / Self Care | Payer: Medicare Other | Source: Ambulatory Visit | Attending: Nurse Practitioner | Admitting: Nurse Practitioner

## 2022-10-18 DIAGNOSIS — Z1231 Encounter for screening mammogram for malignant neoplasm of breast: Secondary | ICD-10-CM | POA: Diagnosis not present

## 2022-10-23 NOTE — Progress Notes (Unsigned)
Cardiology Office Note Date:  10/25/2022  Patient ID:  Lisa Moore, DOB 08-28-1951, MRN [id number] PCP:  Orma Render, NP  Cardiologist:  Kirk Ruths, MD Electrophysiologist: Vickie Epley, MD   Chief Complaint: 1 yr follow-up  History of Present Illness: Lisa Moore is a 72 y.o. female with PMH notable for afib s/p PCI and CTI ablation, ; seen today for Vickie Epley, MD for routine electrophysiology followup. Since last being seen in our clinic the patient reports doing very well.  Previous Dr. Rayann Heman patient, last saw Dr. Quentin Ore 10/2021. She underwent PVI and CTI ablation  01/2021. In discussion with Dr. Rayann Heman, Lisa Moore was stopped. At last visit with Dr. Quentin Ore, she had wanted to stop dilt. Of note, she had no cardiac awareness of Afib in past, was found during PCP appt.  Today, she denies AF symptoms such as palpitations. Has noticed a slow, steady decline in energy level.  She walks for exercise a couple times a week. Works in garden during warm weather months. Uses kardia about 1/mon to check heart rhythm. She has occasional GERD symptoms, and always checks Kardia during GERD episodes to confirm heart rhythm.    she denies chest pain, palpitations, dyspnea, PND, orthopnea, nausea, vomiting, dizziness, syncope, edema, weight gain, or early satiety.     Past Medical History:  Diagnosis Date   Arthritis    Benign hematuria    GERD (gastroesophageal reflux disease)    Medicare annual wellness visit, subsequent 08/12/2022   Medicare annual wellness visit, subsequent 08/12/2022   Migraines    rare   Myocardial infarction [facility]) 2022   Ablation 2022   Persistent atrial fibrillation ([place]) 05/21/2020   Typical atrial flutter ([place]) 11/13/2020    Past Surgical History:  Procedure Laterality Date   ABDOMINAL HYSTERECTOMY     APPENDECTOMY     ATRIAL FIBRILLATION ABLATION N/A 01/13/2021   Procedure: ATRIAL FIBRILLATION ABLATION;  Surgeon: Thompson Grayer, MD;  Location:  [facility] LAB;  Service: Cardiovascular;  Laterality: N/A;   CARDIOVERSION N/A 07/07/2020   Procedure: CARDIOVERSION;  Surgeon: Josue Hector, MD;  Location: [facility] ENDOSCOPY;  Service: Cardiovascular;  Laterality: N/A;   CARDIOVERSION N/A 11/07/2020   Procedure: CARDIOVERSION;  Surgeon: Josue Hector, MD;  Location: [facility] ENDOSCOPY;  Service: Cardiovascular;  Laterality: N/A;    Current Outpatient Medications  Medication Instructions   acetaminophen (TYLENOL) 1,000 mg, Oral, Every 6 hours PRN   Calcium Carbonate-Vit D-Min (CALCIUM 1200 PO) 1,200 mg, Oral, Daily   D3 2000 2,000 Units, Oral, Daily      Social History:  The patient  reports that she has never smoked. She has never used smokeless tobacco. She reports current alcohol use of about 1.0 standard drink of alcohol per week. She reports that she does not use drugs.   Family History:  The patient's family history includes COPD in her father; Cancer in her father, paternal grandmother, and sister; Diabetes in her father; Hypertension in her mother; Kidney disease in her maternal grandmother.  ROS:  Please see the history of present illness. All other systems are reviewed and otherwise negative.   PHYSICAL EXAM:  VS:  BP 110/80   Pulse 77   Ht 5' 5"$  (1.651 m)   Wt 206 lb 12.8 oz (93.8 kg)   LMP  (LMP Unknown)   SpO2 96%   BMI 34.41 kg/m  BMI: Body mass index is 34.41 kg/m.  GEN- The patient is well appearing, alert and oriented  x 3 today.   HEENT: normocephalic, atraumatic; sclera clear, conjunctiva pink; hearing intact; oropharynx clear; neck supple, no JVP Lungs- Clear to ausculation bilaterally, normal work of breathing.  No wheezes, rales, rhonchi Heart- Regular rate and rhythm, no murmurs, rubs or gallops, PMI not laterally displaced GI- soft, non-tender, non-distended, bowel sounds present, no hepatosplenomegaly Extremities- Trace peripheral edema. no clubbing or cyanosis; DP/PT/radial pulses 2+ bilaterally MS- no  significant deformity or atrophy Skin- warm and dry, no rash or lesion,  Psych- euthymic mood, full affect Neuro- strength and sensation are intact   EKG is ordered. Personal review of EKG from today shows:  NSR, rate 77bpm  Recent Labs: 08/12/2022: ALT 13; BUN 20; Creatinine, Ser 0.99; Hemoglobin 13.9; Platelets 315; Potassium 4.9; Sodium 142  08/12/2022: Chol/HDL Ratio 2.9; Cholesterol, Total 233; HDL 79; LDL Chol Calc (NIH) 137; Triglycerides 100   CrCl cannot be calculated (Patient's most recent lab result is older than the maximum 21 days allowed.).   Wt Readings from Last 3 Encounters:  10/25/22 206 lb 12.8 oz (93.8 kg)  08/12/22 207 lb 12.8 oz (94.3 kg)  10/20/21 197 lb 12.8 oz (89.7 kg)     Additional studies reviewed include: Previous EP, cardiology notes.     ASSESSMENT AND PLAN:  #) h/o afib, aflutter No burden currently Uses Kardia for monitoring [place] currently   Current medicines are reviewed at length with the patient today.   The patient does not have concerns regarding her medicines.  The following changes were made today:  none  Labs/ tests ordered today include:  No orders of the defined types were placed in this encounter.    Disposition: Follow up with Dr. Quentin Ore, EP APP, or AF clinic in in 12 months   Signed, Mamie Levers, NP  10/25/22  9:36 AM  Electrophysiology [facility]

## 2022-10-25 ENCOUNTER — Encounter: Payer: Self-pay | Admitting: Cardiology

## 2022-10-25 ENCOUNTER — Ambulatory Visit: Payer: Medicare Other | Attending: Physician Assistant | Admitting: Cardiology

## 2022-10-25 VITALS — BP 110/80 | HR 77 | Ht 65.0 in | Wt 206.8 lb

## 2022-10-25 DIAGNOSIS — Z8679 Personal history of other diseases of the circulatory system: Secondary | ICD-10-CM

## 2022-10-25 NOTE — Patient Instructions (Addendum)
Medication Instructions:   Your physician recommends that you continue on your current medications as directed. Please refer to the Current Medication list given to you today.   *If you need a refill on your cardiac medications before your next appointment, please call your pharmacy*   Lab Work: [place]    If you have labs (blood work) drawn today and your tests are completely normal, you will receive your results only by: [place] (if you have MyChart) OR A paper copy in the mail If you have any lab test that is abnormal or we need to change your treatment, we will call you to review the results.   Testing/Procedures: NONE ORDERED  TODAY    Follow-Up: At [facility], you and your health needs are our priority.  As part of our continuing mission to provide you with exceptional heart care, we have created designated Provider Care Teams.  These Care Teams include your primary Cardiologist (physician) and Advanced Practice Providers (APPs -  Physician Assistants and Nurse Practitioners) who all work together to provide you with the care you need, when you need it.  We recommend signing up for the patient portal called "MyChart".  Sign up information is provided on this After Visit Summary.  MyChart is used to connect with patients for Virtual Visits (Telemedicine).  Patients are able to view lab/test results, encounter notes, upcoming appointments, etc.  Non-urgent messages can be sent to your provider as well.   To learn more about what you can do with MyChart, go to [email or web address].    Your next appointment: WITH AFIB  CLINIC  FOR ANNUAL CHECK UP 1 year(s)  Provider:   You may see Vickie Epley, MD or one of the following Advanced Practice Providers on your designated Care Team:   Tommye Standard, [place] Beryle Beams" [place], [place], NP

## 2023-02-15 ENCOUNTER — Other Ambulatory Visit: Payer: Self-pay

## 2023-02-15 ENCOUNTER — Other Ambulatory Visit (HOSPITAL_BASED_OUTPATIENT_CLINIC_OR_DEPARTMENT_OTHER): Payer: Self-pay

## 2023-02-15 ENCOUNTER — Ambulatory Visit (INDEPENDENT_AMBULATORY_CARE_PROVIDER_SITE_OTHER): Payer: Medicare Other | Admitting: Orthopaedic Surgery

## 2023-02-15 DIAGNOSIS — M5441 Lumbago with sciatica, right side: Secondary | ICD-10-CM

## 2023-02-15 DIAGNOSIS — G8929 Other chronic pain: Secondary | ICD-10-CM | POA: Diagnosis not present

## 2023-02-15 MED ORDER — PREDNISONE 10 MG PO TABS
ORAL_TABLET | ORAL | 0 refills | Status: DC
  Filled 2023-02-15: qty 21, 6d supply, fill #0

## 2023-02-15 MED ORDER — METHOCARBAMOL 750 MG PO TABS
750.0000 mg | ORAL_TABLET | Freq: Two times a day (BID) | ORAL | 2 refills | Status: DC | PRN
  Filled 2023-02-15: qty 20, 10d supply, fill #0

## 2023-02-15 NOTE — Progress Notes (Signed)
Office Visit Note   Patient: Lisa Moore           Date of Birth: May 14, 1951           MRN: [id number] Visit Date: 02/15/2023              Requested by: Tollie Eth, NP [address] [place],  [place] [address] PCP: Tollie Eth, NP   Assessment & Plan: Visit Diagnoses:  1. Chronic right-sided low back pain with right-sided sciatica     Plan: Impression is chronic bilateral low back pain and bilateral lower extremity radiculopathy worse with worsening of symptoms now including weakness and paresthesias to her feet.  The patient has tried steroid packs, muscle relaxers as well as physical therapy without relief.  I would like to order an MRI of the lumbar spine to assess for structural abnormalities.  She will follow-up with Ellin Goodie to review the MRI and discuss further treatment options.  I have called in a steroid pack and muscle relaxer to take for now.  Call with concerns or questions in meantime.  Follow-Up Instructions: No follow-ups on file.   Orders:  No orders of the defined types were placed in this encounter.  No orders of the defined types were placed in this encounter.     Procedures: No procedures performed   Clinical Data: No additional findings.   Subjective: Chief Complaint  Patient presents with   Lower Back - Pain    HPI patient is a pleasant 72 year old female who comes in today with bilateral low back pain and bilateral lower extremity right greater than left.  Symptoms have been ongoing for years.  Typically in the past, symptoms are somewhat alleviated by steroids and muscle relaxers.  She is also undergone 2 ESI's by Dr. Alvester Morin which have been more of a long-lasting relief.  Her current symptoms worsened about 2 months ago after she was gardening.  Pain is worse with lumbar flexion as well as when she turns in the bed in addition to gardening.  She has been taking Tylenol without significant relief.  She does note numbness to both  lower extremities and into the feet.  She is starting to get weakness in her legs.  No bowel or bladder change or saddle paresthesias.  She has been in physical therapy for several months without any improvement.  Review of Systems as detailed in HPI.  All others reviewed and are negative.   Objective: Vital Signs: LMP  (LMP Unknown)   Physical Exam well-developed well-nourished female no acute distress.  Alert and oriented x 3.  Ortho Exam lumbar spine exam: Increased pain with lumbar flexion and rotation.  Negative straight leg raise both sides.  She does have 4 out of 5 strength with resisted knee flexion and extension both sides.  She is able to dorsiflex plantarflex both feet.  She is neurovascular intact distally.  Specialty Comments:  No specialty comments available.  Imaging: No new imaging   PMFS History: Patient Active Problem List   Diagnosis Date Noted   Medicare annual wellness visit, subsequent 08/12/2022   History of atrial fibrillation 09/25/2021   Heart disease, unspecified 09/25/2021   Balance problems 05/21/2020   Past Medical History:  Diagnosis Date   Arthritis    Benign hematuria    GERD (gastroesophageal reflux disease)    Medicare annual wellness visit, subsequent 08/12/2022   Medicare annual wellness visit, subsequent 08/12/2022   Migraines    rare  Myocardial infarction [facility]) 2022   Ablation 2022   Persistent atrial fibrillation ([facility]) 05/21/2020   Typical atrial flutter ([facility]) 11/13/2020    Family History  Problem Relation Age of Onset   Hypertension Mother    Cancer Father    COPD Father    Diabetes Father    Cancer Sister    Kidney disease Maternal Grandmother    Cancer Paternal Grandmother     Past Surgical History:  Procedure Laterality Date   ABDOMINAL HYSTERECTOMY     APPENDECTOMY     ATRIAL FIBRILLATION ABLATION N/A 01/13/2021   Procedure: ATRIAL FIBRILLATION ABLATION;  Surgeon: Hillis Range, MD;  [facility];   Service: Cardiovascular;  Laterality: N/A;   CARDIOVERSION N/A 07/07/2020   Procedure: CARDIOVERSION;  Surgeon: Wendall Stade, MD;  Location: [facility] ENDOSCOPY;  Service: Cardiovascular;  Laterality: N/A;   CARDIOVERSION N/A 11/07/2020   Procedure: CARDIOVERSION;  Surgeon: Wendall Stade, MD;  Location: [facility] ENDOSCOPY;  Service: Cardiovascular;  Laterality: N/A;   Social History   Occupational History   Not on file  Tobacco Use   Smoking status: Never   Smokeless tobacco: Never  Vaping Use   Vaping Use: Never used  Substance and Sexual Activity   Alcohol use: Yes    Alcohol/week: 1.0 standard drink of alcohol    Types: 1 Shots of liquor per week    Comment: occasional beer few times a month, not regularly   Drug use: No   Sexual activity: Not Currently

## 2023-02-17 ENCOUNTER — Ambulatory Visit
Admission: RE | Admit: 2023-02-17 | Discharge: 2023-02-17 | Disposition: A | Discharge status: Home / Self Care | Payer: Medicare Other | Source: Ambulatory Visit | Attending: Orthopaedic Surgery | Admitting: Orthopaedic Surgery

## 2023-02-17 DIAGNOSIS — G8929 Other chronic pain: Secondary | ICD-10-CM

## 2023-02-17 DIAGNOSIS — M48061 Spinal stenosis, lumbar region without neurogenic claudication: Secondary | ICD-10-CM | POA: Diagnosis not present

## 2023-02-17 DIAGNOSIS — M47816 Spondylosis without myelopathy or radiculopathy, lumbar region: Secondary | ICD-10-CM | POA: Diagnosis not present

## 2023-02-22 ENCOUNTER — Other Ambulatory Visit: Payer: Self-pay | Admitting: Physician Assistant

## 2023-02-22 ENCOUNTER — Telehealth: Payer: Self-pay | Admitting: Physical Medicine and Rehabilitation

## 2023-02-22 NOTE — Telephone Encounter (Signed)
Patient called. She would like an appointment with [facility]. 6/19 6/20 are the dates she is available.

## 2023-02-23 ENCOUNTER — Other Ambulatory Visit (HOSPITAL_BASED_OUTPATIENT_CLINIC_OR_DEPARTMENT_OTHER): Payer: Self-pay

## 2023-02-23 NOTE — Telephone Encounter (Signed)
Spoke with patient and scheduled OV for 03/02/23.

## 2023-03-02 ENCOUNTER — Other Ambulatory Visit: Payer: Self-pay

## 2023-03-02 ENCOUNTER — Encounter: Payer: Self-pay | Admitting: Physical Medicine and Rehabilitation

## 2023-03-02 ENCOUNTER — Ambulatory Visit (INDEPENDENT_AMBULATORY_CARE_PROVIDER_SITE_OTHER): Payer: Medicare Other | Admitting: Physical Medicine and Rehabilitation

## 2023-03-02 DIAGNOSIS — G8929 Other chronic pain: Secondary | ICD-10-CM | POA: Diagnosis not present

## 2023-03-02 DIAGNOSIS — M5416 Radiculopathy, lumbar region: Secondary | ICD-10-CM

## 2023-03-02 DIAGNOSIS — M5441 Lumbago with sciatica, right side: Secondary | ICD-10-CM

## 2023-03-02 DIAGNOSIS — M47816 Spondylosis without myelopathy or radiculopathy, lumbar region: Secondary | ICD-10-CM | POA: Diagnosis not present

## 2023-03-02 DIAGNOSIS — M5442 Lumbago with sciatica, left side: Secondary | ICD-10-CM | POA: Diagnosis not present

## 2023-03-02 MED ORDER — METHYLPREDNISOLONE ACETATE 80 MG/ML IJ SUSP
80.0000 mg | Freq: Once | INTRAMUSCULAR | Status: AC
  Administered 2023-03-02: 80 mg

## 2023-03-02 NOTE — Procedures (Signed)
Lumbar Epidural Steroid Injection - Interlaminar Approach with Fluoroscopic Guidance  Patient: Lisa Moore      Date of Birth: 1950-10-10 MRN: [id number] PCP: Tollie Eth, NP      Visit Date: 03/02/2023   Universal Protocol:     Consent Given By: the patient  Position: PRONE  Additional Comments: Vital signs were monitored before and after the procedure. Patient was prepped and draped in the usual sterile fashion. The correct patient, procedure, and site was verified.   Injection Procedure Details:   Procedure diagnoses: Lumbar radiculopathy [M54.16]   Meds Administered:  Meds ordered this encounter  Medications   methylPREDNISolone acetate (DEPO-MEDROL) injection 80 mg     Laterality: Right  Location/Site:  L4-5  Needle: 3.5 in., 20 ga. Tuohy  Needle Placement: Paramedian epidural  Findings:   -Comments: Excellent flow of contrast into the epidural space.  Procedure Details: Using a paramedian approach from the side mentioned above, the region overlying the inferior lamina was localized under fluoroscopic visualization and the soft tissues overlying this structure were infiltrated with 4 ml. of 1% Lidocaine without Epinephrine. The Tuohy needle was inserted into the epidural space using a paramedian approach.   The epidural space was localized using loss of resistance along with counter oblique bi-planar fluoroscopic views.  After negative aspirate for air, blood, and CSF, a 2 ml. volume of Isovue-250 was injected into the epidural space and the flow of contrast was observed. Radiographs were obtained for documentation purposes.    The injectate was administered into the level noted above.   Additional Comments:  No complications occurred Dressing: 2 x 2 sterile gauze and Band-Aid    Post-procedure details: Patient was observed during the procedure. Post-procedure instructions were reviewed.  Patient left the clinic in stable condition.

## 2023-03-02 NOTE — Progress Notes (Signed)
Lisa Moore - 72 y.o. female MRN [id number]  Date of birth: [birth year]  Office Visit Note: Visit Date: 03/02/2023 PCP: Tollie Eth, NP Referred by: Tollie Eth, NP  Subjective: Chief Complaint  Patient presents with   Lower Back - Pain   HPI: Lisa Moore is a 72 y.o. female who comes in today per the request of Dr. Glee Arvin for evaluation of chronic, worsening and severe bilateral lower back pain radiating to buttocks and hips, intermittent pain radiating to lateral aspect of legs, right greater than left. Also reports muscle spasms to both legs and numbness/tingling to toes. Pain worsens with movement and activity, she describes as constant aching sensation, currently rates as 7 out of 10. Some relief of pain with home exercise regimen, rest and use of medications. History of formal physical therapy for more balance issues. Recent lumbar MRI imaging exhibits moderate facet disease, flattening of the ventral thecal sac and mild bilateral lateral recess stenosis at L4-L5. Prior disc protrusion at this level has resolved from prior imaging. Patient underwent right L4-L5 interlaminar epidural steroid injection in our office in 2012, repeat same injection with [facility] in 2013. She reports significant and sustained relief of pain for greater than 6 months with these procedures. Patient states she is very active, enjoys gardening and yard work. She reports recent fall about 2 weeks ago while working in the yard, states she lost her balance and fell forward, denies injuries. She does ambulate with cane. Patient denies focal weakness.    Review of Systems  Musculoskeletal:  Positive for back pain.  Neurological:  Positive for tingling. Negative for focal weakness and weakness.  All other systems reviewed and are negative.  Otherwise per HPI.  Assessment & Plan: Visit Diagnoses:    ICD-10-CM   1. Lumbar radiculopathy  M54.16 Ambulatory referral to Physical Medicine Rehab    XR C-ARM  NO REPORT    Epidural Steroid injection    methylPREDNISolone acetate (DEPO-MEDROL) injection 80 mg    2. Chronic bilateral low back pain with bilateral sciatica  M54.42    M54.41    G89.29     3. Facet arthropathy, lumbar  M47.816        Plan: Findings:  Chronic, worsening and severe bilateral lower back pain radiating to buttocks and hips, intermittent radiation of pain to lateral legs. Patient continues to have severe pain despite good conservative therapies such as formal physical therapy, home exercise regimen, rest and use of medications. Patient was under the impression she would be receiving injection today, states her family drove several hours to accompany her. We explained to patient that she was scheduled for office visit to review recent lumbar MRI imaging and discuss treatment options. She has not been seen in our office since 2012. Dr. Alvester Morin kindly spoke with patient today and will work her in for injection. Order placed for right L4-L5 interlaminar epidural steroid injection under fluoroscopic guidance. Her medication list does show she is taking Eliquis, however she reports she is not currently taking due to high cost. We were able to complete injection today without difficulty. If good relief of pain with injection we can repeat infrequently as needed. Patient instructed to let us know if her pain persists. No red flag symptoms noted upon exam today.     Meds & Orders:  Meds ordered this encounter  Medications   methylPREDNISolone acetate (DEPO-MEDROL) injection 80 mg    Orders Placed This Encounter  Procedures  XR C-ARM NO REPORT   Ambulatory referral to Physical Medicine Rehab   Epidural Steroid injection    Follow-up: Return if symptoms worsen or fail to improve.   Procedures: No procedures performed  Lumbar Epidural Steroid Injection - Interlaminar Approach with Fluoroscopic Guidance  Patient: Lisa Moore      Date of Birth: [birth year] MRN: [id number] PCP:  Tollie Eth, NP      Visit Date: 03/02/2023   Universal Protocol:     Consent Given By: the patient  Position: PRONE  Additional Comments: Vital signs were monitored before and after the procedure. Patient was prepped and draped in the usual sterile fashion. The correct patient, procedure, and site was verified.   Injection Procedure Details:   Procedure diagnoses: Lumbar radiculopathy [M54.16]   Meds Administered:  Meds ordered this encounter  Medications   methylPREDNISolone acetate (DEPO-MEDROL) injection 80 mg     Laterality: Right  Location/Site:  L4-5  Needle: 3.5 in., 20 ga. Tuohy  Needle Placement: Paramedian epidural  Findings:   -Comments: Excellent flow of contrast into the epidural space.  Procedure Details: Using a paramedian approach from the side mentioned above, the region overlying the inferior lamina was localized under fluoroscopic visualization and the soft tissues overlying this structure were infiltrated with 4 ml. of 1% Lidocaine without Epinephrine. The Tuohy needle was inserted into the epidural space using a paramedian approach.   The epidural space was localized using loss of resistance along with counter oblique bi-planar fluoroscopic views.  After negative aspirate for air, blood, and CSF, a 2 ml. volume of Isovue-250 was injected into the epidural space and the flow of contrast was observed. Radiographs were obtained for documentation purposes.    The injectate was administered into the level noted above.   Additional Comments:  No complications occurred Dressing: 2 x 2 sterile gauze and Band-Aid    Post-procedure details: Patient was observed during the procedure. Post-procedure instructions were reviewed.  Patient left the clinic in stable condition.   Clinical History: EXAM: MRI LUMBAR SPINE WITHOUT CONTRAST   TECHNIQUE: Multiplanar, multisequence MR imaging of the lumbar spine was performed. No intravenous contrast was  administered.   COMPARISON:  Lumbar radiographs 01/27/2022   FINDINGS: Segmentation: There are five lumbar type vertebral bodies. The last full intervertebral disc space is labeled L5-S1.   Alignment:  Mild degenerative anterolisthesis of L4.   Vertebrae: Normal marrow signal. No bone lesions or fractures. Moderate endplate reactive changes at L4-5.   Conus medullaris and cauda equina: Conus extends to the L1 level. Conus and cauda equina appear normal.   Paraspinal and other soft tissues: No significant paraspinal retroperitoneal findings. Stable 6 cm septated cyst associated with the left kidney when compared to a prior MRI of the abdomen from 2018. This is considered a Bosniak 2 lesion. No further imaging evaluation or is necessary. Stable 16 mm left renal angiomyolipoma any further imaging evaluation or follow-up.   Disc levels:   T12-L1: No significant findings.   L1-2: Degenerative disc disease with disc space narrowing, bulging annulus and osteophytic ridging. The spinal canal is fairly generous in is no significant spinal or foraminal stenosis. Mild bilateral lateral recess encroachment, right greater than.   L2-3: Mild disc space narrowing with bulging annulus osteophytic ridging with mild bilateral lateral recess encroachment. No significant spinal or stenosis.   L3-4: Bulging annulus, osteophytic ridging and facet disease contributing to early spinal stenosis and mild bilateral lateral recess stenosis. No foraminal stenosis.  L4-5: Bulging uncovered disc and disc desiccation and degeneration. Moderate facet disease is also noted and there is flattening of the ventral thecal sac and mild bilateral lateral recess stenosis no significant foraminal stenosis.   L5-S1: Moderate facet disease but no disc protrusions, spinal or foraminal stenosis.   IMPRESSION: 1. Degenerative lumbar spondylosis with multilevel disc disease and facet disease. 2. Mild bilateral  lateral recess encroachment at L1-2 and L2-3. 3. Early spinal stenosis and mild bilateral lateral recess stenosis at L3-4. 4. Mild bilateral lateral recess stenosis at L4-5.     Electronically Signed   By: Rudie Meyer M.D.   On: 02/26/2023 20:53   She reports that she has never smoked. She has never used smokeless tobacco. No results for input(s): "HGBA1C", "LABURIC" in the last 8760 hours.  Objective:  VS:  HT:    WT:   BMI:     BP:   HR: bpm  TEMP: ( )  RESP:  Physical Exam Vitals and nursing note reviewed.  HENT:     Head: Normocephalic and atraumatic.     Right Ear: External ear normal.     Left Ear: External ear normal.     Nose: Nose normal.     Mouth/Throat:     Mouth: Mucous membranes are moist.  Eyes:     Extraocular Movements: Extraocular movements intact.  Cardiovascular:     Rate and Rhythm: Normal rate.     Pulses: Normal pulses.  Pulmonary:     Effort: Pulmonary effort is normal.  Abdominal:     General: Abdomen is flat. There is no distension.  Musculoskeletal:        General: Tenderness present.     Cervical back: Normal range of motion.     Comments: Patient rises from seated position to standing without difficulty. Good lumbar range of motion. No pain noted with facet loading. 5/5 strength noted with bilateral hip flexion, knee flexion/extension, ankle dorsiflexion/plantarflexion and EHL. No clonus noted bilaterally. No pain upon palpation of greater trochanters. No pain with internal/external rotation of bilateral hips. Sensation intact bilaterally. Dysesthesias noted to bilateral L5 dermatome. Negative slump test bilaterally. Ambulates with cane, gait unsteady.   Skin:    General: Skin is warm and dry.     Capillary Refill: Capillary refill takes less than 2 seconds.  Neurological:     General: No focal deficit present.     Mental Status: She is alert and oriented to person, place, and time.  Psychiatric:        Mood and Affect: Mood normal.         Behavior: Behavior normal.     Ortho Exam  Imaging: No results found.  Past Medical/Family/Surgical/Social History: Medications & Allergies reviewed per EMR, new medications updated. Patient Active Problem List   Diagnosis Date Noted   Medicare annual wellness visit, subsequent 08/12/2022   History of atrial fibrillation 09/25/2021   Heart disease, unspecified 09/25/2021   Balance problems 05/21/2020   Past Medical History:  Diagnosis Date   Arthritis    Benign hematuria    GERD (gastroesophageal reflux disease)    Medicare annual wellness visit, subsequent 08/12/2022   Medicare annual wellness visit, subsequent 08/12/2022   Migraines    rare   Myocardial infarction [facility]) 2022   Ablation 2022   Persistent atrial fibrillation (HCC) 05/21/2020   Typical atrial flutter (HCC) 11/13/2020   Family History  Problem Relation Age of Onset   Hypertension Mother    Cancer Father  COPD Father    Diabetes Father    Cancer Sister    Kidney disease Maternal Grandmother    Cancer Paternal Grandmother    Past Surgical History:  Procedure Laterality Date   ABDOMINAL HYSTERECTOMY     APPENDECTOMY     ATRIAL FIBRILLATION ABLATION N/A 01/13/2021   Procedure: ATRIAL FIBRILLATION ABLATION;  Surgeon: Hillis Range, MD;  Location: [facility] INVASIVE CV LAB;  Service: Cardiovascular;  Laterality: N/A;   CARDIOVERSION N/A 07/07/2020   Procedure: CARDIOVERSION;  Surgeon: Wendall Stade, MD;  Location: [facility] ENDOSCOPY;  Service: Cardiovascular;  Laterality: N/A;   CARDIOVERSION N/A 11/07/2020   Procedure: CARDIOVERSION;  Surgeon: Wendall Stade, MD;  Location: [facility] ENDOSCOPY;  Service: Cardiovascular;  Laterality: N/A;   Social History   Occupational History   Not on file  Tobacco Use   Smoking status: Never   Smokeless tobacco: Never  Vaping Use   Vaping Use: Never used  Substance and Sexual Activity   Alcohol use: Yes    Alcohol/week: 1.0 standard drink of alcohol    Types: 1 Shots of  liquor per week    Comment: occasional beer few times a month, not regularly   Drug use: No   Sexual activity: Not Currently

## 2023-03-07 ENCOUNTER — Other Ambulatory Visit (HOSPITAL_BASED_OUTPATIENT_CLINIC_OR_DEPARTMENT_OTHER): Payer: Self-pay

## 2023-03-21 ENCOUNTER — Other Ambulatory Visit (HOSPITAL_BASED_OUTPATIENT_CLINIC_OR_DEPARTMENT_OTHER): Payer: Self-pay

## 2023-03-21 ENCOUNTER — Telehealth (HOSPITAL_COMMUNITY): Payer: Self-pay | Admitting: *Deleted

## 2023-03-21 MED ORDER — DILTIAZEM HCL ER COATED BEADS 180 MG PO CP24
180.0000 mg | ORAL_CAPSULE | Freq: Every day | ORAL | 3 refills | Status: DC
  Filled 2023-03-21: qty 30, 30d supply, fill #0
  Filled 2023-04-19: qty 30, 30d supply, fill #1
  Filled 2023-05-19: qty 30, 30d supply, fill #2
  Filled 2023-06-20: qty 30, 30d supply, fill #3

## 2023-03-21 MED ORDER — DILTIAZEM HCL ER COATED BEADS 120 MG PO CP24
120.0000 mg | ORAL_CAPSULE | Freq: Every day | ORAL | 3 refills | Status: DC
  Filled 2023-03-21: qty 30, 30d supply, fill #0

## 2023-03-21 MED ORDER — APIXABAN 5 MG PO TABS
5.0000 mg | ORAL_TABLET | Freq: Two times a day (BID) | ORAL | 3 refills | Status: DC
  Filled 2023-03-21: qty 60, 30d supply, fill #0

## 2023-03-21 NOTE — Telephone Encounter (Signed)
Pt called in stating she went back into afib she believes this afternoon after waking up from a nap. Her Heart rates according to her Lourena Simmonds were in the 170s. She tried to calm herself down and heart rates currently are 140. She has felt more fatigued the last several weeks but attributed it to back pain which she recently received a back injection for. She is currently on no medications post ablation.  Discussed with Jorja Loa PA will start Eliquis 5mg  twice a day and Cardizem 180mg  once a day that she was previously on. Pt to call with update of HR in 1-2 days for response. IF still in afib at that point will plan to bring in for assessment. ER precautions were reviewed with pt.

## 2023-03-22 ENCOUNTER — Telehealth: Payer: Self-pay | Admitting: Cardiology

## 2023-03-22 ENCOUNTER — Other Ambulatory Visit (HOSPITAL_BASED_OUTPATIENT_CLINIC_OR_DEPARTMENT_OTHER): Payer: Self-pay

## 2023-03-22 ENCOUNTER — Other Ambulatory Visit: Payer: Self-pay

## 2023-03-22 NOTE — Telephone Encounter (Signed)
Left message for patient to call back  

## 2023-03-22 NOTE — Telephone Encounter (Signed)
Patient c/o Palpitations:  High priority if patient c/o lightheadedness, shortness of breath, or chest pain  How long have you had palpitations/irregular HR/ Afib? Are you having the symptoms now? Been in Afib since yesterday  Are you currently experiencing lightheadedness, SOB or CP? No, just the fact that she can actually feel the fluttering of the heart, even in to her arms they feel weak.   Do you have a history of afib (atrial fibrillation) or irregular heart rhythm? Afib  Have you checked your BP or HR? (document readings if available): 121/110 yesterday and HR was 146 but it was at 178 yesterday morning.  Are you experiencing any other symptoms? HR was at 178 yesterday and heart was beating so fast she woke up out of her sleep

## 2023-03-23 ENCOUNTER — Other Ambulatory Visit (HOSPITAL_BASED_OUTPATIENT_CLINIC_OR_DEPARTMENT_OTHER): Payer: Self-pay

## 2023-03-23 ENCOUNTER — Telehealth: Payer: Self-pay | Admitting: Cardiology

## 2023-03-23 NOTE — Telephone Encounter (Signed)
Spoke with patient regarding below. Pt heart rates are currently controlled will plan to see on Friday and will address affordability of DOAC at that visit. Pt has enough eliquis until seen Friday.

## 2023-03-23 NOTE — Telephone Encounter (Signed)
Pt returning call

## 2023-03-23 NOTE — Telephone Encounter (Signed)
  Per MyChart Scheduling message:  Initial complaint: Currently going in and out of AFIB    Patient c/o Palpitations:  High priority if patient c/o lightheadedness, shortness of breath, or chest pain  How long have you had palpitations/irregular HR/ Afib? Are you having the symptoms now?   Are you currently experiencing lightheadedness, SOB or CP?   Do you have a history of afib (atrial fibrillation) or irregular heart rhythm?   Have you checked your BP or HR? (document readings if available):   Are you experiencing any other symptoms?      AFIB symptoms began Monday 07/08. Highest BPM 178. BP checked at [facility] Monday 123/110.  Had an ablation May of '22. Cardia device indicating range of 140s and down to 90. AFIB clinic called in Eliquis and Diltiazem 180. Had enough Eliquis remaining until Thursday, did not refill due to $548 copay. Have an appointment at AFIB clinic Friday. Will request blood thinner other than Eliquis /less expensive alternative. Had a lumbar epidural steroid injection on June 19, perhaps reason for AFIB symptoms.

## 2023-03-23 NOTE — Telephone Encounter (Signed)
Appt made for Friday 2pm in AF Clinic

## 2023-03-25 ENCOUNTER — Ambulatory Visit (HOSPITAL_COMMUNITY)
Admission: RE | Admit: 2023-03-25 | Discharge: 2023-03-25 | Disposition: A | Discharge status: Home / Self Care | Payer: Medicare Other | Source: Ambulatory Visit | Attending: Physician Assistant | Admitting: Physician Assistant

## 2023-03-25 ENCOUNTER — Encounter (HOSPITAL_COMMUNITY): Payer: Self-pay | Admitting: Physician Assistant

## 2023-03-25 VITALS — BP 122/76 | HR 67 | Ht 65.0 in | Wt 205.0 lb

## 2023-03-25 DIAGNOSIS — I4892 Unspecified atrial flutter: Secondary | ICD-10-CM | POA: Insufficient documentation

## 2023-03-25 DIAGNOSIS — I4819 Other persistent atrial fibrillation: Secondary | ICD-10-CM | POA: Insufficient documentation

## 2023-03-25 DIAGNOSIS — Z6834 Body mass index (BMI) 34.0-34.9, adult: Secondary | ICD-10-CM | POA: Insufficient documentation

## 2023-03-25 DIAGNOSIS — E669 Obesity, unspecified: Secondary | ICD-10-CM | POA: Insufficient documentation

## 2023-03-25 NOTE — Progress Notes (Signed)
Primary Care Physician: Tollie Eth, NP Primary Cardiologist: Dr Jens Som  Primary Electrophysiologist: Dr Johney Frame Referring Physician: Kriste Basque Dunn/Dr Dillard Cannon Lisa Moore is a 72 y.o. female with a history of persistent atrial fibrillation who presents for follow up in the [facility]. The patient was initially diagnosed with atrial fibrillation 05/20/20 after presenting to her PCP office with migraine symptoms. She was found incidentally to be in afib with RVR and was sent to the ED. She was admitted and started on Eliquis for a CHADS2VASC score of 2 and diltiazem for rate control. She was asymptomatic with her arrhythmia. Echo done during her admission showed normal LVEF, moderate LA dilation, degenerative MV with mild MR. Patient is s/p DCCV on 07/07/20. Patient is s/p DCCV on 11/07/20 after starting flecainide but had atrial flutter on follow up. She is s/p afib and flutter ablation with Dr Johney Frame on 01/13/21.   Patient called the clinic 7/8 stating that she had gone back into afib with RVR. Confirmed on her Kardia mobile. Eliquis and diltiazem were resumed.   On follow up today, patient reports that she converted to SR on 7/9, Kardia strips personally reviewed today. She does report that she had a steroid injection and was on oral steroids prior to her episode.   Today, she denies symptoms of palpitations, chest pain, shortness of breath, orthopnea, PND, lower extremity edema, dizziness, presyncope, syncope, bleeding, or neurologic sequela. The patient is tolerating medications without difficulties and is otherwise without complaint today.    Atrial Fibrillation Risk Factors:  she does not have symptoms or diagnosis of sleep apnea. Sleep study 11/30/20 negative.  she does not have a history of rheumatic fever. she does not have a history of alcohol use. The patient does not have a history of early familial atrial fibrillation or other arrhythmias.   Atrial  Fibrillation Management history:  Previous antiarrhythmic drugs: flecainide Previous cardioversions: 07/07/20, 11/07/20 Previous ablations: 01/13/21 Anticoagulation history: Eliquis   Past Medical History:  Diagnosis Date   Arthritis    Benign hematuria    GERD (gastroesophageal reflux disease)    Medicare annual wellness visit, subsequent 08/12/2022   Medicare annual wellness visit, subsequent 08/12/2022   Migraines    rare   Myocardial infarction [facility]) 2022   Ablation 2022   Persistent atrial fibrillation (HCC) 05/21/2020   Typical atrial flutter (HCC) 11/13/2020   ROS- All systems are reviewed and negative except as per the HPI above.  Physical Exam: Vitals:   03/25/23 1357  BP: 122/76  Weight: 93 kg  Height: 5\' 5"  (1.651 m)     GEN: Well nourished, well developed in no acute distress NECK: No JVD; No carotid bruits CARDIAC: Regular rate and rhythm, no murmurs, rubs, gallops RESPIRATORY:  Clear to auscultation without rales, wheezing or rhonchi  ABDOMEN: Soft, non-tender, non-distended EXTREMITIES:  No edema; No deformity    Wt Readings from Last 3 Encounters:  03/25/23 93 kg  10/25/22 93.8 kg  08/12/22 94.3 kg    EKG today demonstrates SR Vent. rate 67 BPM PR interval 180 ms QRS duration 76 ms QT/QTcB 396/418 ms  Echo 05/21/20 demonstrated  1. Left ventricular ejection fraction, by estimation, is 55 to 60%. The  left ventricle has normal function. The left ventricle has no regional  wall motion abnormalities. Left ventricular diastolic parameters are  indeterminate.   2. Right ventricular systolic function is normal. The right ventricular  size is normal.   3. Left  atrial size was moderately dilated.   4. The mitral valve is degenerative. Mild mitral valve regurgitation. No  evidence of mitral stenosis. Moderate mitral annular calcification.   5. The aortic valve is tricuspid. Aortic valve regurgitation is not  visualized. No aortic stenosis is present.    6. The inferior vena cava is normal in size with greater than 50%  respiratory variability, suggesting right atrial pressure of 3 mmHg.   Epic records are reviewed at length today  CHA2DS2-VASc Score = 2  The patient's score is based upon: CHF History: 0 HTN History: 0 Diabetes History: 0 Stroke History: 0 Vascular Disease History: 0 Age Score: 1 Gender Score: 1     ASSESSMENT AND PLAN: Persistent Atrial Fibrillation/atrial flutter The patient's CHA2DS2-VASc score is 2, indicating a 2.2% annual risk of stroke.   S/p afib and flutter ablation 01/13/21 She has spontaneously converted to SR. Given her episode was < 48 hours, will discontinue Eliquis with low CV score.  Continue diltiazem 180 mg daily Kardia mobile for home monitoring   Obesity Body mass index is 34.11 kg/m.  Encouraged lifestyle modification   Follow up in the AF clinic in 6 months.    Jorja Loa PA-C Afib Clinic [facility] [address] [place], [place] [address] [phone] 03/25/2023 2:13 PM

## 2023-05-06 ENCOUNTER — Other Ambulatory Visit (HOSPITAL_COMMUNITY): Payer: Self-pay

## 2023-05-06 MED ORDER — APIXABAN 5 MG PO TABS: 5.0000 mg | ORAL_TABLET | Freq: Two times a day (BID) | ORAL | Status: DC

## 2023-06-08 ENCOUNTER — Other Ambulatory Visit (HOSPITAL_BASED_OUTPATIENT_CLINIC_OR_DEPARTMENT_OTHER): Payer: Self-pay

## 2023-06-08 DIAGNOSIS — Z23 Encounter for immunization: Secondary | ICD-10-CM | POA: Diagnosis not present

## 2023-06-08 MED ORDER — COVID-19 MRNA VAC-TRIS(PFIZER) 30 MCG/0.3ML IM SUSY
0.3000 mL | PREFILLED_SYRINGE | Freq: Once | INTRAMUSCULAR | 0 refills | Status: AC
  Filled 2023-06-08: qty 0.3, 1d supply, fill #0

## 2023-06-08 MED ORDER — INFLUENZA VAC A&B SURF ANT ADJ 0.5 ML IM SUSY
0.5000 mL | PREFILLED_SYRINGE | Freq: Once | INTRAMUSCULAR | 0 refills | Status: AC
  Filled 2023-06-08: qty 0.5, 1d supply, fill #0

## 2023-07-14 ENCOUNTER — Other Ambulatory Visit (HOSPITAL_BASED_OUTPATIENT_CLINIC_OR_DEPARTMENT_OTHER): Payer: Self-pay | Admitting: Nurse Practitioner

## 2023-07-14 ENCOUNTER — Other Ambulatory Visit (HOSPITAL_COMMUNITY): Payer: Self-pay | Admitting: Physician Assistant

## 2023-07-14 ENCOUNTER — Other Ambulatory Visit (HOSPITAL_BASED_OUTPATIENT_CLINIC_OR_DEPARTMENT_OTHER): Payer: Self-pay

## 2023-07-14 DIAGNOSIS — Z1231 Encounter for screening mammogram for malignant neoplasm of breast: Secondary | ICD-10-CM

## 2023-07-14 MED ORDER — DILTIAZEM HCL ER COATED BEADS 180 MG PO CP24
180.0000 mg | ORAL_CAPSULE | Freq: Every day | ORAL | 3 refills | Status: DC
  Filled ????-??-??: fill #0

## 2023-07-21 ENCOUNTER — Other Ambulatory Visit (HOSPITAL_BASED_OUTPATIENT_CLINIC_OR_DEPARTMENT_OTHER): Payer: Self-pay

## 2023-07-21 ENCOUNTER — Telehealth (HOSPITAL_COMMUNITY): Payer: Self-pay | Admitting: *Deleted

## 2023-07-21 MED ORDER — DILTIAZEM HCL 30 MG PO TABS
ORAL_TABLET | ORAL | 1 refills | Status: DC
  Filled 2023-07-21: qty 30, 5d supply, fill #0
  Filled 2023-08-08: qty 30, 5d supply, fill #1

## 2023-07-21 MED ORDER — DILTIAZEM HCL ER COATED BEADS 240 MG PO CP24
240.0000 mg | ORAL_CAPSULE | Freq: Every day | ORAL | 3 refills | Status: DC
  Filled 2023-07-21: qty 30, 30d supply, fill #0
  Filled 2023-08-15: qty 30, 30d supply, fill #1
  Filled 2023-09-13: qty 30, 30d supply, fill #2
  Filled 2023-09-26 – 2023-10-19 (×2): qty 30, 30d supply, fill #3

## 2023-07-21 NOTE — Telephone Encounter (Signed)
Patient called in stating she went back into afib 3 days ago and heart rates are staying 140-160s. Discussed with Jorja Loa PA will start Eliquis 5mg  BID increase cardizem to 240mg  daily and PRN cardizem for rate control. Appt made. ER precautions reviewed with patient as well.

## 2023-07-22 ENCOUNTER — Other Ambulatory Visit (HOSPITAL_COMMUNITY): Payer: Self-pay

## 2023-07-22 MED ORDER — APIXABAN 5 MG PO TABS: 5.0000 mg | ORAL_TABLET | Freq: Two times a day (BID) | ORAL | Status: DC

## 2023-07-25 ENCOUNTER — Other Ambulatory Visit (HOSPITAL_BASED_OUTPATIENT_CLINIC_OR_DEPARTMENT_OTHER): Payer: Self-pay

## 2023-07-25 ENCOUNTER — Encounter (HOSPITAL_COMMUNITY): Payer: Self-pay | Admitting: Physician Assistant

## 2023-07-25 ENCOUNTER — Ambulatory Visit (HOSPITAL_COMMUNITY)
Admission: RE | Admit: 2023-07-25 | Discharge: 2023-07-25 | Disposition: A | Discharge status: Home / Self Care | Payer: Medicare Other | Source: Ambulatory Visit | Attending: Physician Assistant | Admitting: Physician Assistant

## 2023-07-25 VITALS — BP 138/90 | HR 72 | Ht 65.0 in | Wt 197.6 lb

## 2023-07-25 DIAGNOSIS — I4819 Other persistent atrial fibrillation: Secondary | ICD-10-CM | POA: Insufficient documentation

## 2023-07-25 DIAGNOSIS — I4892 Unspecified atrial flutter: Secondary | ICD-10-CM | POA: Insufficient documentation

## 2023-07-25 DIAGNOSIS — Z7901 Long term (current) use of anticoagulants: Secondary | ICD-10-CM | POA: Insufficient documentation

## 2023-07-25 DIAGNOSIS — Z79899 Other long term (current) drug therapy: Secondary | ICD-10-CM | POA: Insufficient documentation

## 2023-07-25 MED ORDER — APIXABAN 5 MG PO TABS
5.0000 mg | ORAL_TABLET | Freq: Two times a day (BID) | ORAL | 3 refills | Status: DC
  Filled 2023-07-25: qty 60, 30d supply, fill #0

## 2023-07-25 MED ORDER — FLECAINIDE ACETATE 100 MG PO TABS
100.0000 mg | ORAL_TABLET | Freq: Two times a day (BID) | ORAL | 3 refills | Status: DC
  Filled 2023-07-25: qty 60, 30d supply, fill #0

## 2023-07-25 NOTE — Progress Notes (Signed)
Primary Care Physician: Tollie Eth, NP Primary Cardiologist: Dr Jens Som  Primary Electrophysiologist: Dr Johney Frame Referring Physician: Kriste Basque Dunn/Dr Dillard Cannon Puskarich is a 72 y.o. female with a history of persistent atrial fibrillation who presents for follow up in the [facility]. The patient was initially diagnosed with atrial fibrillation 05/20/20 after presenting to her PCP office with migraine symptoms. She was found incidentally to be in afib with RVR and was sent to the ED. She was admitted and started on Eliquis for a CHADS2VASC score of 2 and diltiazem for rate control. She was asymptomatic with her arrhythmia. Echo done during her admission showed normal LVEF, moderate LA dilation, degenerative MV with mild MR. Patient is s/p DCCV on 07/07/20. Patient is s/p DCCV on 11/07/20 after starting flecainide but had atrial flutter on follow up. She is s/p afib and flutter ablation with Dr Johney Frame on 01/13/21.   Patient called the clinic 7/8 stating that she had gone back into afib with RVR. Confirmed on her Kardia mobile. Eliquis and diltiazem were resumed. She converted to SR 03/22/23.  On follow up today, patient reports that over the past week she has been having much more frequent afib, several times per day. Kardia mobile strips personally reviewed today. There are no specific triggers that she can identify. No bleeding issues on anticoagulation.   Today, she denies symptoms of chest pain, shortness of breath, orthopnea, PND, lower extremity edema, dizziness, presyncope, syncope, bleeding, or neurologic sequela. The patient is tolerating medications without difficulties and is otherwise without complaint today.    Atrial Fibrillation Risk Factors:  she does not have symptoms or diagnosis of sleep apnea. Sleep study 11/30/20 negative.  she does not have a history of rheumatic fever. she does not have a history of alcohol use. The patient does not have a  history of early familial atrial fibrillation or other arrhythmias.   Atrial Fibrillation Management history:  Previous antiarrhythmic drugs: flecainide Previous cardioversions: 07/07/20, 11/07/20 Previous ablations: 01/13/21 Anticoagulation history: Eliquis   Past Medical History:  Diagnosis Date   Arthritis    Benign hematuria    GERD (gastroesophageal reflux disease)    Medicare annual wellness visit, subsequent 08/12/2022   Medicare annual wellness visit, subsequent 08/12/2022   Migraines    rare   Myocardial infarction [facility]) 2022   Ablation 2022   Persistent atrial fibrillation (HCC) 05/21/2020   Typical atrial flutter (HCC) 11/13/2020   ROS- All systems are reviewed and negative except as per the HPI above.  Physical Exam: Vitals:   07/25/23 1430  BP: (!) 138/90  Pulse: 72  Weight: 89.6 kg  Height: 5\' 5"  (1.651 m)    GEN: Well nourished, well developed in no acute distress NECK: No JVD; No carotid bruits CARDIAC: Regular rate and rhythm, no murmurs, rubs, gallops RESPIRATORY:  Clear to auscultation without rales, wheezing or rhonchi  ABDOMEN: Soft, non-tender, non-distended EXTREMITIES:  No edema; No deformity    Wt Readings from Last 3 Encounters:  07/25/23 89.6 kg  03/25/23 93 kg  10/25/22 93.8 kg    EKG today demonstrates SR Vent. rate 72 BPM PR interval 172 ms QRS duration 76 ms QT/QTcB 384/420 ms   Echo 05/21/20 demonstrated  1. Left ventricular ejection fraction, by estimation, is 55 to 60%. The  left ventricle has normal function. The left ventricle has no regional  wall motion abnormalities. Left ventricular diastolic parameters are  indeterminate.   2. Right ventricular systolic  function is normal. The right ventricular  size is normal.   3. Left atrial size was moderately dilated.   4. The mitral valve is degenerative. Mild mitral valve regurgitation. No  evidence of mitral stenosis. Moderate mitral annular calcification.   5. The aortic  valve is tricuspid. Aortic valve regurgitation is not  visualized. No aortic stenosis is present.   6. The inferior vena cava is normal in size with greater than 50%  respiratory variability, suggesting right atrial pressure of 3 mmHg.   Epic records are reviewed at length today  CHA2DS2-VASc Score = 2  The patient's score is based upon: CHF History: 0 HTN History: 0 Diabetes History: 0 Stroke History: 0 Vascular Disease History: 0 Age Score: 1 Gender Score: 1     ASSESSMENT AND PLAN: Persistent Atrial Fibrillation/atrial flutter The patient's CHA2DS2-VASc score is 2, indicating a 2.2% annual risk of stroke.   S/p afib and flutter ablation 01/13/21 She is having much more frequent symptomatic afib episodes. We discussed rhythm control options today. Long term, she would like to discuss repeat ablation with Dr Lalla Brothers, will refer. In the meantime, will resume flecainide 100 mg BID. Recheck ECG next week. Continue Eliquis 5 mg BID Continue diltiazem 240 mg daily Kardia mobile for home monitoring     Follow up in the AF clinic next week for ECG.    Jorja Loa PA-C Afib Clinic [facility] [address] [place], [place] [address] [phone] 07/25/2023 2:54 PM

## 2023-07-25 NOTE — Patient Instructions (Signed)
Start Flecainide 100mg twice a day 

## 2023-07-26 ENCOUNTER — Other Ambulatory Visit (HOSPITAL_BASED_OUTPATIENT_CLINIC_OR_DEPARTMENT_OTHER): Payer: Self-pay

## 2023-07-26 ENCOUNTER — Telehealth: Payer: Self-pay | Admitting: Cardiology

## 2023-07-26 NOTE — Telephone Encounter (Signed)
Left message for patient to call back  

## 2023-07-26 NOTE — Telephone Encounter (Signed)
Pt would like a c/b to discuss possible second Ablation per Afib Clinic. Please advise

## 2023-08-02 ENCOUNTER — Other Ambulatory Visit (HOSPITAL_BASED_OUTPATIENT_CLINIC_OR_DEPARTMENT_OTHER): Payer: Self-pay

## 2023-08-02 ENCOUNTER — Other Ambulatory Visit: Payer: Self-pay

## 2023-08-02 ENCOUNTER — Telehealth: Payer: Self-pay

## 2023-08-02 ENCOUNTER — Ambulatory Visit (HOSPITAL_COMMUNITY)
Admission: RE | Admit: 2023-08-02 | Discharge: 2023-08-02 | Disposition: A | Discharge status: Home / Self Care | Payer: Medicare Other | Source: Ambulatory Visit | Attending: Physician Assistant | Admitting: Physician Assistant

## 2023-08-02 VITALS — BP 122/88 | HR 97 | Ht 65.0 in | Wt 197.5 lb

## 2023-08-02 DIAGNOSIS — I4892 Unspecified atrial flutter: Secondary | ICD-10-CM | POA: Insufficient documentation

## 2023-08-02 DIAGNOSIS — I4819 Other persistent atrial fibrillation: Secondary | ICD-10-CM | POA: Diagnosis not present

## 2023-08-02 DIAGNOSIS — Z5971 Insufficient health insurance coverage: Secondary | ICD-10-CM | POA: Insufficient documentation

## 2023-08-02 DIAGNOSIS — Z7901 Long term (current) use of anticoagulants: Secondary | ICD-10-CM | POA: Diagnosis not present

## 2023-08-02 MED ORDER — WARFARIN SODIUM 2.5 MG PO TABS
ORAL_TABLET | ORAL | 0 refills | Status: DC
  Filled 2023-08-02: qty 30, 30d supply, fill #0

## 2023-08-02 MED ORDER — AMIODARONE HCL 200 MG PO TABS
ORAL_TABLET | ORAL | 3 refills | Status: DC
  Filled 2023-08-02: qty 90, 60d supply, fill #0
  Filled 2023-09-26: qty 90, 90d supply, fill #1
  Filled 2023-12-25: qty 90, 90d supply, fill #2

## 2023-08-02 NOTE — Telephone Encounter (Signed)
Shona Simpson, RN  P Cv Div Nl Anticoag Pt needs appt to transition to coumadin unable to afford eliquis [facility] is ok to order initial coumadin order pt has about 3 weeks of eliquis left and is a new amiodarone start as of Friday Thanks stacy  Received message above. Called pt to discuss transitioning from Eliquis to Warfarin, no answer. Left message with call back number.

## 2023-08-02 NOTE — Patient Instructions (Signed)
Stop flecainide  On Friday - Start Amiodarone 200mg  twice a day with food for 1 month then reduce to once a day  Coumadin clinic will call to schedule

## 2023-08-02 NOTE — Telephone Encounter (Signed)
.  patient is returning phone call

## 2023-08-02 NOTE — Progress Notes (Signed)
Primary Care Physician: Tollie Eth, NP Primary Cardiologist: Dr Jens Som  Primary Electrophysiologist: Dr Lalla Brothers Referring Physician: Kriste Basque Dunn/Dr Dillard Cannon Lisa Moore is a 72 y.o. female with a history of persistent atrial fibrillation who presents for follow up in the [facility]. The patient was initially diagnosed with atrial fibrillation 05/20/20 after presenting to her PCP office with migraine symptoms. She was found incidentally to be in afib with RVR and was sent to the ED. She was admitted and started on Eliquis for a CHADS2VASC score of 2 and diltiazem for rate control. She was asymptomatic with her arrhythmia. Echo done during her admission showed normal LVEF, moderate LA dilation, degenerative MV with mild MR. Patient is s/p DCCV on 07/07/20. Patient is s/p DCCV on 11/07/20 after starting flecainide but had atrial flutter on follow up. She is s/p afib and flutter ablation with Dr Johney Frame on 01/13/21.   Patient called the clinic 7/8 stating that she had gone back into afib with RVR. Confirmed on her Kardia mobile. Eliquis and diltiazem were resumed. She converted to SR 03/22/23.  On follow up today, patient reports that she has continued to have very frequent afib episodes despite flecainide. She is in afib today. No bleeding issues on anticoagulation.   Today, she denies symptoms of chest pain, shortness of breath, orthopnea, PND, lower extremity edema, dizziness, presyncope, syncope, bleeding, or neurologic sequela. The patient is tolerating medications without difficulties and is otherwise without complaint today.    Atrial Fibrillation Risk Factors:  she does not have symptoms or diagnosis of sleep apnea. Sleep study 11/30/20 negative.  she does not have a history of rheumatic fever. she does not have a history of alcohol use. The patient does not have a history of early familial atrial fibrillation or other arrhythmias.   Atrial Fibrillation  Management history:  Previous antiarrhythmic drugs: flecainide Previous cardioversions: 07/07/20, 11/07/20 Previous ablations: 01/13/21 Anticoagulation history: Eliquis   Past Medical History:  Diagnosis Date   Arthritis    Benign hematuria    GERD (gastroesophageal reflux disease)    Medicare annual wellness visit, subsequent 08/12/2022   Medicare annual wellness visit, subsequent 08/12/2022   Migraines    rare   Myocardial infarction [facility]) 2022   Ablation 2022   Persistent atrial fibrillation (HCC) 05/21/2020   Typical atrial flutter (HCC) 11/13/2020   ROS- All systems are reviewed and negative except as per the HPI above.  Physical Exam: Vitals:   08/02/23 1324  BP: 122/88  Pulse: 97  Weight: 89.6 kg    GEN: Well nourished, well developed in no acute distress NECK: No JVD; No carotid bruits CARDIAC: Irregularly irregular rate and rhythm, no murmurs, rubs, gallops RESPIRATORY:  Clear to auscultation without rales, wheezing or rhonchi  ABDOMEN: Soft, non-tender, non-distended EXTREMITIES:  No edema; No deformity    Wt Readings from Last 3 Encounters:  08/02/23 89.6 kg  07/25/23 89.6 kg  03/25/23 93 kg    EKG today demonstrates Afib Vent. rate 97 BPM PR interval * ms QRS duration 90 ms QT/QTcB 354/449 ms   Echo 05/21/20 demonstrated  1. Left ventricular ejection fraction, by estimation, is 55 to 60%. The  left ventricle has normal function. The left ventricle has no regional  wall motion abnormalities. Left ventricular diastolic parameters are  indeterminate.   2. Right ventricular systolic function is normal. The right ventricular  size is normal.   3. Left atrial size was moderately dilated.  4. The mitral valve is degenerative. Mild mitral valve regurgitation. No  evidence of mitral stenosis. Moderate mitral annular calcification.   5. The aortic valve is tricuspid. Aortic valve regurgitation is not  visualized. No aortic stenosis is present.   6. The  inferior vena cava is normal in size with greater than 50%  respiratory variability, suggesting right atrial pressure of 3 mmHg.   Epic records are reviewed at length today  CHA2DS2-VASc Score = 2  The patient's score is based upon: CHF History: 0 HTN History: 0 Diabetes History: 0 Stroke History: 0 Vascular Disease History: 0 Age Score: 1 Gender Score: 1     ASSESSMENT AND PLAN: Persistent Atrial Fibrillation/atrial flutter The patient's CHA2DS2-VASc score is 2, indicating a 2.2% annual risk of stroke.   S/p afib and flutter ablation 01/13/21 She is scheduled to discuss repeat ablation with Dr Lalla Brothers 09/26/23 Flecainide has not helped to reduce her episodes of afib. We discussed alternate options. Will stop flecainide and start amiodarone after 3 day washout. Will start 200 mg BID x 4 weeks then decrease to once daily.  Patient reports that Eliquis is cost prohibitive. Will start warfarin and refer her to the CC clinic. She has 2-3 weeks of Eliquis 5 mg BID for now.  Continue diltiazem 240 mg daily Kardia mobile for home monitoring     Follow up in the AF clinic in 2-3 weeks   Jorja Loa PA-C Afib Clinic [facility] [address] [place], [place] [address] [phone] 08/02/2023 2:12 PM

## 2023-08-02 NOTE — Telephone Encounter (Addendum)
Called and spoke with pt. She has approximately 3 weeks left of Eliquis. She will continue Eliquis 5mg  BID until 08/21/23. On 08/21/23 she will continue Eliquis AND start Warfarin 2.5mg  daily x 3 days (12/8, 12/9, & 12/10). On 12/11 she will discontinue Eliquis and continue Warfarin daily. A full discussion of the nature of anticoagulants was carried out.  A benefit risk analysis was presented to the patient and understand the justification for choosing anticoagulation at this time. The need for frequent and regular monitoring, precise dosage adjustment and compliance was stressed.  Side effects of potential bleeding were discussed.  The patient understands she should avoid any OTC items containing aspirin or ibuprofen, and should avoid great swings in general diet.  Confirmed with [place], PA pt should start on Warfarin 2.5mg  daily. Order placed and sent to requested pharmacy. Sent pt instructions via MyChart as she requested. Scheduled pt coumadin clinic appt on 08/25/23 at 10:30am, pt is aware and verbalized understanding.

## 2023-08-25 ENCOUNTER — Encounter (HOSPITAL_COMMUNITY): Payer: Self-pay | Admitting: Physician Assistant

## 2023-08-25 ENCOUNTER — Ambulatory Visit: Payer: Medicare Other | Admitting: *Deleted

## 2023-08-25 ENCOUNTER — Ambulatory Visit
Admission: RE | Admit: 2023-08-25 | Discharge: 2023-08-25 | Disposition: A | Discharge status: Home / Self Care | Payer: Medicare Other | Source: Ambulatory Visit | Attending: Physician Assistant | Admitting: Physician Assistant

## 2023-08-25 VITALS — BP 138/90 | HR 78 | Ht 65.0 in | Wt 199.2 lb

## 2023-08-25 DIAGNOSIS — I498 Other specified cardiac arrhythmias: Secondary | ICD-10-CM | POA: Diagnosis not present

## 2023-08-25 DIAGNOSIS — Z79899 Other long term (current) drug therapy: Secondary | ICD-10-CM | POA: Diagnosis not present

## 2023-08-25 DIAGNOSIS — Z5181 Encounter for therapeutic drug level monitoring: Secondary | ICD-10-CM | POA: Diagnosis not present

## 2023-08-25 DIAGNOSIS — I4819 Other persistent atrial fibrillation: Secondary | ICD-10-CM

## 2023-08-25 DIAGNOSIS — I4892 Unspecified atrial flutter: Secondary | ICD-10-CM | POA: Insufficient documentation

## 2023-08-25 DIAGNOSIS — Z7901 Long term (current) use of anticoagulants: Secondary | ICD-10-CM | POA: Insufficient documentation

## 2023-08-25 LAB — POCT INR: INR: 1.1 — AB (ref 2.0–3.0)

## 2023-08-25 NOTE — Patient Instructions (Addendum)
A full discussion of the nature of anticoagulants has been carried out.  A benefit risk analysis has been presented to the patient, so that they understand the justification for choosing anticoagulation at this time. The need for frequent and regular monitoring, precise dosage adjustment and compliance is stressed.  Side effects of potential bleeding are discussed.  The patient should avoid any OTC items containing aspirin or ibuprofen, and should avoid great swings in general diet.  Avoid alcohol consumption.  Call if any signs of abnormal bleeding.   Description   Today take 2 tablets (5mg ) of warfarin then START taking 1 tablet (2.5mg ) daily except 2 tablet 5mg  on Thursdays. Recheck INR on Tuesday. Anticoagulation Clinic [phone]

## 2023-08-25 NOTE — Progress Notes (Signed)
Primary Care Physician: Tollie Eth, NP Primary Cardiologist: Dr Jens Som  Primary Electrophysiologist: Dr Lalla Brothers Referring Physician: Kriste Basque Moore/Dr Dillard Cannon Lisa is a 72 y.o. female with a history of persistent atrial fibrillation who presents for follow up in the [facility].     The patient was initially diagnosed with atrial fibrillation 05/20/20 after presenting to her PCP office with migraine symptoms. She was found incidentally to be in afib with RVR and was sent to the ED. She was admitted and started on Eliquis for a CHADS2VASC score of 2 and diltiazem for rate control. She was asymptomatic with her arrhythmia. Echo done during her admission showed normal LVEF, moderate LA dilation, degenerative MV with mild MR. Patient is s/p DCCV on 07/07/20. Patient is s/p DCCV on 11/07/20 after starting flecainide but had atrial flutter on follow up. She is s/p afib and flutter ablation with Dr Johney Frame on 01/13/21.   Patient called the clinic 7/8 stating that she had gone back into afib with RVR. Confirmed on her Kardia mobile. Eliquis and diltiazem were resumed. She converted to SR 03/22/23. At her visit on 07/25/23 she reported more frequent afib episodes and flecainide was resumed. Unfortunately, her afib burden did not improve and she was transitioned to amiodarone.       On follow up today, patient reports that since starting amiodarone she has only had two brief afib episodes. She is tolerating the medication without difficulty. No bleeding issues on anticoagulation. She has transitioned from Eliquis to warfarin due to cost concerns.   Today, she denies symptoms of chest pain, shortness of breath, orthopnea, PND, lower extremity edema, dizziness, presyncope, syncope, bleeding, or neurologic sequela. The patient is tolerating medications without difficulties and is otherwise without complaint today.    Atrial Fibrillation Risk Factors:  she does not have  symptoms or diagnosis of sleep apnea. Sleep study 11/30/20 negative.  she does not have a history of rheumatic fever. she does not have a history of alcohol use. The patient does not have a history of early familial atrial fibrillation or other arrhythmias.   Atrial Fibrillation Management history:  Previous antiarrhythmic drugs: flecainide Previous cardioversions: 07/07/20, 11/07/20 Previous ablations: 01/13/21 Anticoagulation history: Eliquis, warfarin   Past Medical History:  Diagnosis Date   Arthritis    Benign hematuria    GERD (gastroesophageal reflux disease)    Medicare annual wellness visit, subsequent 08/12/2022   Medicare annual wellness visit, subsequent 08/12/2022   Migraines    rare   Myocardial infarction [facility]) 2022   Ablation 2022   Persistent atrial fibrillation (HCC) 05/21/2020   Typical atrial flutter (HCC) 11/13/2020   ROS- All systems are reviewed and negative except as per the HPI above.  Physical Exam: Vitals:   08/25/23 1408  BP: (!) 138/90  Pulse: 78  Weight: 90.4 kg  Height: 5\' 5"  (1.651 m)    GEN: Well nourished, well developed in no acute distress NECK: No JVD; No carotid bruits CARDIAC: Regular rate and rhythm, no murmurs, rubs, gallops RESPIRATORY:  Clear to auscultation without rales, wheezing or rhonchi  ABDOMEN: Soft, non-tender, non-distended EXTREMITIES:  No edema; No deformity    Wt Readings from Last 3 Encounters:  08/25/23 90.4 kg  08/02/23 89.6 kg  07/25/23 89.6 kg    EKG today demonstrates SR, PAC couplet  Vent. rate 78 BPM PR interval 176 ms QRS duration 76 ms QT/QTcB 372/424 ms   Echo 05/21/20 demonstrated  1. Left  ventricular ejection fraction, by estimation, is 55 to 60%. The  left ventricle has normal function. The left ventricle has no regional  wall motion abnormalities. Left ventricular diastolic parameters are  indeterminate.   2. Right ventricular systolic function is normal. The right ventricular  size is  normal.   3. Left atrial size was moderately dilated.   4. The mitral valve is degenerative. Mild mitral valve regurgitation. No  evidence of mitral stenosis. Moderate mitral annular calcification.   5. The aortic valve is tricuspid. Aortic valve regurgitation is not  visualized. No aortic stenosis is present.   6. The inferior vena cava is normal in size with greater than 50%  respiratory variability, suggesting right atrial pressure of 3 mmHg.   Epic records are reviewed at length today  CHA2DS2-VASc Score = 2  The patient's score is based upon: CHF History: 0 HTN History: 0 Diabetes History: 0 Stroke History: 0 Vascular Disease History: 0 Age Score: 1 Gender Score: 1     ASSESSMENT AND PLAN: Persistent Atrial Fibrillation/atrial flutter The patient's CHA2DS2-VASc score is 2, indicating a 2.2% annual risk of stroke.   S/p afib and flutter ablation 01/13/21 Failed flecainide  She is scheduled to discuss repeat ablation with Dr Lalla Brothers 09/26/23 Afib burden improved on amiodarone.  Continue amiodarone 200 mg BID for one more week then decrease to 200 mg daily Continue diltiazem 240 mg daily Continue warfarin  Kardia mobile for home monitoring     Follow up with Dr Lalla Brothers as scheduled.    Jorja Loa PA-C Afib Clinic [facility] [address] [place], [place] [address] [phone] 08/25/2023 2:22 PM

## 2023-08-30 ENCOUNTER — Ambulatory Visit: Payer: Medicare Other | Attending: Cardiology | Admitting: *Deleted

## 2023-08-30 ENCOUNTER — Other Ambulatory Visit (HOSPITAL_BASED_OUTPATIENT_CLINIC_OR_DEPARTMENT_OTHER): Payer: Self-pay

## 2023-08-30 DIAGNOSIS — Z5181 Encounter for therapeutic drug level monitoring: Secondary | ICD-10-CM | POA: Insufficient documentation

## 2023-08-30 DIAGNOSIS — I4819 Other persistent atrial fibrillation: Secondary | ICD-10-CM | POA: Insufficient documentation

## 2023-08-30 LAB — POCT INR: INR: 1.2 — AB (ref 2.0–3.0)

## 2023-08-30 MED ORDER — WARFARIN SODIUM 2.5 MG PO TABS
ORAL_TABLET | ORAL | 1 refills | Status: DC
  Filled 2023-08-30: qty 50, 25d supply, fill #0
  Filled 2023-09-26: qty 50, 25d supply, fill #1

## 2023-08-30 NOTE — Patient Instructions (Signed)
Description   Today take 2 tablets (5mg ) of warfarin then START taking 1 tablet (2.5mg ) daily except 2 tablet 5mg  on Tuesdays, Thursdays, Saturdays. Recheck INR in 1 week. Anticoagulation Clinic [phone] Amio 200mg  two times daily for 30 day, THEN 1 tablet (200 mg total) daily-will start 09/04/23.

## 2023-09-05 ENCOUNTER — Ambulatory Visit: Payer: Medicare Other | Attending: Cardiology

## 2023-09-05 DIAGNOSIS — I4819 Other persistent atrial fibrillation: Secondary | ICD-10-CM | POA: Insufficient documentation

## 2023-09-05 DIAGNOSIS — Z5181 Encounter for therapeutic drug level monitoring: Secondary | ICD-10-CM | POA: Diagnosis not present

## 2023-09-05 LAB — POCT INR: INR: 1.8 — AB (ref 2.0–3.0)

## 2023-09-05 NOTE — Patient Instructions (Signed)
Today take 2 tablets (5mg ) of warfarin then continue taking 1 tablet (2.5mg ) daily except 2 tablet 5mg  on Tuesdays, Thursdays, Saturdays. Recheck INR in 1 week. Anticoagulation Clinic [phone] Amio 1 tablet (200 mg total) daily-will start 09/04/23.

## 2023-09-15 ENCOUNTER — Ambulatory Visit: Payer: Medicare Other | Attending: Internal Medicine

## 2023-09-15 DIAGNOSIS — I4819 Other persistent atrial fibrillation: Secondary | ICD-10-CM | POA: Diagnosis not present

## 2023-09-15 LAB — POCT INR: INR: 1.4 — AB (ref 2.0–3.0)

## 2023-09-15 NOTE — Patient Instructions (Signed)
 Description   Today take 3 tablets (5mg ) of warfarin then START 2 tablet (5mg ) daily except 1 tablet (2.5mg ) on Mondays and Wednesdays.  Recheck INR in 1 week.  Anticoagulation Clinic [phone] Amio 1 tablet (200 mg total) daily-will start 09/04/23.

## 2023-09-22 ENCOUNTER — Ambulatory Visit: Payer: Medicare Other | Attending: Cardiology

## 2023-09-22 DIAGNOSIS — Z5181 Encounter for therapeutic drug level monitoring: Secondary | ICD-10-CM | POA: Diagnosis not present

## 2023-09-22 DIAGNOSIS — I4819 Other persistent atrial fibrillation: Secondary | ICD-10-CM | POA: Insufficient documentation

## 2023-09-22 LAB — POCT INR: INR: 1.7 — AB (ref 2.0–3.0)

## 2023-09-22 NOTE — Patient Instructions (Signed)
 Description   Today take 3 tablets (5mg ) of warfarin then START 2 tablet (5mg ) daily except 1 tablet (2.5mg ) on Wednesdays.  Recheck INR in 1 week.  Anticoagulation Clinic [phone] Amio 1 tablet (200 mg total) daily-will start 09/04/23.

## 2023-09-26 ENCOUNTER — Encounter: Payer: Self-pay | Admitting: Cardiology

## 2023-09-26 ENCOUNTER — Other Ambulatory Visit: Payer: Self-pay

## 2023-09-26 ENCOUNTER — Ambulatory Visit: Payer: Medicare Other | Attending: Cardiology | Admitting: Cardiology

## 2023-09-26 VITALS — BP 140/90 | HR 65 | Ht 65.0 in | Wt 197.4 lb

## 2023-09-26 DIAGNOSIS — Z79899 Other long term (current) drug therapy: Secondary | ICD-10-CM | POA: Insufficient documentation

## 2023-09-26 DIAGNOSIS — Z8679 Personal history of other diseases of the circulatory system: Secondary | ICD-10-CM

## 2023-09-26 DIAGNOSIS — I4819 Other persistent atrial fibrillation: Secondary | ICD-10-CM | POA: Diagnosis not present

## 2023-09-26 NOTE — Progress Notes (Signed)
 Electrophysiology Office Follow up Visit Note:    Date:  09/26/2023   ID:  Lisa Moore, DOB 1951/04/24, MRN [id number]  PCP:  Oris Camie BRAVO, NP  [facility] Cardiologist:  Redell Shallow, MD  [facility] Electrophysiologist:  OLE ONEIDA HOLTS, MD    Interval History:     Lisa Moore is a 73 y.o. female who presents for a follow up visit.   The patient last all Daril in the A-fib clinic August 25, 2023.  She has a history of persistent atrial fibrillation on Eliquis .  She had a prior A-fib ablation with Dr. Kelsie on Jan 13, 2021.  She has been on flecainide  in the past.  She currently takes amiodarone  for rhythm control and noted an improved burden while taking this medication.  She is on Coumadin  currently. Her INRs have been subtherapeutic the last 5 checks.  During her Jan 13, 2021 catheter ablation, the pulmonary veins, posterior wall and CTI were ablated.  Today she reports intermittent episodes of AF despite treatment with Amiodarone . HR are typically 115-120 while in AF although she had episodes with rates > 150 in the past. She takes amiodarone  without known side effect. She is on coumadin  and her INR is drifting upwards. She reports the costs a/w Eliquis  were prohibitive in the past.      Past medical, surgical, social and family history were reviewed.  ROS:   Please see the history of present illness.    All other systems reviewed and are negative.  EKGs/Labs/Other Studies Reviewed:    The following studies were reviewed today:   INR checks from December 2024 to now are all the subtherapeutic  EKG Interpretation Date/Time:  Monday September 26 2023 16:12:30 EST Ventricular Rate:  65 PR Interval:  182 QRS Duration:  82 QT Interval:  446 QTC Calculation: 463 R Axis:   47  Text Interpretation: Sinus rhythm with Premature atrial complexes Confirmed by Holts Ole [phone]) on 09/26/2023 4:17:01 PM    Physical Exam:    VS:  BP (!) 140/90 (BP  Location: Left Arm, Patient Position: Sitting, Cuff Size: Large)   Pulse 65   Ht 5' 5 (1.651 m)   Wt 197 lb 6.4 oz (89.5 kg)   LMP  (LMP Unknown)   SpO2 95%   BMI 32.85 kg/m     Wt Readings from Last 3 Encounters:  09/26/23 197 lb 6.4 oz (89.5 kg)  08/25/23 199 lb 3.2 oz (90.4 kg)  08/02/23 197 lb 8.5 oz (89.6 kg)     GEN: no distress CARD: RRR, No MRG RESP: No IWOB. CTAB.      ASSESSMENT:    1. Persistent atrial fibrillation (HCC)   2. H/O atrial flutter   3. Encounter for long-term (current) use of high-risk medication    PLAN:    In order of problems listed above:  #Persistent atrial fibrillation #Difficult to control Coumadin /INR The patient has symptomatic atrial fibrillation despite prior catheter ablation in 2022 with Dr. Kelsie.  She was previously on flecainide  but this was ineffective.  She is now on amiodarone .  We discussed treatment strategies for her persistent atrial fibrillation including continued antiarrhythmic drugs, alternative antiarrhythmic drugs and redo catheter ablation.  The patient is interested in pursuing redo catheter ablation.  We also discussed her stroke risk reduction strategy.  She is on Coumadin  but is having difficult to control INR's.  We discussed alternative anticoagulants and left atrial appendage occlusion.  Given her relatively low CHA2DS2-VASc score  of 2, she is not currently a candidate for left atrial appendage occlusion.   Needs updated CMP, TSH and free T4 today given amiodarone  use.   -------------  Discussed treatment options today for AF including antiarrhythmic drug therapy and ablation. Discussed risks, recovery and likelihood of success with each treatment strategy. Risk, benefits, and alternatives to EP study and ablation for afib were discussed. These risks include but are not limited to stroke, bleeding, vascular damage, tamponade, perforation, damage to the esophagus, lungs, phrenic nerve and other structures,  pulmonary vein stenosis, worsening renal function, coronary vasospasm and death.  Discussed potential need for repeat ablation procedures and antiarrhythmic drugs after an initial ablation. The patient understands these risk and wishes to proceed.  We will therefore proceed with catheter ablation at the next available time.  Carto, ICE, anesthesia are requested for the procedure.  Will also obtain CT PV protocol prior to the procedure to exclude LAA thrombus and further evaluate atrial anatomy.  She will need to have a therapeutic INR at the time of the ablation.   Signed, Ole Holts, MD, [facility], [facility] 09/26/2023 4:17 PM    Electrophysiology [facility]

## 2023-09-26 NOTE — Patient Instructions (Addendum)
 Medication Instructions:  Your physician recommends that you continue on your current medications as directed. Please refer to the Current Medication list given to you today.  *If you need a refill on your cardiac medications before your next appointment, please call your pharmacy*   Lab Work: TODAY: CMET, TSH, T4   Testing/Procedures: Cardiac CT Your physician has requested that you have cardiac CT. Cardiac computed tomography (CT) is a painless test that uses an x-ray machine to take clear, detailed pictures of your heart. For further information please visit [email or web address]  We will call you to schedule your CT scan. It will be done about three weeks prior to your ablation.   Ablation Your physician has recommended that you have an ablation. Catheter ablation is a medical procedure used to treat some cardiac arrhythmias (irregular heartbeats). During catheter ablation, a long, thin, flexible tube is put into a blood vessel in your groin (upper thigh), or neck. This tube is called an ablation catheter. It is then guided to your heart through the blood vessel. Radio frequency waves destroy small areas of heart tissue where abnormal heartbeats may cause an arrhythmia to start.  You are scheduled for Atrial Fibrillation Ablation on Friday, February 28 with Dr. Ole Holts.Please arrive at the Main Entrance A at [facility]: [address] [place], [place] [address] at 10:30 AM    Follow-Up: At [facility], you and your health needs are our priority.  As part of our continuing mission to provide you with exceptional heart care, we have created designated Provider Care Teams.  These Care Teams include your primary Cardiologist (physician) and Advanced Practice Providers (APPs -  Physician Assistants and Nurse Practitioners) who all work together to provide you with the care you need, when you need it.  Your next appointment:   We will call you to schedule your  post-procedure follow up appointments.

## 2023-09-27 ENCOUNTER — Other Ambulatory Visit (HOSPITAL_BASED_OUTPATIENT_CLINIC_OR_DEPARTMENT_OTHER): Payer: Self-pay

## 2023-09-27 ENCOUNTER — Other Ambulatory Visit: Payer: Self-pay

## 2023-09-27 LAB — COMPREHENSIVE METABOLIC PANEL
ALT: 9 [IU]/L (ref 0–32)
AST: 16 [IU]/L (ref 0–40)
Albumin: 4.6 g/dL (ref 3.8–4.8)
Alkaline Phosphatase: 80 [IU]/L (ref 44–121)
BUN/Creatinine Ratio: 16 (ref 12–28)
BUN: 17 mg/dL (ref 8–27)
Bilirubin Total: 0.3 mg/dL (ref 0.0–1.2)
CO2: 19 mmol/L — ABNORMAL LOW (ref 20–29)
Calcium: 10.1 mg/dL (ref 8.7–10.3)
Chloride: 104 mmol/L (ref 96–106)
Creatinine, Ser: 1.08 mg/dL — ABNORMAL HIGH (ref 0.57–1.00)
Globulin, Total: 3 g/dL (ref 1.5–4.5)
Glucose: 99 mg/dL (ref 70–99)
Potassium: 4.7 mmol/L (ref 3.5–5.2)
Sodium: 142 mmol/L (ref 134–144)
Total Protein: 7.6 g/dL (ref 6.0–8.5)
eGFR: 54 mL/min/{1.73_m2} — ABNORMAL LOW (ref >59)

## 2023-09-27 LAB — TSH: TSH: 3.32 u[IU]/mL (ref 0.450–4.500)

## 2023-09-27 LAB — T4, FREE: Free T4: 1.27 ng/dL (ref 0.82–1.77)

## 2023-09-29 ENCOUNTER — Ambulatory Visit: Payer: Medicare Other | Attending: Internal Medicine

## 2023-09-29 ENCOUNTER — Other Ambulatory Visit (HOSPITAL_BASED_OUTPATIENT_CLINIC_OR_DEPARTMENT_OTHER): Payer: Self-pay

## 2023-09-29 ENCOUNTER — Encounter: Payer: Medicare Other | Admitting: Nurse Practitioner

## 2023-09-29 DIAGNOSIS — I4819 Other persistent atrial fibrillation: Secondary | ICD-10-CM | POA: Insufficient documentation

## 2023-09-29 LAB — POCT INR: INR: 2.1 (ref 2.0–3.0)

## 2023-09-29 NOTE — Patient Instructions (Signed)
Description   Continue taking 2 tablet (5mg ) daily except 1 tablet (2.5mg ) on Wednesdays.  Recheck INR in 1 week.  Anticoagulation Clinic [phone] Amio 1 tablet (200 mg total) daily-will start 09/04/23.

## 2023-10-05 ENCOUNTER — Other Ambulatory Visit: Payer: Self-pay

## 2023-10-05 DIAGNOSIS — I4819 Other persistent atrial fibrillation: Secondary | ICD-10-CM

## 2023-10-06 ENCOUNTER — Ambulatory Visit (HOSPITAL_COMMUNITY): Payer: Medicare Other | Admitting: Physician Assistant

## 2023-10-06 ENCOUNTER — Ambulatory Visit: Payer: Medicare Other | Attending: Cardiology | Admitting: *Deleted

## 2023-10-06 DIAGNOSIS — Z5181 Encounter for therapeutic drug level monitoring: Secondary | ICD-10-CM | POA: Diagnosis not present

## 2023-10-06 DIAGNOSIS — I4819 Other persistent atrial fibrillation: Secondary | ICD-10-CM | POA: Insufficient documentation

## 2023-10-06 LAB — POCT INR: INR: 1.6 — AB (ref 2.0–3.0)

## 2023-10-06 NOTE — Patient Instructions (Signed)
Description   Today take 3 tablets of warfarin then START taking 2 tablet (5mg ) daily. Recheck INR in 1 week.  Anticoagulation Clinic [phone] Amio 1 tablet (200 mg total) daily-will start 09/04/23.

## 2023-10-12 ENCOUNTER — Telehealth: Payer: Self-pay | Admitting: Cardiology

## 2023-10-12 NOTE — Telephone Encounter (Signed)
Pt has questions about Labs for procedure

## 2023-10-12 NOTE — Telephone Encounter (Signed)
Spoke with the patient and advised that she will need labs within 30 days of her procedure. Orders are already in and she can go to any [facility] to have these drawn.

## 2023-10-13 ENCOUNTER — Ambulatory Visit: Payer: Medicare Other | Attending: Cardiology | Admitting: *Deleted

## 2023-10-13 DIAGNOSIS — Z5181 Encounter for therapeutic drug level monitoring: Secondary | ICD-10-CM | POA: Insufficient documentation

## 2023-10-13 DIAGNOSIS — I4819 Other persistent atrial fibrillation: Secondary | ICD-10-CM | POA: Insufficient documentation

## 2023-10-13 LAB — POCT INR: INR: 2.5 (ref 2.0–3.0)

## 2023-10-13 NOTE — Patient Instructions (Signed)
Description   Continue taking warfarin 2 tablet (5mg ) daily. Recheck INR in 1 week.  Anticoagulation Clinic [phone] Amio 1 tablet (200 mg total) daily-will start 09/04/23.

## 2023-10-14 ENCOUNTER — Other Ambulatory Visit (HOSPITAL_COMMUNITY): Payer: Self-pay | Admitting: Physician Assistant

## 2023-10-14 LAB — BASIC METABOLIC PANEL
BUN/Creatinine Ratio: 12 (ref 12–28)
BUN: 22 mg/dL (ref 8–27)
CO2: 21 mmol/L (ref 20–29)
Calcium: 9.6 mg/dL (ref 8.7–10.3)
Chloride: 103 mmol/L (ref 96–106)
Creatinine, Ser: 1.87 mg/dL — ABNORMAL HIGH (ref 0.57–1.00)
Glucose: 93 mg/dL (ref 70–99)
Potassium: 4.5 mmol/L (ref 3.5–5.2)
Sodium: 140 mmol/L (ref 134–144)
eGFR: 28 mL/min/{1.73_m2} — ABNORMAL LOW (ref >59)

## 2023-10-14 LAB — CBC
Hematocrit: 41.5 % (ref 34.0–46.6)
Hemoglobin: 13.8 g/dL (ref 11.1–15.9)
MCH: 31.9 pg (ref 26.6–33.0)
MCHC: 33.3 g/dL (ref 31.5–35.7)
MCV: 96 fL (ref 79–97)
Platelets: 338 10*3/uL (ref 150–450)
RBC: 4.33 x10E6/uL (ref 3.77–5.28)
RDW: 12.1 % (ref 11.7–15.4)
WBC: 8.7 10*3/uL (ref 3.4–10.8)

## 2023-10-16 ENCOUNTER — Encounter: Payer: Self-pay | Admitting: Cardiology

## 2023-10-17 ENCOUNTER — Telehealth: Payer: Self-pay

## 2023-10-17 ENCOUNTER — Other Ambulatory Visit (HOSPITAL_BASED_OUTPATIENT_CLINIC_OR_DEPARTMENT_OTHER): Payer: Self-pay

## 2023-10-17 ENCOUNTER — Other Ambulatory Visit: Payer: Self-pay

## 2023-10-17 DIAGNOSIS — I4819 Other persistent atrial fibrillation: Secondary | ICD-10-CM

## 2023-10-17 MED ORDER — DILTIAZEM HCL 30 MG PO TABS
ORAL_TABLET | ORAL | 1 refills | Status: DC
  Filled 2023-10-17: qty 30, 30d supply, fill #0

## 2023-10-17 NOTE — Telephone Encounter (Signed)
Pt pharmacy requesting refill on medication previously managed by afib clinic. Please advise. Thank you

## 2023-10-17 NOTE — Telephone Encounter (Signed)
-----   Message from Rossie [facility] sent at 10/16/2023 11:05 AM EST ----- Kidney function has decreased on the most recent blood work.   Carly, can we have her hydrate well and then repeat the blood work in 1 week? Also, lets cancel the pre-ablation CT scan to avoid contrast exposure and plan for a TEE on the table.   Thanks,   Sheria Lang T. Lalla Brothers, MD, [facility], [facility] Cardiac Electrophysiology

## 2023-10-17 NOTE — Telephone Encounter (Signed)
The patient has been notified of the result and verbalized understanding.  All questions (if any) were answered. Frutoso Schatz, RN 10/17/2023 1:20 PM  Labs have been ordered. CT scan has been cancelled.

## 2023-10-18 ENCOUNTER — Other Ambulatory Visit: Payer: Self-pay | Admitting: Physician Assistant

## 2023-10-18 ENCOUNTER — Other Ambulatory Visit: Payer: Self-pay

## 2023-10-18 ENCOUNTER — Other Ambulatory Visit (HOSPITAL_BASED_OUTPATIENT_CLINIC_OR_DEPARTMENT_OTHER): Payer: Self-pay

## 2023-10-18 DIAGNOSIS — I4819 Other persistent atrial fibrillation: Secondary | ICD-10-CM

## 2023-10-18 MED ORDER — WARFARIN SODIUM 2.5 MG PO TABS
ORAL_TABLET | ORAL | 3 refills | Status: DC
  Filled 2023-10-18: qty 65, 30d supply, fill #0
  Filled 2023-11-17: qty 65, 30d supply, fill #1
  Filled 2024-01-04: qty 65, 30d supply, fill #2
  Filled 2024-02-07: qty 65, 30d supply, fill #3
  Filled ????-??-??: fill #1

## 2023-10-18 NOTE — Telephone Encounter (Signed)
Warfarin 2.5mg  refill Afib Last INR 10/13/23 Last OV 09/26/23

## 2023-10-19 ENCOUNTER — Ambulatory Visit (HOSPITAL_COMMUNITY): Payer: Medicare Other

## 2023-10-19 ENCOUNTER — Encounter (HOSPITAL_COMMUNITY): Payer: Self-pay

## 2023-10-20 ENCOUNTER — Ambulatory Visit: Payer: Medicare Other

## 2023-10-24 ENCOUNTER — Ambulatory Visit (INDEPENDENT_AMBULATORY_CARE_PROVIDER_SITE_OTHER): Payer: Medicare Other

## 2023-10-24 ENCOUNTER — Ambulatory Visit
Admission: RE | Admit: 2023-10-24 | Discharge: 2023-10-24 | Disposition: A | Discharge status: Home / Self Care | Payer: Medicare Other | Source: Ambulatory Visit | Attending: Nurse Practitioner | Admitting: Nurse Practitioner

## 2023-10-24 ENCOUNTER — Encounter (HOSPITAL_BASED_OUTPATIENT_CLINIC_OR_DEPARTMENT_OTHER): Payer: Self-pay | Admitting: Radiology

## 2023-10-24 DIAGNOSIS — Z5181 Encounter for therapeutic drug level monitoring: Secondary | ICD-10-CM | POA: Insufficient documentation

## 2023-10-24 DIAGNOSIS — I4819 Other persistent atrial fibrillation: Secondary | ICD-10-CM

## 2023-10-24 DIAGNOSIS — Z1231 Encounter for screening mammogram for malignant neoplasm of breast: Secondary | ICD-10-CM | POA: Insufficient documentation

## 2023-10-24 LAB — POCT INR: INR: 3.7 — AB (ref 2.0–3.0)

## 2023-10-24 NOTE — Patient Instructions (Signed)
 Take only 1 tablet tomorrow morning then Continue taking warfarin 2 tablets (5mg ) daily. Recheck INR in 1 week. Ablation 2/28  weekly INRs Anticoagulation Clinic [phone] Amio 1 tablet (200 mg total) daily-will start 09/04/23.

## 2023-10-25 LAB — BASIC METABOLIC PANEL
BUN/Creatinine Ratio: 15 (ref 12–28)
BUN: 19 mg/dL (ref 8–27)
CO2: 21 mmol/L (ref 20–29)
Calcium: 9.4 mg/dL (ref 8.7–10.3)
Chloride: 106 mmol/L (ref 96–106)
Creatinine, Ser: 1.28 mg/dL — ABNORMAL HIGH (ref 0.57–1.00)
Glucose: 76 mg/dL (ref 70–99)
Potassium: 4.6 mmol/L (ref 3.5–5.2)
Sodium: 144 mmol/L (ref 134–144)
eGFR: 44 mL/min/{1.73_m2} — ABNORMAL LOW (ref >59)

## 2023-11-02 ENCOUNTER — Other Ambulatory Visit (HOSPITAL_COMMUNITY): Payer: Self-pay

## 2023-11-02 NOTE — Progress Notes (Signed)
 Patient given samples of Eliquis 5 mg tablet on 08/02/2023. Clint Fenton-PA Lot # [id number] Exp: 07/13/2024 Quantity: 28 tablets Manufacturer: [employer]

## 2023-11-03 ENCOUNTER — Ambulatory Visit: Payer: Medicare Other

## 2023-11-03 ENCOUNTER — Encounter: Payer: Self-pay | Admitting: Nurse Practitioner

## 2023-11-04 ENCOUNTER — Ambulatory Visit: Payer: Medicare Other

## 2023-11-04 DIAGNOSIS — I4819 Other persistent atrial fibrillation: Secondary | ICD-10-CM | POA: Insufficient documentation

## 2023-11-04 LAB — POCT INR: INR: 3 (ref 2.0–3.0)

## 2023-11-04 NOTE — Patient Instructions (Signed)
 Description   Continue taking warfarin 2 tablets (5mg ) daily. Recheck INR in 1 week.  Ablation 2/28  weekly INRs Anticoagulation Clinic [phone] Amio 1 tablet (200 mg total) daily-will start 09/04/23.

## 2023-11-10 ENCOUNTER — Encounter: Payer: Self-pay | Admitting: Emergency Medicine

## 2023-11-10 ENCOUNTER — Ambulatory Visit: Payer: Medicare Other | Attending: Cardiology

## 2023-11-10 DIAGNOSIS — I4819 Other persistent atrial fibrillation: Secondary | ICD-10-CM | POA: Diagnosis not present

## 2023-11-10 DIAGNOSIS — Z5181 Encounter for therapeutic drug level monitoring: Secondary | ICD-10-CM | POA: Diagnosis not present

## 2023-11-10 LAB — POCT INR: INR: 2.8 (ref 2.0–3.0)

## 2023-11-10 NOTE — Patient Instructions (Signed)
 Description   Continue taking warfarin 2 tablets (5mg ) daily. Recheck INR in 1 week.  Ablation 2/28  weekly INRs Anticoagulation Clinic [phone] Amio 1 tablet (200 mg total) daily-will start 09/04/23.

## 2023-11-10 NOTE — Anesthesia Preprocedure Evaluation (Addendum)
 Anesthesia Evaluation  Patient identified by MRN, date of birth, ID band Patient awake    Reviewed: Allergy & Precautions, H&P , NPO status , Patient's Chart, lab work & pertinent test results, Unable to perform ROS - Chart review only  Airway Mallampati: II   Neck ROM: full    Dental   Pulmonary neg pulmonary ROS   breath sounds clear to auscultation       Cardiovascular + Past MI  + dysrhythmias Atrial Fibrillation  Rhythm:irregular Rate:Normal  Echo 2021  1. Left ventricular ejection fraction, by estimation, is 55 to 60%. The left ventricle has normal function. The left ventricle has no regional wall motion abnormalities. Left ventricular diastolic parameters are indeterminate.   2. Right ventricular systolic function is normal. The right ventricular size is normal.   3. Left atrial size was moderately dilated.   4. The mitral valve is degenerative. Mild mitral valve regurgitation. No evidence of mitral stenosis. Moderate mitral annular calcification.   5. The aortic valve is tricuspid. Aortic valve regurgitation is not visualized. No aortic stenosis is present.   6. The inferior vena cava is normal in size with greater than 50% respiratory variability, suggesting right atrial pressure of 3 mmHg.     Neuro/Psych  Headaches    GI/Hepatic ,GERD  ,,  Endo/Other    Renal/GU      Musculoskeletal  (+) Arthritis ,    Abdominal   Peds  Hematology   Anesthesia Other Findings   Reproductive/Obstetrics                             Anesthesia Physical Anesthesia Plan  ASA: 3  Anesthesia Plan: General   Post-op Pain Management: Tylenol PO (pre-op)*   Induction: Intravenous  PONV Risk Score and Plan: 3 and Ondansetron, Dexamethasone and Treatment may vary due to age or medical condition  Airway Management Planned: Oral ETT  Additional Equipment:   Intra-op Plan:   Post-operative Plan:  Extubation in OR  Informed Consent: I have reviewed the patients History and Physical, chart, labs and discussed the procedure including the risks, benefits and alternatives for the proposed anesthesia with the patient or authorized representative who has indicated his/her understanding and acceptance.     Dental advisory given  Plan Discussed with: CRNA, Anesthesiologist and Surgeon  Anesthesia Plan Comments:         Anesthesia Quick Evaluation

## 2023-11-10 NOTE — Pre-Procedure Instructions (Signed)
 Instructed patient on the following items: Arrival time 1000 Nothing to eat or drink after midnight No meds AM of procedure Responsible person to drive you home and stay with you for 24 hrs  Have you missed any doses of anti-coagulant Coumadin- hasn't missed any doses.

## 2023-11-11 ENCOUNTER — Encounter (HOSPITAL_COMMUNITY): Payer: Self-pay

## 2023-11-11 ENCOUNTER — Ambulatory Visit (HOSPITAL_COMMUNITY): Admission: RE | Admit: 2023-11-11 | Payer: Medicare Other | Source: Ambulatory Visit

## 2023-11-11 ENCOUNTER — Telehealth: Payer: Self-pay

## 2023-11-11 DIAGNOSIS — I4819 Other persistent atrial fibrillation: Secondary | ICD-10-CM

## 2023-11-11 NOTE — Telephone Encounter (Signed)
 Pt is aware that due to equipment being down in the lab her AF ablation with Dr. Lalla Brothers today 2/28 has been moved to Tuesday 3/4 - same time 12:30.

## 2023-11-13 ENCOUNTER — Other Ambulatory Visit (HOSPITAL_COMMUNITY): Payer: Self-pay | Admitting: Physician Assistant

## 2023-11-14 ENCOUNTER — Other Ambulatory Visit (HOSPITAL_BASED_OUTPATIENT_CLINIC_OR_DEPARTMENT_OTHER): Payer: Self-pay

## 2023-11-14 MED ORDER — DILTIAZEM HCL ER COATED BEADS 240 MG PO CP24
240.0000 mg | ORAL_CAPSULE | Freq: Every day | ORAL | 3 refills | Status: DC
  Filled 2023-11-17: qty 30, 30d supply, fill #0
  Filled 2023-12-16: qty 30, 30d supply, fill #1
  Filled 2024-01-08: qty 30, 30d supply, fill #2
  Filled ????-??-??: fill #0

## 2023-11-14 NOTE — Pre-Procedure Instructions (Signed)
 Instructed patient on the following items: Arrival time 1000 Nothing to eat or drink after midnight No meds AM of procedure Responsible person to drive you home and stay with you for 24 hrs  Have you missed any doses of anti-coagulant Coumadin- takes once a day, hasn't missed any doses.

## 2023-11-15 ENCOUNTER — Other Ambulatory Visit: Payer: Self-pay

## 2023-11-15 ENCOUNTER — Ambulatory Visit (HOSPITAL_COMMUNITY)
Admission: RE | Admit: 2023-11-15 | Discharge: 2023-11-15 | Disposition: A | Discharge status: Home / Self Care | Payer: Medicare Other | Attending: Cardiology | Admitting: Cardiology

## 2023-11-15 ENCOUNTER — Ambulatory Visit (HOSPITAL_COMMUNITY): Payer: Self-pay | Admitting: Anesthesiology

## 2023-11-15 ENCOUNTER — Ambulatory Visit (HOSPITAL_COMMUNITY)
Admission: RE | Admit: 2023-11-15 | Discharge: 2023-11-15 | Disposition: A | Discharge status: Home / Self Care | Source: Home / Self Care | Attending: Cardiology | Admitting: Cardiology

## 2023-11-15 ENCOUNTER — Ambulatory Visit (HOSPITAL_COMMUNITY)
Admission: RE | Disposition: A | Discharge status: Home / Self Care | Payer: Self-pay | Source: Home / Self Care | Attending: Cardiology

## 2023-11-15 ENCOUNTER — Other Ambulatory Visit (HOSPITAL_COMMUNITY): Payer: Self-pay

## 2023-11-15 ENCOUNTER — Ambulatory Visit (HOSPITAL_BASED_OUTPATIENT_CLINIC_OR_DEPARTMENT_OTHER): Payer: Self-pay | Admitting: Anesthesiology

## 2023-11-15 DIAGNOSIS — I483 Typical atrial flutter: Secondary | ICD-10-CM | POA: Diagnosis not present

## 2023-11-15 DIAGNOSIS — I4819 Other persistent atrial fibrillation: Secondary | ICD-10-CM | POA: Insufficient documentation

## 2023-11-15 DIAGNOSIS — Z7901 Long term (current) use of anticoagulants: Secondary | ICD-10-CM | POA: Diagnosis not present

## 2023-11-15 DIAGNOSIS — I4891 Unspecified atrial fibrillation: Secondary | ICD-10-CM | POA: Diagnosis not present

## 2023-11-15 DIAGNOSIS — I484 Atypical atrial flutter: Secondary | ICD-10-CM | POA: Diagnosis not present

## 2023-11-15 DIAGNOSIS — I252 Old myocardial infarction: Secondary | ICD-10-CM | POA: Insufficient documentation

## 2023-11-15 HISTORY — PX: TRANSESOPHAGEAL ECHOCARDIOGRAM (CATH LAB): EP1270

## 2023-11-15 HISTORY — PX: ATRIAL FIBRILLATION ABLATION: EP1191

## 2023-11-15 LAB — POCT ACTIVATED CLOTTING TIME
Activated Clotting Time: 262 s
Activated Clotting Time: 337 s

## 2023-11-15 LAB — PROTIME-INR
INR: 2 — ABNORMAL HIGH (ref 0.8–1.2)
Prothrombin Time: 22.5 s — ABNORMAL HIGH (ref 11.4–15.2)

## 2023-11-15 SURGERY — ATRIAL FIBRILLATION ABLATION
Anesthesia: General

## 2023-11-15 MED ORDER — ROCURONIUM BROMIDE 10 MG/ML (PF) SYRINGE
PREFILLED_SYRINGE | INTRAVENOUS | Status: DC | PRN
  Administered 2023-11-15: 10 mg via INTRAVENOUS
  Administered 2023-11-15: 50 mg via INTRAVENOUS

## 2023-11-15 MED ORDER — HEPARIN SODIUM (PORCINE) 1000 UNIT/ML IJ SOLN
INTRAMUSCULAR | Status: DC | PRN
Start: 2023-11-15 — End: 2023-11-15
  Administered 2023-11-15: 6000 [IU] via INTRAVENOUS
  Administered 2023-11-15: 9000 [IU] via INTRAVENOUS

## 2023-11-15 MED ORDER — FENTANYL CITRATE (PF) 250 MCG/5ML IJ SOLN
INTRAMUSCULAR | Status: DC | PRN
  Administered 2023-11-15: 100 ug via INTRAVENOUS

## 2023-11-15 MED ORDER — DEXAMETHASONE SODIUM PHOSPHATE 10 MG/ML IJ SOLN
INTRAMUSCULAR | Status: DC | PRN
  Administered 2023-11-15: 5 mg via INTRAVENOUS

## 2023-11-15 MED ORDER — HEPARIN SODIUM (PORCINE) 1000 UNIT/ML IJ SOLN
INTRAMUSCULAR | Status: DC | PRN
  Administered 2023-11-15: 1000 [IU] via INTRAVENOUS

## 2023-11-15 MED ORDER — SUGAMMADEX SODIUM 200 MG/2ML IV SOLN
INTRAVENOUS | Status: DC | PRN
  Administered 2023-11-15: 177 mg via INTRAVENOUS

## 2023-11-15 MED ORDER — SODIUM CHLORIDE 0.9 % IV SOLN: 250.0000 mL | INTRAVENOUS | Status: DC | PRN

## 2023-11-15 MED ORDER — SODIUM CHLORIDE 0.9% FLUSH: 3.0000 mL | INTRAVENOUS | Status: DC | PRN

## 2023-11-15 MED ORDER — ACETAMINOPHEN 325 MG PO TABS: 650.0000 mg | ORAL_TABLET | ORAL | Status: DC | PRN

## 2023-11-15 MED ORDER — FENTANYL CITRATE (PF) 100 MCG/2ML IJ SOLN
INTRAMUSCULAR | Status: AC
  Filled 2023-11-15: qty 2

## 2023-11-15 MED ORDER — ONDANSETRON HCL 4 MG/2ML IJ SOLN: 4.0000 mg | Freq: Four times a day (QID) | INTRAMUSCULAR | Status: DC | PRN

## 2023-11-15 MED ORDER — SODIUM CHLORIDE 0.9% FLUSH: 3.0000 mL | Freq: Two times a day (BID) | INTRAVENOUS | Status: DC

## 2023-11-15 MED ORDER — COLCHICINE 0.6 MG PO TABS
0.6000 mg | ORAL_TABLET | Freq: Two times a day (BID) | ORAL | Status: DC
  Administered 2023-11-15: 0.6 mg via ORAL
  Filled 2023-11-15: qty 1

## 2023-11-15 MED ORDER — ONDANSETRON HCL 4 MG/2ML IJ SOLN
INTRAMUSCULAR | Status: DC | PRN
  Administered 2023-11-15: 4 mg via INTRAVENOUS

## 2023-11-15 MED ORDER — PANTOPRAZOLE SODIUM 40 MG PO TBEC
40.0000 mg | DELAYED_RELEASE_TABLET | Freq: Every day | ORAL | Status: DC
  Administered 2023-11-15: 40 mg via ORAL
  Filled 2023-11-15: qty 1

## 2023-11-15 MED ORDER — PROTAMINE SULFATE 10 MG/ML IV SOLN
INTRAVENOUS | Status: DC | PRN
Start: 2023-11-15 — End: 2023-11-15
  Administered 2023-11-15: 10 mg via INTRAVENOUS
  Administered 2023-11-15: 20 mg via INTRAVENOUS

## 2023-11-15 MED ORDER — ATROPINE SULFATE 0.4 MG/ML IV SOLN
INTRAVENOUS | Status: DC | PRN
Start: 2023-11-15 — End: 2023-11-15
  Administered 2023-11-15: 1 mg via INTRAVENOUS

## 2023-11-15 MED ORDER — PANTOPRAZOLE SODIUM 40 MG PO TBEC
40.0000 mg | DELAYED_RELEASE_TABLET | Freq: Every day | ORAL | 0 refills | Status: DC
  Filled 2023-11-15: qty 45, 45d supply, fill #0

## 2023-11-15 MED ORDER — COLCHICINE 0.6 MG PO TABS
0.6000 mg | ORAL_TABLET | Freq: Every day | ORAL | 0 refills | Status: DC
  Filled 2023-11-15: qty 5, 5d supply, fill #0

## 2023-11-15 MED ORDER — LIDOCAINE 2% (20 MG/ML) 5 ML SYRINGE
INTRAMUSCULAR | Status: DC | PRN
  Administered 2023-11-15: 60 mg via INTRAVENOUS

## 2023-11-15 MED ORDER — PROPOFOL 10 MG/ML IV BOLUS
INTRAVENOUS | Status: DC | PRN
  Administered 2023-11-15: 150 mg via INTRAVENOUS

## 2023-11-15 MED ORDER — HEPARIN SODIUM (PORCINE) 1000 UNIT/ML IJ SOLN
INTRAMUSCULAR | Status: AC
  Filled 2023-11-15: qty 10

## 2023-11-15 MED ORDER — PHENYLEPHRINE HCL-NACL 20-0.9 MG/250ML-% IV SOLN
INTRAVENOUS | Status: DC | PRN
  Administered 2023-11-15: 20 ug/min via INTRAVENOUS

## 2023-11-15 MED ORDER — ACETAMINOPHEN 500 MG PO TABS
1000.0000 mg | ORAL_TABLET | Freq: Once | ORAL | Status: AC
  Administered 2023-11-15: 1000 mg via ORAL
  Filled 2023-11-15: qty 2

## 2023-11-15 MED ORDER — SODIUM CHLORIDE 0.9 % IV SOLN: INTRAVENOUS | Status: DC

## 2023-11-15 SURGICAL SUPPLY — 21 items
BLANKET WARM UNDERBOD FULL ACC (MISCELLANEOUS) ×1 IMPLANT
CABLE PFA RX CATH CONN (CABLE) IMPLANT
CATH 8FR REPROCESSED SOUNDSTAR (CATHETERS) ×1 IMPLANT
CATH 8FR SOUNDSTAR REPROCESSED (CATHETERS) IMPLANT
CATH ABLAT QDOT MICRO BI TC DF (CATHETERS) IMPLANT
CATH FARAWAVE ABLATION 31 (CATHETERS) IMPLANT
CATH OCTARAY 2.0 F 3-3-3-3-3 (CATHETERS) IMPLANT
CATH WEB BI DIR CSDF CRV REPRO (CATHETERS) IMPLANT
CLOSURE PERCLOSE PROSTYLE (VASCULAR PRODUCTS) IMPLANT
COVER SWIFTLINK CONNECTOR (BAG) ×1 IMPLANT
DILATOR VESSEL 38 20CM 16FR (INTRODUCER) IMPLANT
GUIDEWIRE INQWIRE 1.5J.035X260 (WIRE) IMPLANT
INQWIRE 1.5J .035X260CM (WIRE) ×1 IMPLANT
PACK EP LF (CUSTOM PROCEDURE TRAY) ×1 IMPLANT
PAD DEFIB RADIO PHYSIO CONN (PAD) ×1 IMPLANT
PATCH CARTO3 (PAD) IMPLANT
SHEATH FARADRIVE STEERABLE (SHEATH) IMPLANT
SHEATH PINNACLE 8F 10CM (SHEATH) IMPLANT
SHEATH PINNACLE 9F 10CM (SHEATH) IMPLANT
SHEATH WIRE KIT BAYLIS SL1 (KITS) IMPLANT
TUBING SMART ABLATE COOLFLOW (TUBING) IMPLANT

## 2023-11-15 NOTE — Transfer of Care (Signed)
 Immediate Anesthesia Transfer of Care Note  Patient: Lisa Moore  Procedure(s) Performed: ATRIAL FIBRILLATION ABLATION TRANSESOPHAGEAL ECHOCARDIOGRAM  Patient Location: PACU  Anesthesia Type:General  Level of Consciousness: drowsy, patient cooperative, and responds to stimulation  Airway & Oxygen Therapy: Patient Spontanous Breathing and Patient connected to nasal cannula oxygen  Post-op Assessment: Report given to RN and Post -op Vital signs reviewed and stable  Post vital signs: Reviewed and stable  Last Vitals:  Vitals Value Taken Time  BP 134/79 11/15/23 1504  Temp 36.8 C 11/15/23 1506  Pulse 66 11/15/23 1508  Resp 14 11/15/23 1508  SpO2 98 % 11/15/23 1508  Vitals shown include unfiled device data.  Last Pain:  Vitals:   11/15/23 1506  TempSrc: Temporal  PainSc: 0-No pain      Patients Stated Pain Goal: 5 (11/15/23 1033)  Complications: There were no known notable events for this encounter.

## 2023-11-15 NOTE — Progress Notes (Signed)
 Dr Lalla Brothers in to see and ok to d/c home

## 2023-11-15 NOTE — Discharge Instructions (Signed)

## 2023-11-15 NOTE — Anesthesia Procedure Notes (Signed)
 Procedure Name: Intubation Date/Time: 11/15/2023 12:45 PM  Performed by: Darlina Guys, CRNAPre-anesthesia Checklist: Patient identified, Emergency Drugs available, Suction available and Patient being monitored Patient Re-evaluated:Patient Re-evaluated prior to induction Oxygen Delivery Method: Circle System Utilized Preoxygenation: Pre-oxygenation with 100% oxygen Induction Type: IV induction Ventilation: Mask ventilation without difficulty, Oral airway inserted - appropriate to patient size and Two handed mask ventilation required Laryngoscope Size: Glidescope and 3 Grade View: Grade II Tube type: Oral Tube size: 7.0 mm Number of attempts: 1 Airway Equipment and Method: Stylet and Oral airway Placement Confirmation: ETT inserted through vocal cords under direct vision, positive ETCO2 and breath sounds checked- equal and bilateral Secured at: 22 cm Tube secured with: Tape Dental Injury: Teeth and Oropharynx as per pre-operative assessment  Comments: Patient with previous hx of multiple unsuccessful DL's and eventual successful intubation with G/S 3 blade. CRNA went directly  to G/S. Patient with limited mouth opening and grade 2 view with G/S 3 blade. Dentition and oral mucosa as per preop.

## 2023-11-15 NOTE — H&P (Signed)
 Electrophysiology Office Follow up Visit Note:     Date:  11/15/2023    ID:  Lisa Moore, DOB 1951/09/11, MRN [id number]   PCP:  Tollie Eth, NP          [facility] Cardiologist:  Olga Millers, MD  [facility] Electrophysiologist:  Lanier Prude, MD      Interval History:       Lisa Moore is a 73 y.o. female who presents for a follow up visit.    The patient last all [place] in the A-fib clinic August 25, 2023.  She has a history of persistent atrial fibrillation on Eliquis.  She had a prior A-fib ablation with Dr. Johney Frame on Jan 13, 2021.  She has been on flecainide in the past.  She currently takes amiodarone for rhythm control and noted an improved burden while taking this medication.  She is on Coumadin currently. Her INRs have been subtherapeutic the last 5 checks.   During her Jan 13, 2021 catheter ablation, the pulmonary veins, posterior wall and CTI were ablated.   Today she reports intermittent episodes of AF despite treatment with Amiodarone. HR are typically 115-120 while in AF although she had episodes with rates > 150 in the past. She takes amiodarone without known side effect. She is on coumadin and her INR is drifting upwards. She reports the costs a/w Eliquis were prohibitive in the past.  Presents for AF ablation today. Procedure reviewed.  Objective Past medical, surgical, social and family history were reviewed.   ROS:   Please see the history of present illness.    All other systems reviewed and are negative.   EKGs/Labs/Other Studies Reviewed:     The following studies were reviewed today:     INR checks from December 2024 to now are all the subtherapeutic   EKG Interpretation Date/Time:                  Monday September 26 2023 16:12:30 EST Ventricular Rate:         65 PR Interval:                 182 QRS Duration:             82 QT Interval:                 446 QTC Calculation:463 R Axis:                         47   Text  Interpretation:Sinus rhythm with Premature atrial complexes Confirmed by Steffanie Dunn [phone]) on 09/26/2023 4:17:01 PM     Physical Exam:     VS:  BP 172/92 (BP Location: Left Arm, Patient Position: Sitting, Cuff Size: Large)   Pulse 66   Ht 5\' 5"  (1.651 m)   Wt 197 lb 6.4 oz (89.5 kg)   LMP  (LMP Unknown)   SpO2 95%   BMI 32.85 kg/m         Wt Readings from Last 3 Encounters:  09/26/23 197 lb 6.4 oz (89.5 kg)  08/25/23 199 lb 3.2 oz (90.4 kg)  08/02/23 197 lb 8.5 oz (89.6 kg)      GEN: no distress CARD: RRR, No MRG RESP: No IWOB. CTAB.     Assessment ASSESSMENT:     1. Persistent atrial fibrillation (HCC)   2. H/O atrial flutter   3. Encounter for long-term (current) use of high-risk medication  PLAN:     In order of problems listed above:   #Persistent atrial fibrillation #Difficult to control Coumadin/INR The patient has symptomatic atrial fibrillation despite prior catheter ablation in 2022 with Dr. Johney Frame.  She was previously on flecainide but this was ineffective.  She is now on amiodarone.  We discussed treatment strategies for her persistent atrial fibrillation including continued antiarrhythmic drugs, alternative antiarrhythmic drugs and redo catheter ablation.  The patient is interested in pursuing redo catheter ablation.   We also discussed her stroke risk reduction strategy.  She is on Coumadin but is having difficult to control INR's.  We discussed alternative anticoagulants and left atrial appendage occlusion.  Given her relatively low CHA2DS2-VASc score of 2, she is not currently a candidate for left atrial appendage occlusion.     Needs updated CMP, TSH and free T4 today given amiodarone use.     -------------   Discussed treatment options today for AF including antiarrhythmic drug therapy and ablation. Discussed risks, recovery and likelihood of success with each treatment strategy. Risk, benefits, and alternatives to EP study and ablation for  afib were discussed. These risks include but are not limited to stroke, bleeding, vascular damage, tamponade, perforation, damage to the esophagus, lungs, phrenic nerve and other structures, pulmonary vein stenosis, worsening renal function, coronary vasospasm and death.  Discussed potential need for repeat ablation procedures and antiarrhythmic drugs after an initial ablation. The patient understands these risk and wishes to proceed.  We will therefore proceed with catheter ablation at the next available time.  Carto, ICE, anesthesia are requested for the procedure.  Will also obtain CT PV protocol prior to the procedure to exclude LAA thrombus and further evaluate atrial anatomy.   Presents for AF ablation today. Procedure reviewed.   Signed, Steffanie Dunn, MD, [facility], [facility] 11/15/2023 Electrophysiology [facility]

## 2023-11-16 ENCOUNTER — Telehealth (HOSPITAL_COMMUNITY): Payer: Self-pay

## 2023-11-16 ENCOUNTER — Encounter (HOSPITAL_COMMUNITY): Payer: Self-pay | Admitting: Cardiology

## 2023-11-16 MED FILL — Fentanyl Citrate Preservative Free (PF) Inj 100 MCG/2ML: INTRAMUSCULAR | Qty: 2 | Status: AC

## 2023-11-16 NOTE — Telephone Encounter (Signed)
 Spoke with patient to complete post procedure follow up call.  Patient reports no complications with groin sites.   Instructions reviewed with patient:  Remove large bandage at puncture site after 24 hours. It is normal to have bruising, tenderness and a pea or marble sized lump/knot at the groin site which can take up to three months to resolve.  Get help right away if you notice sudden swelling at the puncture site.  Check your puncture site every day for signs of infection: fever, redness, swelling, pus drainage, warmth, foul odor or excessive pain. If this occurs, please call the office at [phone], to speak with the nurse. Get help right away if your puncture site is bleeding and the bleeding does not stop after applying firm pressure to the area.  You may continue to have skipped beats/ atrial fibrillation during the first several months after your procedure.  It is very important not to miss any doses of your blood thinner Warfarin. Patient restarted taking this medication on yesterday, 11/15/23.   You will follow up with the Afib clinic on 12/13/23 and follow up with the APP on 02/15/24.   Patient verbalized understanding to all instructions provided.

## 2023-11-16 NOTE — Anesthesia Postprocedure Evaluation (Signed)
 Anesthesia Post Note  Patient: Lisa Moore  Procedure(s) Performed: ATRIAL FIBRILLATION ABLATION TRANSESOPHAGEAL ECHOCARDIOGRAM     Patient location during evaluation: PACU Anesthesia Type: General Level of consciousness: awake and alert Pain management: pain level controlled Vital Signs Assessment: post-procedure vital signs reviewed and stable Respiratory status: spontaneous breathing, nonlabored ventilation, respiratory function stable and patient connected to nasal cannula oxygen Cardiovascular status: blood pressure returned to baseline and stable Postop Assessment: no apparent nausea or vomiting Anesthetic complications: no   There were no known notable events for this encounter.  Last Vitals:  Vitals:   11/15/23 1815 11/15/23 1830  BP: 121/82   Pulse: 62 66  Resp: (!) 23 (!) 24  Temp:    SpO2: 93% 94%    Last Pain:  Vitals:   11/15/23 1555  TempSrc:   PainSc: 0-No pain                 Franco Duley S

## 2023-11-17 ENCOUNTER — Ambulatory Visit: Payer: Medicare Other

## 2023-11-17 ENCOUNTER — Other Ambulatory Visit (HOSPITAL_BASED_OUTPATIENT_CLINIC_OR_DEPARTMENT_OTHER): Payer: Self-pay

## 2023-11-23 ENCOUNTER — Ambulatory Visit

## 2023-11-23 DIAGNOSIS — I4819 Other persistent atrial fibrillation: Secondary | ICD-10-CM | POA: Diagnosis not present

## 2023-11-23 LAB — POCT INR: INR: 2.1 (ref 2.0–3.0)

## 2023-11-23 NOTE — Patient Instructions (Signed)
 Description   Take 2.5 tablets today and then continue taking warfarin 2 tablets (5mg ) daily.  Recheck INR in 2 weeks.    Anticoagulation Clinic [phone] Amio 1 tablet 200 mg daily

## 2023-12-07 ENCOUNTER — Ambulatory Visit: Attending: Cardiology | Admitting: *Deleted

## 2023-12-07 DIAGNOSIS — Z5181 Encounter for therapeutic drug level monitoring: Secondary | ICD-10-CM | POA: Diagnosis not present

## 2023-12-07 DIAGNOSIS — I4819 Other persistent atrial fibrillation: Secondary | ICD-10-CM | POA: Insufficient documentation

## 2023-12-07 LAB — POCT INR: INR: 2.8 (ref 2.0–3.0)

## 2023-12-07 NOTE — Patient Instructions (Signed)
 Description   Continue taking warfarin 2 tablets (5mg ) daily.  Recheck INR in 3 weeks.    Anticoagulation Clinic [phone] Amio 1 tablet 200 mg daily

## 2023-12-09 ENCOUNTER — Ambulatory Visit (HOSPITAL_COMMUNITY): Payer: Medicare Other | Admitting: Internal Medicine

## 2023-12-13 ENCOUNTER — Ambulatory Visit (HOSPITAL_COMMUNITY)
Admission: RE | Admit: 2023-12-13 | Discharge: 2023-12-13 | Disposition: A | Discharge status: Home / Self Care | Payer: Medicare Other | Source: Ambulatory Visit | Attending: Internal Medicine | Admitting: Internal Medicine

## 2023-12-13 VITALS — BP 130/82 | HR 73 | Ht 65.0 in | Wt 199.6 lb

## 2023-12-13 DIAGNOSIS — I4892 Unspecified atrial flutter: Secondary | ICD-10-CM | POA: Diagnosis not present

## 2023-12-13 DIAGNOSIS — I3481 Nonrheumatic mitral (valve) annulus calcification: Secondary | ICD-10-CM | POA: Diagnosis not present

## 2023-12-13 DIAGNOSIS — Z5181 Encounter for therapeutic drug level monitoring: Secondary | ICD-10-CM | POA: Insufficient documentation

## 2023-12-13 DIAGNOSIS — I4819 Other persistent atrial fibrillation: Secondary | ICD-10-CM | POA: Insufficient documentation

## 2023-12-13 DIAGNOSIS — I340 Nonrheumatic mitral (valve) insufficiency: Secondary | ICD-10-CM | POA: Insufficient documentation

## 2023-12-13 DIAGNOSIS — Z79899 Other long term (current) drug therapy: Secondary | ICD-10-CM | POA: Diagnosis not present

## 2023-12-13 DIAGNOSIS — Z7901 Long term (current) use of anticoagulants: Secondary | ICD-10-CM | POA: Diagnosis not present

## 2023-12-13 NOTE — Progress Notes (Signed)
 Primary Care Physician: Tollie Eth, NP Primary Cardiologist: Dr Jens Som  Primary Electrophysiologist: Dr Lalla Brothers Referring Physician: Kriste Basque Dunn/Dr Dillard Cannon Forgy is a 73 y.o. female with a history of persistent atrial fibrillation who presents for follow up in the [facility].     The patient was initially diagnosed with atrial fibrillation 05/20/20 after presenting to her PCP office with migraine symptoms. She was found incidentally to be in afib with RVR and was sent to the ED. She was admitted and started on Eliquis for a CHADS2VASC score of 2 and diltiazem for rate control. She was asymptomatic with her arrhythmia. Echo done during her admission showed normal LVEF, moderate LA dilation, degenerative MV with mild MR. Patient is s/p DCCV on 07/07/20. Patient is s/p DCCV on 11/07/20 after starting flecainide but had atrial flutter on follow up. She is s/p afib and flutter ablation with Dr Johney Frame on 01/13/21.   On follow up 12/13/23, patient is currently in NSR. S/p Afib ablation on 11/15/23 by Dr. Lalla Brothers. No episodes of Afib since ablation. She is taking amiodarone 200 mg daily. No chest pain or SOB. Leg sites healed without issue. She is on coumadin.  Today, she denies symptoms of orthopnea, PND, lower extremity edema, dizziness, presyncope, syncope, snoring, daytime somnolence, bleeding, or neurologic sequela. The patient is tolerating medications without difficulties and is otherwise without complaint today.    Atrial Fibrillation Risk Factors:  she does not have symptoms or diagnosis of sleep apnea. Sleep study 11/30/20 negative.  she does not have a history of rheumatic fever. she does not have a history of alcohol use. The patient does not have a history of early familial atrial fibrillation or other arrhythmias.   Atrial Fibrillation Management history:  Previous antiarrhythmic drugs: flecainide Previous cardioversions: 07/07/20,  11/07/20 Previous ablations: 01/13/21, 11/15/23 Anticoagulation history: Eliquis, warfarin   Past Medical History:  Diagnosis Date   Arthritis    Benign hematuria    GERD (gastroesophageal reflux disease)    Medicare annual wellness visit, subsequent 08/12/2022   Medicare annual wellness visit, subsequent 08/12/2022   Migraines    rare   Myocardial infarction [facility]) 2022   Ablation 2022   Persistent atrial fibrillation (HCC) 05/21/2020   Typical atrial flutter (HCC) 11/13/2020   ROS- All systems are reviewed and negative except as per the HPI above.  Physical Exam: Vitals:   12/13/23 1124  BP: 130/82  Pulse: 73  Weight: 90.5 kg  Height: 5\' 5"  (1.651 m)    GEN- The patient is well appearing, alert and oriented x 3 today.   Neck - no JVD or carotid bruit noted Lungs- Clear to ausculation bilaterally, normal work of breathing Heart- Regular rate and rhythm, no murmurs, rubs or gallops, PMI not laterally displaced Extremities- no clubbing, cyanosis, or edema Skin - no rash or ecchymosis noted   Wt Readings from Last 3 Encounters:  12/13/23 90.5 kg  11/15/23 88.5 kg  09/26/23 89.5 kg    EKG today demonstrates Vent. rate 73 BPM PR interval 206 ms QRS duration 78 ms QT/QTcB 418/460 ms P-R-T axes 81 45 53 Normal sinus rhythm Normal ECG When compared with ECG of 15-Nov-2023 15:12, PREVIOUS ECG IS PRESENT   Echo 05/21/20 demonstrated  1. Left ventricular ejection fraction, by estimation, is 55 to 60%. The  left ventricle has normal function. The left ventricle has no regional  wall motion abnormalities. Left ventricular diastolic parameters are  indeterminate.  2. Right ventricular systolic function is normal. The right ventricular  size is normal.   3. Left atrial size was moderately dilated.   4. The mitral valve is degenerative. Mild mitral valve regurgitation. No  evidence of mitral stenosis. Moderate mitral annular calcification.   5. The aortic valve is  tricuspid. Aortic valve regurgitation is not  visualized. No aortic stenosis is present.   6. The inferior vena cava is normal in size with greater than 50%  respiratory variability, suggesting right atrial pressure of 3 mmHg.   Epic records are reviewed at length today  CHA2DS2-VASc Score = 2  The patient's score is based upon: CHF History: 0 HTN History: 0 Diabetes History: 0 Stroke History: 0 Vascular Disease History: 0 Age Score: 1 Gender Score: 1       ASSESSMENT AND PLAN: Persistent Atrial Fibrillation/atrial flutter The patient's CHA2DS2-VASc score is 2, indicating a 2.2% annual risk of stroke.   S/p afib and flutter ablation 01/13/21. Failed flecainide  S/p Afib ablation on 11/15/23 by Dr. Lalla Brothers.  She is currently in NSR. Continue diltiazem 240 mg daily.  High risk medication monitoring (ICD10: [id number]) Patient requires ongoing monitoring for anti-arrhythmic medication which has the potential to cause life threatening arrhythmias or AV block. Qtc stable. Continue amiodarone 200 mg daily. Continue coumadin as directed. We did discuss due to low risk score she is able to discontinue anticoagulation in the future if she wishes.    Follow up with EP as scheduled.    Justin Mend, PA-C Afib Clinic [facility] [address] [place], [place] [address] [phone] 12/13/2023 2:01 PM

## 2023-12-28 ENCOUNTER — Ambulatory Visit: Attending: Cardiology

## 2023-12-28 DIAGNOSIS — I4819 Other persistent atrial fibrillation: Secondary | ICD-10-CM | POA: Insufficient documentation

## 2023-12-28 LAB — POCT INR: INR: 3.5 — AB (ref 2.0–3.0)

## 2023-12-28 NOTE — Patient Instructions (Signed)
 Description   HOLD today's dose and then continue taking warfarin 2 tablets (5mg ) daily.  Recheck INR in 3 weeks.    Anticoagulation Clinic [phone] Amio 1 tablet 200 mg daily

## 2024-01-18 ENCOUNTER — Ambulatory Visit

## 2024-01-18 DIAGNOSIS — I4819 Other persistent atrial fibrillation: Secondary | ICD-10-CM

## 2024-01-18 LAB — POCT INR: INR: 2.3 (ref 2.0–3.0)

## 2024-01-18 NOTE — Patient Instructions (Signed)
 Description   Continue taking warfarin 2 tablets (5mg ) daily.  Recheck INR in 4 weeks.    Anticoagulation Clinic [phone] Amio 1 tablet 200 mg daily

## 2024-01-26 NOTE — CV Procedure (Signed)
   TRANSESOPHAGEAL ECHOCARDIOGRAM  NAME:  Lisa Moore    MRN: [id number] DOB:  [birth year]    ADMIT DATE: 11/15/2023  INDICATIONS: Symptomatic atrial fibrillation  PROCEDURE:   Informed consent was obtained prior to the procedure. The risks, benefits and alternatives for the procedure were discussed and the patient comprehended these risks.  Risks include, but are not limited to, cough, sore throat, vomiting, nausea, somnolence, esophageal and stomach trauma or perforation, bleeding, low blood pressure, aspiration, pneumonia, infection, trauma to the teeth and death.    After a procedural timeout, the patient was administered propofol  per anesthesia.  The patient's heart rate, blood pressure, and oxygen saturation were monitored continuously during the procedure.   The transesophageal probe was inserted in the esophagus and stomach without difficulty and multiple views were obtained.  The patient was kept under observation until the patient left the procedure room.  The patient left the procedure room in stable condition.    COMPLICATIONS:    Complications: No complications.  The patient had normal neuro status and respiratory status post procedure with vitals stable as recorded elsewhere.  Adequate airway was maintained throughout and vital signs monitored per protocol.  KEY FINDINGS:  No LAA thrombus. Full Report to follow.   Arta Lark Advanced Heart Failure 2:23 PM

## 2024-01-31 LAB — ECHO TEE

## 2024-02-10 ENCOUNTER — Ambulatory Visit: Payer: Medicare Other | Admitting: Student

## 2024-02-15 ENCOUNTER — Ambulatory Visit: Payer: Medicare Other | Admitting: Student

## 2024-02-15 ENCOUNTER — Ambulatory Visit: Attending: Cardiology | Admitting: Pharmacist

## 2024-02-15 ENCOUNTER — Other Ambulatory Visit (HOSPITAL_BASED_OUTPATIENT_CLINIC_OR_DEPARTMENT_OTHER): Payer: Self-pay

## 2024-02-15 ENCOUNTER — Encounter: Payer: Self-pay | Admitting: Student

## 2024-02-15 VITALS — BP 136/82 | HR 62 | Ht 65.0 in | Wt 195.0 lb

## 2024-02-15 DIAGNOSIS — Z79899 Other long term (current) drug therapy: Secondary | ICD-10-CM | POA: Insufficient documentation

## 2024-02-15 DIAGNOSIS — I4819 Other persistent atrial fibrillation: Secondary | ICD-10-CM | POA: Insufficient documentation

## 2024-02-15 DIAGNOSIS — I10 Essential (primary) hypertension: Secondary | ICD-10-CM | POA: Insufficient documentation

## 2024-02-15 DIAGNOSIS — Z5181 Encounter for therapeutic drug level monitoring: Secondary | ICD-10-CM | POA: Insufficient documentation

## 2024-02-15 LAB — POCT INR: INR: 2.5 (ref 2.0–3.0)

## 2024-02-15 MED ORDER — METOPROLOL SUCCINATE ER 25 MG PO TB24
25.0000 mg | ORAL_TABLET | Freq: Every day | ORAL | 3 refills | Status: AC
  Filled 2024-02-15: qty 90, 90d supply, fill #0
  Filled 2024-05-14: qty 90, 90d supply, fill #1
  Filled 2024-08-15: qty 90, 90d supply, fill #2

## 2024-02-15 NOTE — Progress Notes (Signed)
  Electrophysiology Office Note:   Date:  02/15/2024  ID:  Lisa Moore, DOB 1950-12-26, MRN [id number]  Primary Cardiologist: Alexandria Angel, MD Electrophysiologist: Boyce Byes, MD      History of Present Illness:   Lisa Moore is a 73 y.o. female with h/o HTN, atrial fib, and atrial flutter seen today for routine electrophysiology follow-up s/p Ablation.  Since last being seen in our clinic the patient reports doing very well. Overall, she denies chest pain, palpitations, dyspnea, PND, orthopnea, nausea, vomiting, dizziness, syncope, edema, weight gain, or early satiety.    Review of systems complete and found to be negative unless listed in HPI.   EP Information / Studies Reviewed:    EKG is ordered today. Personal review as below.  EKG Interpretation Date/Time:  Wednesday February 15 2024 11:38:08 EDT Ventricular Rate:  62 PR Interval:  196 QRS Duration:  78 QT Interval:  432 QTC Calculation: 438 R Axis:   42  Text Interpretation: Normal sinus rhythm with sinus arrhythmia Low voltage QRS When compared with ECG of 13-Dec-2023 11:31, No significant change was found Confirmed by Pilar Bridge [phone]) on 02/15/2024 11:42:42 AM    Arrhythmia/Device History S/p PVI and CTI 01/2021 S/p redo PVI with PFA, posterior ablation, and atypical atrial flutter 11/2023   Physical Exam:   VS:  BP 136/82 (BP Location: Right Arm, Patient Position: Sitting, Cuff Size: Normal)   Pulse 62   Ht 5\' 5"  (1.651 m)   Wt 195 lb (88.5 kg)   LMP  (LMP Unknown)   SpO2 96%   BMI 32.45 kg/m    Wt Readings from Last 3 Encounters:  02/15/24 195 lb (88.5 kg)  12/13/23 199 lb 9.6 oz (90.5 kg)  11/15/23 195 lb (88.5 kg)     GEN: No acute distress NECK: No JVD; No carotid bruits CARDIAC: Regular rate and rhythm, no murmurs, rubs, gallops RESPIRATORY:  Clear to auscultation without rales, wheezing or rhonchi  ABDOMEN: Soft, non-tender, non-distended EXTREMITIES:  No edema; No deformity    ASSESSMENT AND PLAN:    Persistent atrial fib Paroxysmal atrial flutter S/p ablation 2022 and redo 11/2023 Failed flecainide  Currently on amiodarone  200 mg daily  Continue diltiazem  240 mg daily. Continue coumadin  for CHA2DS2VASc of at least 3. Next year, will increase to 4 at 73 yo.  She is interested in [place] [facility], but not in a rush. Wants to see Dr. Marven Slimmer at 6 months to discuss.   HTN Stable on current regimen     Follow up with Dr. Marven Slimmer in 6 months  Signed, Tylene Galla, PA-C

## 2024-02-15 NOTE — Patient Instructions (Signed)
 Description   Continue taking warfarin 2 tablets (5mg ) daily.  Recheck INR in 5 weeks.    Anticoagulation Clinic [phone] Amio 1 tablet 200 mg daily

## 2024-02-15 NOTE — Patient Instructions (Signed)
 Medication Instructions:  STOP amiodarone  STOP diltiazem  START Metoprolol  25 mg at bedtime.   *If you need a refill on your cardiac medications before your next appointment, please call your pharmacy*  Lab Work: No lab work today If you have labs (blood work) drawn today and your tests are completely normal, you will receive your results only by: MyChart Message (if you have MyChart) OR A paper copy in the mail If you have any lab test that is abnormal or we need to change your treatment, we will call you to review the results.  Testing/Procedures: No testing/procedures were scheduled today  Follow-Up: At [facility], you and your health needs are our priority.  As part of our continuing mission to provide you with exceptional heart care, our providers are all part of one team.  This team includes your primary Cardiologist (physician) and Advanced Practice Providers or APPs (Physician Assistants and Nurse Practitioners) who all work together to provide you with the care you need, when you need it.  Your next appointment:   6 month(s)  Provider:   You may see Boyce Byes, MD or one of the following Advanced Practice Providers on your designated Care Team:   Mertha Abrahams, [place] Bambi Lever "Jonelle Neri" [place], PA-C Suzann Riddle, NP Creighton Doffing, NP    We recommend signing up for the patient portal called "MyChart".  Sign up information is provided on this After Visit Summary.  MyChart is used to connect with patients for Virtual Visits (Telemedicine).  Patients are able to view lab/test results, encounter notes, upcoming appointments, etc.  Non-urgent messages can be sent to your provider as well.   To learn more about what you can do with MyChart, go to [email or web address].

## 2024-03-04 ENCOUNTER — Other Ambulatory Visit: Payer: Self-pay | Admitting: Cardiology

## 2024-03-04 DIAGNOSIS — I4819 Other persistent atrial fibrillation: Secondary | ICD-10-CM

## 2024-03-05 ENCOUNTER — Other Ambulatory Visit (HOSPITAL_BASED_OUTPATIENT_CLINIC_OR_DEPARTMENT_OTHER): Payer: Self-pay

## 2024-03-05 MED ORDER — WARFARIN SODIUM 2.5 MG PO TABS
ORAL_TABLET | ORAL | 3 refills | Status: DC
  Filled 2024-03-05: qty 65, 33d supply, fill #0
  Filled 2024-04-12: qty 65, 33d supply, fill #1
  Filled 2024-05-18: qty 65, 33d supply, fill #2
  Filled 2024-06-19: qty 65, 33d supply, fill #3

## 2024-03-21 ENCOUNTER — Ambulatory Visit: Attending: Cardiology | Admitting: *Deleted

## 2024-03-21 DIAGNOSIS — Z79899 Other long term (current) drug therapy: Secondary | ICD-10-CM | POA: Insufficient documentation

## 2024-03-21 DIAGNOSIS — I4819 Other persistent atrial fibrillation: Secondary | ICD-10-CM | POA: Diagnosis not present

## 2024-03-21 DIAGNOSIS — Z5181 Encounter for therapeutic drug level monitoring: Secondary | ICD-10-CM | POA: Diagnosis not present

## 2024-03-21 LAB — POCT INR: INR: 2.4 (ref 2.0–3.0)

## 2024-03-21 NOTE — Progress Notes (Signed)
Please see anticoagulation encounter.

## 2024-03-21 NOTE — Patient Instructions (Addendum)
 Description   Continue taking warfarin 2 tablets (5mg ) daily.  Recheck INR in 6 weeks.    Anticoagulation Clinic [phone] Amio 1 tablet 200 mg daily

## 2024-05-02 ENCOUNTER — Ambulatory Visit: Attending: Cardiology

## 2024-05-02 DIAGNOSIS — I4819 Other persistent atrial fibrillation: Secondary | ICD-10-CM | POA: Diagnosis not present

## 2024-05-02 LAB — POCT INR: INR: 2.6 (ref 2.0–3.0)

## 2024-05-02 NOTE — Patient Instructions (Signed)
 Description   Continue taking warfarin 2 tablets (5mg ) daily.  Recheck INR in 6 weeks.    Anticoagulation Clinic [phone] Amio 1 tablet 200 mg daily

## 2024-05-02 NOTE — Progress Notes (Signed)
 INR 2.6; Please see anticoagulation encounter

## 2024-06-13 ENCOUNTER — Ambulatory Visit: Attending: Cardiology | Admitting: *Deleted

## 2024-06-13 DIAGNOSIS — I4819 Other persistent atrial fibrillation: Secondary | ICD-10-CM | POA: Insufficient documentation

## 2024-06-13 DIAGNOSIS — Z5181 Encounter for therapeutic drug level monitoring: Secondary | ICD-10-CM | POA: Insufficient documentation

## 2024-06-13 LAB — POCT INR: INR: 3.7 — AB (ref 2.0–3.0)

## 2024-06-13 NOTE — Patient Instructions (Signed)
 Description   INR-3.7; Do not take any warfarin tomorrow then continue taking warfarin 2 tablets (5mg ) daily.  Recheck INR in 5 weeks (6 weeks).    Anticoagulation Clinic [phone] Amio 1 tablet 200 mg daily

## 2024-06-13 NOTE — Progress Notes (Signed)
 Description   INR-3.7; Do not take any warfarin tomorrow then continue taking warfarin 2 tablets (5mg ) daily.  Recheck INR in 5 weeks (6 weeks).    Anticoagulation Clinic [phone] Amio 1 tablet 200 mg daily

## 2024-07-04 ENCOUNTER — Other Ambulatory Visit (HOSPITAL_BASED_OUTPATIENT_CLINIC_OR_DEPARTMENT_OTHER): Payer: Self-pay

## 2024-07-04 DIAGNOSIS — Z23 Encounter for immunization: Secondary | ICD-10-CM | POA: Diagnosis not present

## 2024-07-04 MED ORDER — COMIRNATY 30 MCG/0.3ML IM SUSY
0.3000 mL | PREFILLED_SYRINGE | Freq: Once | INTRAMUSCULAR | 0 refills | Status: AC
  Filled 2024-07-04: qty 0.3, 1d supply, fill #0

## 2024-07-04 MED ORDER — FLUZONE HIGH-DOSE 0.5 ML IM SUSY
0.5000 mL | PREFILLED_SYRINGE | Freq: Once | INTRAMUSCULAR | 0 refills | Status: AC
  Filled 2024-07-04: qty 0.5, 1d supply, fill #0

## 2024-07-18 ENCOUNTER — Ambulatory Visit: Attending: Cardiology | Admitting: Pharmacist

## 2024-07-18 DIAGNOSIS — Z5181 Encounter for therapeutic drug level monitoring: Secondary | ICD-10-CM | POA: Insufficient documentation

## 2024-07-18 DIAGNOSIS — I4819 Other persistent atrial fibrillation: Secondary | ICD-10-CM | POA: Insufficient documentation

## 2024-07-18 DIAGNOSIS — Z79899 Other long term (current) drug therapy: Secondary | ICD-10-CM | POA: Insufficient documentation

## 2024-07-18 LAB — POCT INR: INR: 1.6 — AB (ref 2.0–3.0)

## 2024-07-18 NOTE — Patient Instructions (Signed)
 Description   INR-1.6; Take 3 tablets today and then continue taking warfarin 2 tablets (5mg ) daily.  Recheck INR in 5 weeks (6 weeks).    Anticoagulation Clinic [phone] Amio 1 tablet 200 mg daily

## 2024-07-18 NOTE — Progress Notes (Signed)
 Description   INR-1.6; Take 3 tablets today and then continue taking warfarin 2 tablets (5mg ) daily.  Recheck INR in 5 weeks (6 weeks).    Anticoagulation Clinic [phone] Amio 1 tablet 200 mg daily

## 2024-07-20 ENCOUNTER — Other Ambulatory Visit: Payer: Self-pay

## 2024-07-20 ENCOUNTER — Other Ambulatory Visit: Payer: Self-pay | Admitting: Cardiology

## 2024-07-20 ENCOUNTER — Other Ambulatory Visit (HOSPITAL_BASED_OUTPATIENT_CLINIC_OR_DEPARTMENT_OTHER): Payer: Self-pay

## 2024-07-20 DIAGNOSIS — I4819 Other persistent atrial fibrillation: Secondary | ICD-10-CM

## 2024-07-20 MED ORDER — WARFARIN SODIUM 2.5 MG PO TABS
ORAL_TABLET | ORAL | 1 refills | Status: DC
  Filled 2024-07-20: qty 60, 30d supply, fill #0
  Filled 2024-08-18: qty 60, 30d supply, fill #1

## 2024-08-08 ENCOUNTER — Ambulatory Visit: Attending: Cardiology | Admitting: Pharmacist

## 2024-08-08 DIAGNOSIS — Z5181 Encounter for therapeutic drug level monitoring: Secondary | ICD-10-CM | POA: Diagnosis not present

## 2024-08-08 DIAGNOSIS — I4819 Other persistent atrial fibrillation: Secondary | ICD-10-CM | POA: Insufficient documentation

## 2024-08-08 DIAGNOSIS — Z79899 Other long term (current) drug therapy: Secondary | ICD-10-CM | POA: Insufficient documentation

## 2024-08-08 LAB — POCT INR: INR: 1.9 — AB (ref 2.0–3.0)

## 2024-08-08 NOTE — Progress Notes (Signed)
 Description   INR-1.9; Take 3 tablets today and take 3 tablets on Friday and then continue taking warfarin 2 tablets (5mg ) daily.  Recheck INR in 3 weeks (6 weeks).    Anticoagulation Clinic [phone] Amio 1 tablet 200 mg daily

## 2024-08-08 NOTE — Patient Instructions (Signed)
 Description   INR-1.9; Take 3 tablets today and take 3 tablets on Friday and then continue taking warfarin 2 tablets (5mg ) daily.  Recheck INR in 3 weeks (6 weeks).    Anticoagulation Clinic [phone] Amio 1 tablet 200 mg daily

## 2024-08-21 ENCOUNTER — Ambulatory Visit: Attending: Student

## 2024-08-21 ENCOUNTER — Other Ambulatory Visit: Payer: Self-pay

## 2024-08-21 DIAGNOSIS — I4819 Other persistent atrial fibrillation: Secondary | ICD-10-CM

## 2024-08-21 NOTE — Progress Notes (Signed)
 7 day Zio order per Andy's request.

## 2024-08-21 NOTE — Progress Notes (Unsigned)
 Enrolled for [employer] to mail a ZIO XT long term holter monitor to the patients address on file.   Dr. Lalla Brothers to read.

## 2024-08-22 NOTE — Telephone Encounter (Signed)
 Hi Lisa Moore wants you to wear a heart monitor for 7 days and then follow up on the results at your January appointment. Below are the instructions for the monitor. Please reach out with any questions or concerns.  Thank you,   Andrez, LPN  Heart[facility] Triage        ZIO XT- Long Term Monitor Instructions  Your physician has requested you wear a ZIO patch monitor for 7 days.   This is a single patch monitor. [employer] supplies one patch monitor per enrollment. Additional  stickers are not available. Please do not apply patch if you will be having a Nuclear Stress Test,  Echocardiogram, Cardiac CT, MRI, or Chest Xray during the period you would be wearing the  monitor. The patch cannot be worn during these tests. You cannot remove and re-apply the  ZIO XT patch monitor.   Your ZIO patch monitor will be mailed 3 day USPS to your address on file. It may take 3-5 days  to receive your monitor after you have been enrolled.   Once you have received your monitor, please review the enclosed instructions. Your monitor  has already been registered assigning a specific monitor serial # to you.     Billing and Patient Assistance Program Information  We have supplied [employer] with any of your insurance information on file for billing purposes.  [employer] offers a sliding scale Patient Assistance Program for patients that do not have  insurance, or whose insurance does not completely cover the cost of the ZIO monitor.  You must apply for the Patient Assistance Program to qualify for this discounted rate.   To apply, please call [employer] at [phone], select option 4, select option 2, ask to apply for  Patient Assistance Program. [name] will ask your household income, and how many people  are in your household. They will quote your out-of-pocket cost based on that information.  [employer] will also be able to set up a 35-month, interest-free payment plan if needed.     Applying the monitor   Shave hair from upper left chest.  Hold abrader disc by orange tab. Rub abrader in 40 strokes over the upper left chest as  indicated in your monitor instructions.  Clean area with 4 enclosed alcohol pads. Let dry.  Apply patch as indicated in monitor instructions. Patch will be placed under collarbone on left  side of chest with arrow pointing upward.  Rub patch adhesive wings for 2 minutes. Remove white label marked 1. Remove the white  label marked 2. Rub patch adhesive wings for 2 additional minutes.  While looking in a mirror, press and release button in center of patch. A small green light will  flash 3-4 times. This will be your only indicator that the monitor has been turned on.  Do not shower for the first 24 hours. You may shower after the first 24 hours.  Press the button if you feel a symptom. You will hear a small click. Record Date, Time and  Symptom in the Patient Logbook.  When you are ready to remove the patch, follow instructions on the last 2 pages of Patient  Logbook. Stick patch monitor onto the last page of Patient Logbook.   Place Patient Logbook in the blue and white box. Use locking tab on box and tape box closed  securely. The blue and white box has prepaid postage on it. Please place it in the mailbox as  soon as possible. Your physician should have  your test results approximately 7 days after the  monitor has been mailed back to [facility].   Call [facility] Customer Care at [phone] if you have questions regarding  your ZIO XT patch monitor. Call them immediately if you see an orange light blinking on your  monitor.   If your monitor falls off in less than 4 days, contact our Monitor department at [phone].   If your monitor becomes loose or falls off after 4 days call [employer] at [phone] for  suggestions on securing your monitor.

## 2024-08-29 ENCOUNTER — Other Ambulatory Visit (HOSPITAL_BASED_OUTPATIENT_CLINIC_OR_DEPARTMENT_OTHER): Payer: Self-pay

## 2024-08-29 ENCOUNTER — Ambulatory Visit

## 2024-08-29 ENCOUNTER — Other Ambulatory Visit: Payer: Self-pay | Admitting: *Deleted

## 2024-08-29 DIAGNOSIS — I4819 Other persistent atrial fibrillation: Secondary | ICD-10-CM | POA: Diagnosis present

## 2024-08-29 DIAGNOSIS — Z79899 Other long term (current) drug therapy: Secondary | ICD-10-CM | POA: Diagnosis present

## 2024-08-29 DIAGNOSIS — Z5181 Encounter for therapeutic drug level monitoring: Secondary | ICD-10-CM | POA: Insufficient documentation

## 2024-08-29 LAB — POCT INR: INR: 1.8 — AB (ref 2.0–3.0)

## 2024-08-29 MED ORDER — WARFARIN SODIUM 2.5 MG PO TABS
5.0000 mg | ORAL_TABLET | Freq: Every day | ORAL | 3 refills | Status: DC
  Filled 2024-08-29: qty 65, fill #0
  Filled 2024-08-30 – 2024-09-12 (×2): qty 65, 21d supply, fill #0

## 2024-08-29 NOTE — Progress Notes (Signed)
 Description   INR-1.8; Take 3 tablets today the START taking warfarin 2 tablets (5mg ) daily except 3 tablets (7.5mg ) on Sundays.  Recheck INR in 3 weeks (6 weeks).    Anticoagulation Clinic [phone] Amio 1 tablet 200 mg daily

## 2024-08-29 NOTE — Patient Instructions (Signed)
 Description   INR-1.8; Take 3 tablets today the START taking warfarin 2 tablets (5mg ) daily except 3 tablets (7.5mg ) on Sundays.  Recheck INR in 3 weeks (6 weeks).    Anticoagulation Clinic [phone] Amio 1 tablet 200 mg daily

## 2024-08-29 NOTE — Telephone Encounter (Signed)
 Warfarin 2.5mg  Dx-Afib Last INR Check-08/29/24 Last OV- 02/15/24

## 2024-08-30 ENCOUNTER — Other Ambulatory Visit (HOSPITAL_BASED_OUTPATIENT_CLINIC_OR_DEPARTMENT_OTHER): Payer: Self-pay

## 2024-09-12 ENCOUNTER — Other Ambulatory Visit (HOSPITAL_BASED_OUTPATIENT_CLINIC_OR_DEPARTMENT_OTHER): Payer: Self-pay

## 2024-09-12 ENCOUNTER — Other Ambulatory Visit (HOSPITAL_BASED_OUTPATIENT_CLINIC_OR_DEPARTMENT_OTHER): Payer: Self-pay | Admitting: Nurse Practitioner

## 2024-09-12 DIAGNOSIS — Z1231 Encounter for screening mammogram for malignant neoplasm of breast: Secondary | ICD-10-CM

## 2024-09-18 ENCOUNTER — Ambulatory Visit: Admitting: Student

## 2024-09-18 ENCOUNTER — Encounter: Payer: Self-pay | Admitting: Student

## 2024-09-18 ENCOUNTER — Ambulatory Visit (INDEPENDENT_AMBULATORY_CARE_PROVIDER_SITE_OTHER)

## 2024-09-18 VITALS — BP 144/84 | HR 77 | Ht 65.0 in | Wt 202.0 lb

## 2024-09-18 DIAGNOSIS — Z79899 Other long term (current) drug therapy: Secondary | ICD-10-CM | POA: Diagnosis present

## 2024-09-18 DIAGNOSIS — Z5181 Encounter for therapeutic drug level monitoring: Secondary | ICD-10-CM | POA: Insufficient documentation

## 2024-09-18 DIAGNOSIS — I10 Essential (primary) hypertension: Secondary | ICD-10-CM | POA: Insufficient documentation

## 2024-09-18 DIAGNOSIS — I4819 Other persistent atrial fibrillation: Secondary | ICD-10-CM | POA: Insufficient documentation

## 2024-09-18 LAB — POCT INR: INR: 1.6 — AB (ref 2.0–3.0)

## 2024-09-18 NOTE — Patient Instructions (Signed)
 Description   INR-1.6; Take 3 tablets today the START taking warfarin 2 tablets (5mg ) daily except 3 tablets (7.5mg ) on Sundays and Thursdays.  Recheck INR in 2 weeks (6 weeks).    Anticoagulation Clinic [phone] Amio 1 tablet 200 mg daily

## 2024-09-18 NOTE — Progress Notes (Signed)
" °  Electrophysiology Office Note:   Date:  09/18/2024  ID:  Lisa Moore, DOB 01-12-1951, MRN [id number]  Primary Cardiologist: Redell Shallow, MD Electrophysiologist: Fonda Kitty, MD   Electrophysiologist:  Fonda Kitty, MD      History of Present Illness:   Lisa Moore is a 74 y.o. female with h/o h/o HTN, atrial fib, and atrial flutter seen today for routine electrophysiology followup.   Since last being seen in our clinic the patient reports doing OK. She had palpitations in December and possible AF by her Crist. Were a monitor that showed brief SVT but no clear AF. Otherwise, she denies chest pain, palpitations, dyspnea, PND, orthopnea, nausea, vomiting, dizziness, syncope, edema, weight gain, or early satiety.  Her INR has also been labile. One high result followed by 3 low.   Review of systems complete and found to be negative unless listed in HPI.   EP Information / Studies Reviewed:    EKG is ordered today. Personal review as below.  EKG Interpretation Date/Time:  Tuesday September 18 2024 09:41:05 EST Ventricular Rate:  77 PR Interval:  184 QRS Duration:  74 QT Interval:  370 QTC Calculation: 418 R Axis:   65  Text Interpretation: Normal sinus rhythm with sinus arrhythmia Low voltage QRS When compared with ECG of 15-Feb-2024 11:38, No significant change was found Confirmed by Lesia Heck ([id number]) on 09/18/2024 9:45:29 AM    Arrhythmia/Device History S/p PVI and CTI 01/2021 S/p redo PVI with PFA, posterior ablation, and atypical atrial flutter 11/2023   Physical Exam:   VS:  BP (!) 144/84   Pulse 77   Ht 5' 5 (1.651 m)   Wt 202 lb (91.6 kg)   LMP  (LMP Unknown)   SpO2 96%   BMI 33.61 kg/m    Wt Readings from Last 3 Encounters:  09/18/24 202 lb (91.6 kg)  02/15/24 195 lb (88.5 kg)  12/13/23 199 lb 9.6 oz (90.5 kg)     GEN: No acute distress NECK: No JVD; No carotid bruits CARDIAC: Regular rate and rhythm, no murmurs, rubs, gallops RESPIRATORY:  Clear to  auscultation without rales, wheezing or rhonchi  ABDOMEN: Soft, non-tender, non-distended EXTREMITIES:  No edema; No deformity   ASSESSMENT AND PLAN:    Persistent atrial fib Paroxysmal atrial flutter S/p ablation 2022 and redo 11/2023 Failed flecainide  Off amiodarone  s/p redo ablation Continue toprol  25 mg qhs Continue coumadin  for CHA2DS2VASc of at least 3. Will increase to 3 on her birthday.  She remains interested in [place]. Will have See Dr. Kitty to discuss.   Xarelto/Eliquis  cost prohibitive.  She would be willing to consider pradaxa, especially short term if felt to be watchman candidate.    HTN Stable on current regimen   Follow up with Dr. Kitty to discuss Watchman  Signed, Ozell Prentice Lesia, PA-C  "

## 2024-09-18 NOTE — Patient Instructions (Addendum)
 Medication Instructions:  No medication changes today. *If you need a refill on your cardiac medications before your next appointment, please call your pharmacy*  Lab Work: No labwork ordered today. If you have labs (blood work) drawn today and your tests are completely normal, you will receive your results only by: MyChart Message (if you have MyChart) OR A paper copy in the mail If you have any lab test that is abnormal or we need to change your treatment, we will call you to review the results.  Testing/Procedures: No testing ordered today  Follow-Up: At [facility], you and your health needs are our priority.  As part of our continuing mission to provide you with exceptional heart care, our providers are all part of one team.  This team includes your primary Cardiologist (physician) and Advanced Practice Providers or APPs (Physician Assistants and Nurse Practitioners) who all work together to provide you with the care you need, when you need it.  Your next appointment:   Next available with Dr. Kennyth to talk about Watchman  Provider:   Fonda Kennyth, MD  We recommend signing up for the patient portal called MyChart.  Sign up information is provided on this After Visit Summary.  MyChart is used to connect with patients for Virtual Visits (Telemedicine).  Patients are able to view lab/test results, encounter notes, upcoming appointments, etc.  Non-urgent messages can be sent to your provider as well.   To learn more about what you can do with MyChart, go to [email or web address].

## 2024-09-18 NOTE — Progress Notes (Signed)
 Description   INR-1.6; Take 3 tablets today the START taking warfarin 2 tablets (5mg ) daily except 3 tablets (7.5mg ) on Sundays and Thursdays.  Recheck INR in 2 weeks (6 weeks).    Anticoagulation Clinic [phone] Amio 1 tablet 200 mg daily

## 2024-09-23 DIAGNOSIS — I4819 Other persistent atrial fibrillation: Secondary | ICD-10-CM | POA: Diagnosis not present

## 2024-09-24 ENCOUNTER — Ambulatory Visit: Payer: Self-pay | Admitting: Student

## 2024-10-02 ENCOUNTER — Other Ambulatory Visit: Payer: Self-pay | Admitting: *Deleted

## 2024-10-02 ENCOUNTER — Other Ambulatory Visit (HOSPITAL_BASED_OUTPATIENT_CLINIC_OR_DEPARTMENT_OTHER): Payer: Self-pay

## 2024-10-02 ENCOUNTER — Other Ambulatory Visit: Payer: Self-pay

## 2024-10-02 ENCOUNTER — Ambulatory Visit: Attending: Cardiology | Admitting: *Deleted

## 2024-10-02 DIAGNOSIS — Z5181 Encounter for therapeutic drug level monitoring: Secondary | ICD-10-CM | POA: Diagnosis present

## 2024-10-02 DIAGNOSIS — I4819 Other persistent atrial fibrillation: Secondary | ICD-10-CM | POA: Diagnosis present

## 2024-10-02 LAB — POCT INR: INR: 1.6 — AB (ref 2.0–3.0)

## 2024-10-02 MED ORDER — WARFARIN SODIUM 2.5 MG PO TABS
5.0000 mg | ORAL_TABLET | Freq: Every day | ORAL | 1 refills | Status: AC
  Filled 2024-10-02: qty 240, 90d supply, fill #0

## 2024-10-02 NOTE — Progress Notes (Signed)
 Description   INR-1.6; Take 3 tablets today then START taking warfarin 3 tablets (7.5mg ) daily except 2 tablets (5mg ) on Monday, Wednesday, and Friday.  Recheck INR in 2 weeks (6 weeks).    Anticoagulation Clinic [phone] Amio 1 tablet 200 mg daily-d/c June 2025

## 2024-10-02 NOTE — Patient Instructions (Signed)
 Description   INR-1.6; Take 3 tablets today then START taking warfarin 3 tablets (7.5mg ) daily except 2 tablets (5mg ) on Monday, Wednesday, and Friday.  Recheck INR in 2 weeks (6 weeks).    [facility] [phone] Amio 1 tablet 200 mg daily

## 2024-10-02 NOTE — Telephone Encounter (Signed)
 Warfarin 2.5mg  Dx-10/02/24 Last INR Check-10/02/24 Last OV- 09/18/24   Pt requests 90 day supply and states have not picked up 08/29/24 refill since 09/12/24 Called the pharmacy and spoke with pharm tech and asked to remove 30 day supply and fill new one and she discussed with Pharmacist and came back to the phone to update states will be able to do that and it will have to be ordered and will fill tomorrow pm. Advised patient is not out of med and has enough for this week and next week. Update patient by leaving a detailed voicemail that the med was sent to [facility] for 90 day supply.

## 2024-10-16 ENCOUNTER — Ambulatory Visit

## 2024-10-25 ENCOUNTER — Ambulatory Visit (HOSPITAL_BASED_OUTPATIENT_CLINIC_OR_DEPARTMENT_OTHER)

## 2024-10-26 ENCOUNTER — Ambulatory Visit

## 2024-12-25 ENCOUNTER — Ambulatory Visit: Admitting: Cardiology
